# Patient Record
Sex: Female | Born: 1940 | Race: White | Hispanic: No | Marital: Married | Smoking: Never smoker
Health system: Southern US, Community
[De-identification: ages and names within clinical notes are randomized; demographics above are authoritative.]

## PROBLEM LIST (undated history)

## (undated) DIAGNOSIS — Z9889 Other specified postprocedural states: Secondary | ICD-10-CM

## (undated) DIAGNOSIS — Z789 Other specified health status: Secondary | ICD-10-CM

## (undated) DIAGNOSIS — R112 Nausea with vomiting, unspecified: Secondary | ICD-10-CM

## (undated) DIAGNOSIS — T4145XA Adverse effect of unspecified anesthetic, initial encounter: Secondary | ICD-10-CM

## (undated) DIAGNOSIS — M1990 Unspecified osteoarthritis, unspecified site: Secondary | ICD-10-CM

## (undated) DIAGNOSIS — T8859XA Other complications of anesthesia, initial encounter: Secondary | ICD-10-CM

## (undated) DIAGNOSIS — D649 Anemia, unspecified: Secondary | ICD-10-CM

## (undated) DIAGNOSIS — K589 Irritable bowel syndrome without diarrhea: Secondary | ICD-10-CM

## (undated) DIAGNOSIS — I341 Nonrheumatic mitral (valve) prolapse: Secondary | ICD-10-CM

## (undated) DIAGNOSIS — Z923 Personal history of irradiation: Secondary | ICD-10-CM

## (undated) DIAGNOSIS — K219 Gastro-esophageal reflux disease without esophagitis: Secondary | ICD-10-CM

## (undated) DIAGNOSIS — N189 Chronic kidney disease, unspecified: Secondary | ICD-10-CM

## (undated) DIAGNOSIS — I10 Essential (primary) hypertension: Secondary | ICD-10-CM

## (undated) DIAGNOSIS — G2581 Restless legs syndrome: Secondary | ICD-10-CM

## (undated) DIAGNOSIS — Z906 Acquired absence of other parts of urinary tract: Secondary | ICD-10-CM

## (undated) DIAGNOSIS — I2699 Other pulmonary embolism without acute cor pulmonale: Secondary | ICD-10-CM

## (undated) DIAGNOSIS — Z8719 Personal history of other diseases of the digestive system: Secondary | ICD-10-CM

## (undated) DIAGNOSIS — C50919 Malignant neoplasm of unspecified site of unspecified female breast: Secondary | ICD-10-CM

## (undated) DIAGNOSIS — C679 Malignant neoplasm of bladder, unspecified: Secondary | ICD-10-CM

## (undated) HISTORY — DX: Personal history of irradiation: Z92.3

## (undated) HISTORY — PX: COLONOSCOPY: SHX174

## (undated) HISTORY — DX: Gastro-esophageal reflux disease without esophagitis: K21.9

## (undated) HISTORY — PX: REPLACEMENT TOTAL KNEE: SUR1224

## (undated) HISTORY — PX: TONSILLECTOMY: SUR1361

## (undated) HISTORY — PX: APPENDECTOMY: SHX54

## (undated) HISTORY — PX: BLADDER REMOVAL: SHX567

---

## 1978-05-30 HISTORY — PX: ABDOMINAL HYSTERECTOMY: SHX81

## 1983-05-31 DIAGNOSIS — C50919 Malignant neoplasm of unspecified site of unspecified female breast: Secondary | ICD-10-CM

## 1983-05-31 HISTORY — DX: Malignant neoplasm of unspecified site of unspecified female breast: C50.919

## 1998-09-23 ENCOUNTER — Other Ambulatory Visit: Admission: RE | Admit: 1998-09-23 | Discharge: 1998-09-23 | Payer: Self-pay | Admitting: Gynecology

## 1999-12-02 ENCOUNTER — Encounter: Payer: Self-pay | Admitting: Internal Medicine

## 1999-12-02 ENCOUNTER — Encounter: Admission: RE | Admit: 1999-12-02 | Discharge: 1999-12-02 | Payer: Self-pay | Admitting: Internal Medicine

## 1999-12-06 ENCOUNTER — Other Ambulatory Visit: Admission: RE | Admit: 1999-12-06 | Discharge: 1999-12-06 | Payer: Self-pay | Admitting: Internal Medicine

## 2001-03-19 ENCOUNTER — Encounter: Admission: RE | Admit: 2001-03-19 | Discharge: 2001-03-19 | Payer: Self-pay | Admitting: Internal Medicine

## 2001-03-19 ENCOUNTER — Encounter: Payer: Self-pay | Admitting: Internal Medicine

## 2002-06-06 ENCOUNTER — Encounter: Payer: Self-pay | Admitting: Internal Medicine

## 2002-06-06 ENCOUNTER — Encounter: Admission: RE | Admit: 2002-06-06 | Discharge: 2002-06-06 | Payer: Self-pay | Admitting: Internal Medicine

## 2003-05-31 DIAGNOSIS — Z923 Personal history of irradiation: Secondary | ICD-10-CM

## 2003-05-31 HISTORY — PX: BREAST LUMPECTOMY: SHX2

## 2003-05-31 HISTORY — PX: BREAST SURGERY: SHX581

## 2003-05-31 HISTORY — PX: BREAST BIOPSY: SHX20

## 2003-05-31 HISTORY — DX: Personal history of irradiation: Z92.3

## 2003-08-28 ENCOUNTER — Encounter: Admission: RE | Admit: 2003-08-28 | Discharge: 2003-08-28 | Payer: Self-pay | Admitting: Internal Medicine

## 2003-09-04 ENCOUNTER — Encounter: Admission: RE | Admit: 2003-09-04 | Discharge: 2003-09-04 | Payer: Self-pay | Admitting: Internal Medicine

## 2003-09-04 ENCOUNTER — Encounter (INDEPENDENT_AMBULATORY_CARE_PROVIDER_SITE_OTHER): Payer: Self-pay | Admitting: *Deleted

## 2003-09-09 ENCOUNTER — Encounter (HOSPITAL_COMMUNITY): Admission: RE | Admit: 2003-09-09 | Discharge: 2003-12-08 | Payer: Self-pay | Admitting: General Surgery

## 2003-09-18 ENCOUNTER — Encounter: Admission: RE | Admit: 2003-09-18 | Discharge: 2003-09-18 | Payer: Self-pay | Admitting: General Surgery

## 2003-09-22 ENCOUNTER — Encounter (INDEPENDENT_AMBULATORY_CARE_PROVIDER_SITE_OTHER): Payer: Self-pay | Admitting: *Deleted

## 2003-09-22 ENCOUNTER — Ambulatory Visit (HOSPITAL_BASED_OUTPATIENT_CLINIC_OR_DEPARTMENT_OTHER): Admission: RE | Admit: 2003-09-22 | Discharge: 2003-09-22 | Payer: Self-pay | Admitting: General Surgery

## 2003-09-22 ENCOUNTER — Ambulatory Visit (HOSPITAL_COMMUNITY): Admission: RE | Admit: 2003-09-22 | Discharge: 2003-09-22 | Payer: Self-pay | Admitting: General Surgery

## 2003-09-22 ENCOUNTER — Encounter: Admission: RE | Admit: 2003-09-22 | Discharge: 2003-09-22 | Payer: Self-pay | Admitting: General Surgery

## 2003-10-14 ENCOUNTER — Ambulatory Visit: Admission: RE | Admit: 2003-10-14 | Discharge: 2004-01-12 | Payer: Self-pay | Admitting: Radiation Oncology

## 2003-11-03 ENCOUNTER — Encounter: Admission: RE | Admit: 2003-11-03 | Discharge: 2003-11-03 | Payer: Self-pay | Admitting: *Deleted

## 2003-12-24 ENCOUNTER — Encounter: Admission: RE | Admit: 2003-12-24 | Discharge: 2003-12-24 | Payer: Self-pay | Admitting: Oncology

## 2004-02-12 ENCOUNTER — Ambulatory Visit: Admission: RE | Admit: 2004-02-12 | Discharge: 2004-02-12 | Payer: Self-pay | Admitting: Radiation Oncology

## 2004-06-18 ENCOUNTER — Ambulatory Visit: Payer: Self-pay | Admitting: Oncology

## 2004-07-09 ENCOUNTER — Ambulatory Visit: Payer: Self-pay | Admitting: Oncology

## 2004-08-30 ENCOUNTER — Encounter: Admission: RE | Admit: 2004-08-30 | Discharge: 2004-08-30 | Payer: Self-pay | Admitting: Internal Medicine

## 2005-01-20 ENCOUNTER — Ambulatory Visit: Payer: Self-pay | Admitting: Oncology

## 2005-07-07 ENCOUNTER — Ambulatory Visit: Payer: Self-pay | Admitting: Oncology

## 2005-09-01 ENCOUNTER — Encounter: Admission: RE | Admit: 2005-09-01 | Discharge: 2005-09-01 | Payer: Self-pay | Admitting: Oncology

## 2005-12-09 ENCOUNTER — Ambulatory Visit (HOSPITAL_BASED_OUTPATIENT_CLINIC_OR_DEPARTMENT_OTHER): Admission: RE | Admit: 2005-12-09 | Discharge: 2005-12-09 | Payer: Self-pay | Admitting: Orthopedic Surgery

## 2005-12-19 ENCOUNTER — Ambulatory Visit (HOSPITAL_COMMUNITY): Admission: RE | Admit: 2005-12-19 | Discharge: 2005-12-19 | Payer: Self-pay | Admitting: Orthopedic Surgery

## 2005-12-29 ENCOUNTER — Encounter: Admission: RE | Admit: 2005-12-29 | Discharge: 2005-12-29 | Payer: Self-pay | Admitting: Oncology

## 2006-01-04 ENCOUNTER — Ambulatory Visit: Payer: Self-pay | Admitting: Oncology

## 2006-01-06 LAB — COMPREHENSIVE METABOLIC PANEL
AST: 18 U/L (ref 0–37)
Albumin: 4.2 g/dL (ref 3.5–5.2)
Alkaline Phosphatase: 60 U/L (ref 39–117)
Potassium: 4.6 mEq/L (ref 3.5–5.3)
Sodium: 140 mEq/L (ref 135–145)
Total Protein: 7 g/dL (ref 6.0–8.3)

## 2006-01-06 LAB — LACTATE DEHYDROGENASE: LDH: 128 U/L (ref 94–250)

## 2006-01-06 LAB — CBC WITH DIFFERENTIAL/PLATELET
EOS%: 2.7 % (ref 0.0–7.0)
MCH: 32.3 pg (ref 26.0–34.0)
MCV: 94.2 fL (ref 81.0–101.0)
MONO%: 6.1 % (ref 0.0–13.0)
NEUT#: 4.3 10*3/uL (ref 1.5–6.5)
RBC: 4.22 10*6/uL (ref 3.70–5.32)
RDW: 13.8 % (ref 11.3–14.5)

## 2006-06-29 ENCOUNTER — Ambulatory Visit: Payer: Self-pay | Admitting: Oncology

## 2006-07-03 LAB — CBC WITH DIFFERENTIAL/PLATELET
BASO%: 0.3 % (ref 0.0–2.0)
EOS%: 4.7 % (ref 0.0–7.0)
HCT: 37.9 % (ref 34.8–46.6)
LYMPH%: 35.1 % (ref 14.0–48.0)
MCH: 32.2 pg (ref 26.0–34.0)
MCHC: 35.3 g/dL (ref 32.0–36.0)
MONO%: 5.8 % (ref 0.0–13.0)
NEUT%: 54.1 % (ref 39.6–76.8)
Platelets: 405 10*3/uL — ABNORMAL HIGH (ref 145–400)
lymph#: 2.3 10*3/uL (ref 0.9–3.3)

## 2006-07-03 LAB — COMPREHENSIVE METABOLIC PANEL
ALT: 19 U/L (ref 0–35)
AST: 18 U/L (ref 0–37)
Alkaline Phosphatase: 57 U/L (ref 39–117)
Creatinine, Ser: 0.98 mg/dL (ref 0.40–1.20)
Total Bilirubin: 0.4 mg/dL (ref 0.3–1.2)

## 2006-09-04 ENCOUNTER — Encounter: Admission: RE | Admit: 2006-09-04 | Discharge: 2006-09-04 | Payer: Self-pay | Admitting: Oncology

## 2006-09-04 ENCOUNTER — Ambulatory Visit (HOSPITAL_COMMUNITY): Admission: RE | Admit: 2006-09-04 | Discharge: 2006-09-04 | Payer: Self-pay | Admitting: Oncology

## 2007-01-02 ENCOUNTER — Ambulatory Visit: Payer: Self-pay | Admitting: Oncology

## 2007-01-04 LAB — CBC WITH DIFFERENTIAL/PLATELET
BASO%: 0.8 % (ref 0.0–2.0)
EOS%: 8.2 % — ABNORMAL HIGH (ref 0.0–7.0)
LYMPH%: 30.5 % (ref 14.0–48.0)
MCHC: 35.1 g/dL (ref 32.0–36.0)
MCV: 91 fL (ref 81.0–101.0)
MONO%: 3.7 % (ref 0.0–13.0)
Platelets: 374 10*3/uL (ref 145–400)
RBC: 4.24 10*6/uL (ref 3.70–5.32)
RDW: 13.5 % (ref 11.3–14.5)

## 2007-01-04 LAB — COMPREHENSIVE METABOLIC PANEL
ALT: 30 U/L (ref 0–35)
AST: 22 U/L (ref 0–37)
Alkaline Phosphatase: 73 U/L (ref 39–117)
Sodium: 140 mEq/L (ref 135–145)
Total Bilirubin: 0.4 mg/dL (ref 0.3–1.2)
Total Protein: 7 g/dL (ref 6.0–8.3)

## 2007-01-04 LAB — CANCER ANTIGEN 27.29: CA 27.29: 17 U/mL (ref 0–39)

## 2007-05-16 ENCOUNTER — Inpatient Hospital Stay (HOSPITAL_COMMUNITY): Admission: RE | Admit: 2007-05-16 | Discharge: 2007-05-20 | Payer: Self-pay | Admitting: Orthopedic Surgery

## 2007-05-31 HISTORY — PX: BLADDER SURGERY: SHX569

## 2007-07-09 ENCOUNTER — Ambulatory Visit: Payer: Self-pay | Admitting: Oncology

## 2007-07-09 LAB — CBC WITH DIFFERENTIAL/PLATELET
Basophils Absolute: 0 10*3/uL (ref 0.0–0.1)
Eosinophils Absolute: 0.2 10*3/uL (ref 0.0–0.5)
HCT: 38.2 % (ref 34.8–46.6)
HGB: 12.6 g/dL (ref 11.6–15.9)
MCV: 91.2 fL (ref 81.0–101.0)
MONO%: 4.6 % (ref 0.0–13.0)
NEUT#: 3.7 10*3/uL (ref 1.5–6.5)
NEUT%: 62.5 % (ref 39.6–76.8)
Platelets: 537 10*3/uL — ABNORMAL HIGH (ref 145–400)
RDW: 13.7 % (ref 11.3–14.5)

## 2007-07-10 LAB — COMPREHENSIVE METABOLIC PANEL
Albumin: 4.5 g/dL (ref 3.5–5.2)
Alkaline Phosphatase: 72 U/L (ref 39–117)
BUN: 15 mg/dL (ref 6–23)
Calcium: 9.8 mg/dL (ref 8.4–10.5)
Creatinine, Ser: 0.8 mg/dL (ref 0.40–1.20)
Glucose, Bld: 120 mg/dL — ABNORMAL HIGH (ref 70–99)
Potassium: 4 mEq/L (ref 3.5–5.3)

## 2007-07-10 LAB — CANCER ANTIGEN 27.29: CA 27.29: 16 U/mL (ref 0–39)

## 2007-07-10 LAB — VITAMIN D 25 HYDROXY (VIT D DEFICIENCY, FRACTURES): Vit D, 25-Hydroxy: 33 ng/mL (ref 30–89)

## 2007-09-05 ENCOUNTER — Encounter: Admission: RE | Admit: 2007-09-05 | Discharge: 2007-09-05 | Payer: Self-pay | Admitting: Oncology

## 2007-12-31 ENCOUNTER — Encounter: Admission: RE | Admit: 2007-12-31 | Discharge: 2007-12-31 | Payer: Self-pay | Admitting: Oncology

## 2007-12-31 ENCOUNTER — Ambulatory Visit: Payer: Self-pay | Admitting: Oncology

## 2008-01-02 LAB — CBC WITH DIFFERENTIAL/PLATELET
BASO%: 0.4 % (ref 0.0–2.0)
LYMPH%: 29.7 % (ref 14.0–48.0)
MCHC: 34.3 g/dL (ref 32.0–36.0)
MCV: 92.8 fL (ref 81.0–101.0)
MONO%: 4.3 % (ref 0.0–13.0)
Platelets: 376 10*3/uL (ref 145–400)
RBC: 4.12 10*6/uL (ref 3.70–5.32)
RDW: 13.5 % (ref 11.3–14.5)
WBC: 5.8 10*3/uL (ref 3.9–10.0)

## 2008-01-03 LAB — COMPREHENSIVE METABOLIC PANEL
ALT: 36 U/L — ABNORMAL HIGH (ref 0–35)
AST: 27 U/L (ref 0–37)
Albumin: 4.2 g/dL (ref 3.5–5.2)
Alkaline Phosphatase: 68 U/L (ref 39–117)
Potassium: 4.3 mEq/L (ref 3.5–5.3)
Sodium: 142 mEq/L (ref 135–145)
Total Bilirubin: 0.4 mg/dL (ref 0.3–1.2)
Total Protein: 7.1 g/dL (ref 6.0–8.3)

## 2008-01-31 ENCOUNTER — Encounter: Payer: Self-pay | Admitting: Urology

## 2008-01-31 ENCOUNTER — Ambulatory Visit (HOSPITAL_BASED_OUTPATIENT_CLINIC_OR_DEPARTMENT_OTHER): Admission: RE | Admit: 2008-01-31 | Discharge: 2008-02-01 | Payer: Self-pay | Admitting: Urology

## 2008-02-21 ENCOUNTER — Ambulatory Visit (HOSPITAL_BASED_OUTPATIENT_CLINIC_OR_DEPARTMENT_OTHER): Admission: RE | Admit: 2008-02-21 | Discharge: 2008-02-21 | Payer: Self-pay | Admitting: Urology

## 2008-02-21 ENCOUNTER — Encounter: Payer: Self-pay | Admitting: Urology

## 2008-04-21 ENCOUNTER — Encounter: Payer: Self-pay | Admitting: Urology

## 2008-04-21 ENCOUNTER — Inpatient Hospital Stay (HOSPITAL_COMMUNITY): Admission: RE | Admit: 2008-04-21 | Discharge: 2008-04-28 | Payer: Self-pay | Admitting: Urology

## 2008-06-30 ENCOUNTER — Ambulatory Visit: Payer: Self-pay | Admitting: Oncology

## 2008-07-02 LAB — COMPREHENSIVE METABOLIC PANEL
ALT: 30 U/L (ref 0–35)
AST: 19 U/L (ref 0–37)
Albumin: 4.2 g/dL (ref 3.5–5.2)
BUN: 25 mg/dL — ABNORMAL HIGH (ref 6–23)
CO2: 19 mEq/L (ref 19–32)
Calcium: 9.4 mg/dL (ref 8.4–10.5)
Chloride: 105 mEq/L (ref 96–112)
Creatinine, Ser: 0.99 mg/dL (ref 0.40–1.20)
Potassium: 4.3 mEq/L (ref 3.5–5.3)

## 2008-07-02 LAB — CBC WITH DIFFERENTIAL/PLATELET
Basophils Absolute: 0 10*3/uL (ref 0.0–0.1)
EOS%: 5.7 % (ref 0.0–7.0)
HCT: 34 % — ABNORMAL LOW (ref 34.8–46.6)
HGB: 11.4 g/dL — ABNORMAL LOW (ref 11.6–15.9)
MCH: 30.3 pg (ref 26.0–34.0)
MONO#: 0.5 10*3/uL (ref 0.1–0.9)
NEUT#: 5 10*3/uL (ref 1.5–6.5)
NEUT%: 62 % (ref 39.6–76.8)
RDW: 15.6 % — ABNORMAL HIGH (ref 11.3–14.5)
WBC: 8.1 10*3/uL (ref 3.9–10.0)
lymph#: 2.1 10*3/uL (ref 0.9–3.3)

## 2008-07-02 LAB — LACTATE DEHYDROGENASE: LDH: 106 U/L (ref 94–250)

## 2008-09-05 ENCOUNTER — Encounter: Admission: RE | Admit: 2008-09-05 | Discharge: 2008-09-05 | Payer: Self-pay | Admitting: Oncology

## 2008-09-08 ENCOUNTER — Encounter: Admission: RE | Admit: 2008-09-08 | Discharge: 2008-09-08 | Payer: Self-pay | Admitting: Oncology

## 2008-12-29 ENCOUNTER — Ambulatory Visit: Payer: Self-pay | Admitting: Oncology

## 2008-12-31 LAB — CBC WITH DIFFERENTIAL/PLATELET
Basophils Absolute: 0 10*3/uL (ref 0.0–0.1)
EOS%: 5.5 % (ref 0.0–7.0)
Eosinophils Absolute: 0.4 10*3/uL (ref 0.0–0.5)
HGB: 11.8 g/dL (ref 11.6–15.9)
LYMPH%: 26.7 % (ref 14.0–49.7)
MCH: 31.3 pg (ref 25.1–34.0)
MCV: 91.8 fL (ref 79.5–101.0)
MONO%: 3.6 % (ref 0.0–14.0)
Platelets: 484 10*3/uL — ABNORMAL HIGH (ref 145–400)
RDW: 13.6 % (ref 11.2–14.5)

## 2009-01-01 LAB — COMPREHENSIVE METABOLIC PANEL
AST: 14 U/L (ref 0–37)
Albumin: 4.2 g/dL (ref 3.5–5.2)
Alkaline Phosphatase: 73 U/L (ref 39–117)
BUN: 27 mg/dL — ABNORMAL HIGH (ref 6–23)
Creatinine, Ser: 1.21 mg/dL — ABNORMAL HIGH (ref 0.40–1.20)
Glucose, Bld: 129 mg/dL — ABNORMAL HIGH (ref 70–99)
Potassium: 4.7 mEq/L (ref 3.5–5.3)
Total Bilirubin: 0.3 mg/dL (ref 0.3–1.2)

## 2009-01-01 LAB — CANCER ANTIGEN 27.29: CA 27.29: 22 U/mL (ref 0–39)

## 2009-01-01 LAB — VITAMIN D 25 HYDROXY (VIT D DEFICIENCY, FRACTURES): Vit D, 25-Hydroxy: 50 ng/mL (ref 30–89)

## 2009-03-26 ENCOUNTER — Ambulatory Visit (HOSPITAL_COMMUNITY): Admission: RE | Admit: 2009-03-26 | Discharge: 2009-03-26 | Payer: Self-pay | Admitting: Urology

## 2009-07-07 ENCOUNTER — Ambulatory Visit (HOSPITAL_BASED_OUTPATIENT_CLINIC_OR_DEPARTMENT_OTHER): Payer: MEDICARE | Admitting: Oncology

## 2009-07-09 LAB — CBC WITH DIFFERENTIAL/PLATELET
BASO%: 0.5 % (ref 0.0–2.0)
Basophils Absolute: 0 10*3/uL (ref 0.0–0.1)
EOS%: 5.3 % (ref 0.0–7.0)
HCT: 36.9 % (ref 34.8–46.6)
HGB: 12.5 g/dL (ref 11.6–15.9)
LYMPH%: 31.7 % (ref 14.0–49.7)
MCH: 31.6 pg (ref 25.1–34.0)
MCHC: 33.8 g/dL (ref 31.5–36.0)
MCV: 93.3 fL (ref 79.5–101.0)
NEUT%: 55.8 % (ref 38.4–76.8)
Platelets: 357 10*3/uL (ref 145–400)
lymph#: 2.3 10*3/uL (ref 0.9–3.3)

## 2009-07-09 LAB — COMPREHENSIVE METABOLIC PANEL
AST: 15 U/L (ref 0–37)
BUN: 27 mg/dL — ABNORMAL HIGH (ref 6–23)
Calcium: 9.5 mg/dL (ref 8.4–10.5)
Chloride: 103 mEq/L (ref 96–112)
Creatinine, Ser: 1.02 mg/dL (ref 0.40–1.20)
Total Bilirubin: 0.2 mg/dL — ABNORMAL LOW (ref 0.3–1.2)

## 2009-07-09 LAB — LACTATE DEHYDROGENASE: LDH: 104 U/L (ref 94–250)

## 2009-07-30 ENCOUNTER — Ambulatory Visit (HOSPITAL_COMMUNITY): Admission: RE | Admit: 2009-07-30 | Discharge: 2009-07-30 | Payer: Self-pay | Admitting: Urology

## 2009-09-07 ENCOUNTER — Encounter: Admission: RE | Admit: 2009-09-07 | Discharge: 2009-09-07 | Payer: Self-pay | Admitting: Oncology

## 2009-11-03 ENCOUNTER — Ambulatory Visit (HOSPITAL_COMMUNITY): Admission: RE | Admit: 2009-11-03 | Discharge: 2009-11-03 | Payer: Self-pay | Admitting: Urology

## 2010-06-20 ENCOUNTER — Encounter: Payer: Self-pay | Admitting: General Surgery

## 2010-07-05 ENCOUNTER — Encounter: Payer: MEDICARE | Admitting: Oncology

## 2010-07-05 DIAGNOSIS — C679 Malignant neoplasm of bladder, unspecified: Secondary | ICD-10-CM

## 2010-07-05 LAB — CBC WITH DIFFERENTIAL/PLATELET
Eosinophils Absolute: 0.1 10*3/uL (ref 0.0–0.5)
HCT: 37.6 % (ref 34.8–46.6)
MCH: 30.9 pg (ref 25.1–34.0)
MCHC: 33.8 g/dL (ref 31.5–36.0)
MCV: 91.5 fL (ref 79.5–101.0)
NEUT#: 4.5 10*3/uL (ref 1.5–6.5)
NEUT%: 65.2 % (ref 38.4–76.8)
Platelets: 344 10*3/uL (ref 145–400)
RBC: 4.11 10*6/uL (ref 3.70–5.45)
RDW: 14 % (ref 11.2–14.5)
WBC: 6.9 10*3/uL (ref 3.9–10.3)
lymph#: 1.8 10*3/uL (ref 0.9–3.3)

## 2010-07-06 LAB — COMPREHENSIVE METABOLIC PANEL
AST: 19 U/L (ref 0–37)
Albumin: 4.1 g/dL (ref 3.5–5.2)
BUN: 21 mg/dL (ref 6–23)
Calcium: 9.5 mg/dL (ref 8.4–10.5)
Chloride: 106 mEq/L (ref 96–112)
Sodium: 139 mEq/L (ref 135–145)
Total Protein: 6.9 g/dL (ref 6.0–8.3)

## 2010-07-06 LAB — VITAMIN D 25 HYDROXY (VIT D DEFICIENCY, FRACTURES): Vit D, 25-Hydroxy: 29 ng/mL — ABNORMAL LOW (ref 30–89)

## 2010-07-06 LAB — CANCER ANTIGEN 27.29: CA 27.29: 20 U/mL (ref 0–39)

## 2010-07-16 ENCOUNTER — Other Ambulatory Visit: Payer: Self-pay | Admitting: Oncology

## 2010-07-16 ENCOUNTER — Encounter (HOSPITAL_BASED_OUTPATIENT_CLINIC_OR_DEPARTMENT_OTHER): Payer: MEDICARE | Admitting: Oncology

## 2010-07-16 DIAGNOSIS — C679 Malignant neoplasm of bladder, unspecified: Secondary | ICD-10-CM

## 2010-07-16 DIAGNOSIS — C50319 Malignant neoplasm of lower-inner quadrant of unspecified female breast: Secondary | ICD-10-CM

## 2010-07-16 DIAGNOSIS — Z9889 Other specified postprocedural states: Secondary | ICD-10-CM

## 2010-08-05 ENCOUNTER — Ambulatory Visit (HOSPITAL_COMMUNITY)
Admission: RE | Admit: 2010-08-05 | Discharge: 2010-08-05 | Disposition: A | Discharge status: Home / Self Care | Payer: MEDICARE | Source: Ambulatory Visit | Attending: Urology | Admitting: Urology

## 2010-08-05 ENCOUNTER — Other Ambulatory Visit: Payer: Self-pay | Admitting: Urology

## 2010-08-05 DIAGNOSIS — Z8551 Personal history of malignant neoplasm of bladder: Secondary | ICD-10-CM | POA: Insufficient documentation

## 2010-08-05 DIAGNOSIS — C679 Malignant neoplasm of bladder, unspecified: Secondary | ICD-10-CM

## 2010-08-05 DIAGNOSIS — Z79899 Other long term (current) drug therapy: Secondary | ICD-10-CM | POA: Insufficient documentation

## 2010-08-05 DIAGNOSIS — Z85528 Personal history of other malignant neoplasm of kidney: Secondary | ICD-10-CM | POA: Insufficient documentation

## 2010-08-05 DIAGNOSIS — I10 Essential (primary) hypertension: Secondary | ICD-10-CM | POA: Insufficient documentation

## 2010-09-14 ENCOUNTER — Ambulatory Visit
Admission: RE | Admit: 2010-09-14 | Discharge: 2010-09-14 | Disposition: A | Discharge status: Home / Self Care | Payer: MEDICARE | Source: Ambulatory Visit | Attending: Oncology | Admitting: Oncology

## 2010-09-14 DIAGNOSIS — Z9889 Other specified postprocedural states: Secondary | ICD-10-CM

## 2010-10-12 NOTE — Discharge Summary (Signed)
NAME:  ILO, BEAMON                    ACCOUNT NO.:  [id number]   MEDICAL RECORD NO.:  [id number]          PATIENT TYPE:  INP   LOCATION:  5007                         FACILITY:  [facility]   PHYSICIAN:  Harvie Junior, M.D.   DATE OF BIRTH:  1941-03-23   DATE OF ADMISSION:  05/16/2007  DATE OF DISCHARGE:  05/20/2007                               DISCHARGE SUMMARY   ADMISSION DIAGNOSES:  1. End-stage degenerative joint disease, left knee.  2. Hypertension.  3. History of mitral valve prolapse.  4. History of breast cancer in 1985.   DISCHARGE DIAGNOSES:  1. End-stage degenerative joint disease, left knee.  2. Hypertension.  3. History of mitral valve prolapse.  4. History of breast cancer in 1985.   BRIEF HISTORY:  Ms. An is a pleasant 70 year old female with a long  history of left knee pain.  She has pain with ambulation and night pain.  X-rays of the left knee show that she has bone-on-bone degenerative  arthritis with spurring.  She only got temporary relief with cortisone  injections, modification of activities and use of medication.  She had  continued difficulty with activities of daily living and based upon her  clinical and radiographic findings was felt to be a candidate for a left  total knee arthroplasty.  She was admitted for this.   PROCEDURES IN THE HOSPITAL:  Left total knee arthroplasty, computer-  assisted, Jodi Geralds MD, May 16, 2007.   PERTINENT LABORATORY STUDIES:  The patient's EKG on admission showed  normal sinus rhythm, consider septal infarct, age undetermined,  otherwise normal EKG.  No significant change compared with the previous  EKG of December 08, 2005.  Preoperative chest x-ray showed no acute disease.  Hemoglobin on admission was 14.2, hematocrit 41.2, indices within normal  limits.  On postop day #1, hemoglobin was 11, #2 10.8, #3 10.4.  Pro  time on admission was 13.3 with an INR of 1.0, PTT was 36.  On the day  of discharge on Coumadin, her INR  was 2.4 with a pro time of 26.8  seconds.  CMET on admission showed no abnormalities.  BMET on postop day  #1 was also within normal limits other than slightly elevated glucose of  185.  Urinalysis showed few epithelials and small leukocyte esterase,  with cloudy appearance, 0-3 wbc's and rbc's per high-powered field.   HOSPITAL COURSE:  The patient underwent left total knee arthroplasty as  well-described in Dr. Luiz Blare' operative note.  Preoperatively she was  given IV gentamicin 80 mg and IV Ancef 1 g.  Postoperatively she given 1  g of Ancef q.8 h. x3 doses prophylactically.  She was started on a PCA  morphine pump for pain control and physical therapy ordered for walker  ambulation, weightbearing as tolerated on the left.  CPM machine was  used for range of motion.  The Foley catheter that had been placed at  the time of surgery was left intact.  On postop day #1 she had good  relief of pain from her femoral nerve block which had  been done  preoperatively, her vital signs were stable and she was afebrile.  Her  laboratory data looked great with a stable hemoglobin of 11.1 and INR of  1.0.  She was gotten out of bed with physical therapy, weightbearing as  tolerated on the left side.  She continued with the CPM machine as well  for knee range of motion.  On postop day #2 she had some complaints of  knee pain.  She was taking fluids and voiding without difficulty.  Her  Foley catheter had been removed.  Her hemoglobin was 10.8.  Her vital  signs were stable and her temperature was 99.5.  Her INR was 1.5.  her  IV was converted to a saline lock.  Her PCA morphine pump was  discontinued.  Her dressing was changed.  Hemovac drain was pulled  without complications.  On postop day #3 she still had complaints of  some moderate knee pain.  She did not do as well in physical therapy  that we felt she could be discharged home, but she did make good  progress on that day.  On May 20, 2007,  she was doing well, her  pain was well-controlled, her vital signs were stable, she is afebrile,  her hemoglobin was 10.4, her INR was 2.4.  Her wound was benign, her  calf was soft, NV was intact to her left lower extremity.   CONDITION ON DISCHARGE:  Improved.   ACTIVITY:  Passively weightbearing as tolerated on the left with a  walker.  Seeing home health physical therapy three times a week for  walker ambulation and knee range of motion.  CPM machine for knee range  of motion as well, 8 hours per 24 hours, and home health RN for pro  times and Coumadin management x1 month postop for DVT prophylaxis.   She is given a prescription for:  1. Percocet 5 mg one to take q.6 h. p.r.n. pain.  2. Robaxin 500 mg one q.8 h. p.r.n. spasm.  3. Coumadin per pharmacy protocol x1 month.  4. Postop she will also continue on her usual medications.   Her saline lock was discontinued prior to discharge.  She will follow up  Dr. Luiz Blare in the office 10 days, sooner if any problems occur.      Marshia Ly, P.A.      Harvie Junior, M.D.  Electronically Signed    JB/MEDQ  D:  07/16/2007  T:  07/17/2007  Job:  [id number]   cc:   Cassell Clement, M.D.  Larina Earthly, M.D.

## 2010-10-12 NOTE — Op Note (Signed)
NAME:  Caitlin [name], Caitlin [name]                    ACCOUNT NO.:  [id number]   MEDICAL RECORD NO.:  [id number]          PATIENT TYPE:  INP   LOCATION:  0001                         FACILITY:  [facility]   PHYSICIAN:  Bertram Millard. Dahlstedt, M.D.DATE OF BIRTH:  1940/10/06   DATE OF PROCEDURE:  04/21/2008  DATE OF DISCHARGE:                               OPERATIVE REPORT   PREOPERATIVE DIAGNOSIS:  Muscle invasive bladder cancer, sarcomatoid  variant.   POSTOPERATIVE DIAGNOSIS:  Muscle invasive bladder cancer, sarcomatoid  variant.   PRINCIPAL PROCEDURE:  Creation of ileal neobladder   CO-SURGEON:  Excell Seltzer. Annabell Howells, M.D.   ANESTHESIA:  General endotracheal.   COMPLICATIONS:  None.   ESTIMATED BLOOD LOSS:  350 mL.   SPECIMENS:  None.   BRIEF HISTORY:  This lady is undergoing a radical cystectomy, bilateral  pelvic lymph node dissection and creation of urinary diversion.  She  prefers a neobladder as her form of permanent urinary diversion.  She  does have normal renal function.  She has had a TURBT plus rebiopsy of  the base of the tumor.  She had a carcinoma with sarcomatoid variant.  There is no evidence of metastatic spread.  She presents at this time  for the cystectomy.  She is aware of risks and complications of the  cystectomy and possible neobladder, including infection, bleeding, leak  of urine, stricture of the ureters, incontinence, retention, DVT, PE,  infections, among others.  She understands and she desires to proceed.   DESCRIPTION OF PROCEDURE:  The patient had previously undergone the  cystectomy, preservation of vagina and bilateral pelvic lymph node  dissection.  This was dictated separately by Dr. Bjorn Pippin.   Following completion of the above-mentioned procedure, and the  neobladder was performed.  A cuff of the bladder neck was left, with a  margin showing no evidence of cancer.  Then 60 cm of the distal ileum  was measured, and the mesentery was transected at the proximal  and  distal extents of the 60 - cm  ileal segment.  The ileum was then  divided with the GIA stapler.  The remaining ileum was then  reanastomosed above the ileum to be used as the neobladder, using the  GIA and TA stapler, in a side-to-side functional end-to-end fashion.  The mesenteric window was closed with 3-0 pop-off silk sutures.   The ileum to be used as the neobladder was then inspected.  The  antimesenteric border was divided from proximal to distal extents, and  the stapled end was resected.  Then 15 cm from the end of the ileal  segment, the neobladder was created by running 3-0 PDS suture, creating  a tubular neobladder - like tubular neo-urethra, both in the anterior  and posterior extents of this segment.  That left 15 cm on the distal  side, 45 cm of the ileum on the proximal side.  The proximal side was  then spiraled around to form the posterior plate of the neobladder.  Two  suture lines were then run, closing the opposing sides with 3-0 PDS  placed in a simple running fashion.  This created the entire posterior  plate of the neobladder.   The ureters were then identified, spatulated, and brought through the  mid-aspect of the posterior lateral edges of the respective neobladder.  They were sutured to the internal aspect of the mucosal edge of the  neobladder using 4-0 PDS placed in a simple interrupted fashion  circumferentially.  This left wide - open anastomoses, and a 7- French  x 24 cm contour stent was passed up into the renal pelves bilaterally,  with the end of the stent being in the neobladder.  The strings were  left on the end for eventual suturing to the Foley catheter.  The  anastomotic sutures that had been previously placed at the bladder neck,  and five different sutures, were then brought through the bottom edge of  the neo-urethra, placed circumferentially in the corresponding spots.  A  24-French hematuria catheter was then brought through the  urethra, the  balloon filled with 20 mL of water.  The strings from the stents were  tied to the holes of the catheter.  The anterior plate of the neobladder  was then formed, with the superior edge of the neobladder flipped down.   The neobladder was then closed using in 2 running sutures from the from  the midline running laterally of 3-0 PDS.  Care was taken not to injure  the balloon from the catheter or to hook the sutures from the stent.  Following closure of the neobladder, approximately 100 mL of saline was  placed up through the catheter.  No leakage was seen.  The anastomotic  sutures were then tied and cut.  Inspection of the pelvis revealed  adequate hemostasis.  The redundant omentum was then divided in the  midline, and then brought down the left paracolic gutter and placed  behind the neobladder.  A drain was then placed anterior to the  neobladder through a separate stab incision in the right lower quadrant.  It was sutured to the skin.  The pelvis was irrigated.  Sponge counts  were correct at this point, before closure of the wound.  The fascia was  reapproximated using #1 PDS placed in a simple running fashion.  Skin  edges were reapproximated using skin clips.   At this point the procedure was terminated.  The patient was awakened  and taken to PACU in stable condition.  Sponge, needle, and instrument  counts were correct x2.  Estimated blood loss was 350 mL.      Bertram Millard. Dahlstedt, M.D.  Electronically Signed     SMD/MEDQ  D:  04/21/2008  T:  04/21/2008  Job:  [id number]

## 2010-10-12 NOTE — Op Note (Signed)
NAME:  Caitlin [name], Caitlin [name]                    ACCOUNT NO.:  [id number]   MEDICAL RECORD NO.:  [id number]          PATIENT TYPE:  INP   LOCATION:  1234                         FACILITY:  [facility]   PHYSICIAN:  Excell Seltzer. Annabell Howells, M.D.    DATE OF BIRTH:  1940/08/18   DATE OF PROCEDURE:  04/21/2008  DATE OF DISCHARGE:                               OPERATIVE REPORT   PROCEDURE:  Radical cystectomy with bilateral lymphadenectomy.   PREOPERATIVE DIAGNOSIS:  Bladder cancer.   POSTOPERATIVE DIAGNOSIS:  Bladder cancer.   SURGEON:  Dr. Bjorn Pippin.   ASSISTANT:  Dr. Bertram Millard. Dahlstedt.   ANESTHESIA:  General.   SPECIMEN:  Bladder and right and left pelvic lymph nodes.   ESTIMATED BLOOD LOSS:  350 mL.   DRAIN:  A #10 flat Blake drain and Foley catheter with bilateral  ureteral stents.   COMPLICATIONS:  None.   INDICATIONS:  Ms. 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  Invasion and was not apparent and a re-  resection was performed which revealed no residual tumor.  However,  sarcomatoid variant bladder cancer has a very aggressive history and  after review with the patient and a second opinion at [facility], we  elected to proceed with cystectomy with bilateral pelvic  lymphadenectomy.  She has seen Dr. Retta Diones about diversion options,  and he will perform a neobladder if possible at the completion of the  cystectomy.   FINDINGS AND PROCEDURE:  The patient was given preoperative bowel prep  with erythromycin base and neomycin and Golytely.  On the day of surgery  she was fitted with PAS hose and given Ancef.  She was taken operating  room where general anesthetic was induced.  She was placed in the low  lithotomy position.  Her lower abdomen was shaved.  She was prepped both  abdominally and vaginally with Betadine and then draped in the usual  sterile fashion.  A 20-French Foley catheter  was inserted.  The balloon  was filled with 10 mL of sterile fluid and the bladder was drained.  A  lower midline incision was made from the pubis to just lateral to the  umbilicus on the left.  She had been marked for stoma on the right if  needed.  The incision was carried down through the subcutaneous tissue  and fascia using the Bovie.  She did have a prior Pfannenstiel and that  scar was encountered in the lower margin of the wound.  Once the rectus  muscle was identified the midline was noted.  The posterior sheath that  was then divided and the peritoneal cavity was opened.  The urachal  remnant was excised from the umbilicus along with its overlying  peritoneum down to the lateral aspects of the bladder.  This was grasped  with a Tresa Endo and used to provide traction on the bladder.  The  peritoneum overlying the bladder was incised posteriorly on both sides  down to the level of the  ureters which were identified.  First on the  left, the ureter was dissected out and clipped near the detrusor hiatus  with large clips and a section was sent for frozen section analysis.  This returned negative.  The right ureter was then dissected out and  clipped in identical fashion, once again with a small specimen being  sent for frozen section analysis which returned negative.  The  peritoneum was then incised transversely after the vaginal vault had  been identified using a sponge stick with a sponge.  Because of the  desire to preserve the bladder neck and reduce the risk of fistula  formation, the bladder was dissected off the anterior vaginal wall.  The  lateral pedicles were taken down using the LigaSure and the bladder was  gradually dissected off the vaginal vault down to the level of the  bladder neck and proximal urethra.  The pubourethral ligaments were then  divided using the Bovie, further freeing the bladder neck.  Once the  urethra was felt to be sufficiently circumscribed at the  level of the  bladder neck it was divided.  The specimen was removed.  The bladder  neck margin was analyzed by frozen section and was found to be negative.   Once the bladder was removed, some venous bleeding in the pelvis was  controlled with cautery and we turned our attention to lymph node  dissection.  During the bladder dissection, there was a 3-cm rent in the  right side of the anterior vaginal wall that was identified.  This was  closed using a running 2-0 Vicryl.  The vaginal repair was tested with  vaginal irrigation without leakage.   The right lymph node dissection was performed with the margins of the  dissection being the lateral margin of the external iliac vein, the  obturator nerve, the circumflex iliac vein and the bifurcation of the  hypogastric artery.  We did take the dissection somewhat more proximal  than just the limited by obturator node dissection.  The proximal end of  the lymph node packet was controlled with LigaSure.  The packet was sent  for permanent section.  Once the right packet had been dissected out,  the left packet was removed in an identical fashion, once again with the  same limits and care being taken to avoid injury to the obturator nerve.  No obvious gross nodal involvement was noted.   Once the lymph node dissection had been performed, Dr. Retta Diones created  the neobladder along with the ureteral anastomoses and urethral  anastomosis.  Once this had been completed, the wound was irrigated and  inspected.  No significant bleeding was noted.  A #10 flat Blake drain  was placed through a separate stab wound lateral to the right side of  the incision.  A 24-French Simplastic Foley had been used with the  neobladder along with bilateral 7-French 26 cm double-J stents which  were secured to the Foley.  The rectus fascia was then closed using a  running #1 PDS.  The subcutaneous tissues were irrigated.  The skin was  closed with clips.  The drain  was placed to bulb suction, the Foley to  straight drainage.  A dressing was applied.  The patient's was taken  down from lithotomy position.  Her anesthetic was reversed.  She was  moved to the recovery room in stable condition.      Excell Seltzer. Annabell Howells, M.D.  Electronically Signed     JJW/MEDQ  D:  04/21/2008  T:  04/21/2008  Job:  [id number]   cc:   Pierce Crane, MD  Fax: [phone]   Larina Earthly, M.D.  Fax: [phone]

## 2010-10-12 NOTE — Op Note (Signed)
NAME:  Caitlin [name], Caitlin [name]                    ACCOUNT NO.:  [id number]   MEDICAL RECORD NO.:  [id number]          PATIENT TYPE:  AMB   LOCATION:  NESC                         FACILITY:  [facility]   PHYSICIAN:  Excell Seltzer. Annabell Howells, M.D.    DATE OF BIRTH:  07/31/1940   DATE OF PROCEDURE:  01/31/2008  DATE OF DISCHARGE:                               OPERATIVE REPORT   PROCEDURE:  Cystoscopy, transurethral resection of large bladder tumor,  exam under anesthesia.   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   SURGEON:  Excell Seltzer. Annabell Howells, M.D.   ANESTHESIA:  General.   SPECIMEN:  1. Bladder tumor.  2. Base of bladder tumor.   DRAINS:  20 French Foley catheter.   COMPLICATIONS:  None.   INDICATIONS:  Ms. Stensland is a 70 year old white female who presented with  voiding symptoms and hematuria.  She was found to have a 3.9 x 3 cm  bladder mass on CT scan.  This was confirmed by cystoscopy as a probable  bladder tumor.  She also had a palpable mass on exam.  She presents  today for resection.   FINDINGS AND PROCEDURE:  The patient was given Cipro.  She was fitted  with PAS hose.  She was taken to the operating room where general  anesthetic was induced.  She was placed in lithotomy position.  Her  perineum and genitalia were prepped with Betadine solution.  She was  draped in the usual sterile fashion.   A 28 French continuous flow resectoscope sheath was inserted, fitted  with a 12 degree lens, Iglesias handle and 24 [place] loop.  Inspection  revealed unremarkable ureteral orifices although lateral to the right  ureteral orifice was some erythema that was also present on the right  bladder neck.  It appeared to be more inflammatory from the local  effects of her large bladder tumor.  The left ureteral orifice was  unremarkable.  However,  there was a small papillary tumor just lateral  to the orifice.  There was also a small papillary tumor on the posterior  wall but on the left lateral wall there was the large tumor noted on CT  scan that had a coating of necrotic material with probable calcium  deposits.  At the base the tumor there was more of papillary component  which made the diameter of the tumor in total greater than 5 cm.   Once thorough inspection had been performed the bulk of the tumor was  resected.  It was quite mobile in the bladder and difficult to grasp  with a resectoscope.  Eventually I was able to free it from the base of  the bladder but I was unable to remove it because the tissue was quite  firm.  I then pinned it to the base of the bladder and resected it  sufficiently that I was able to remove it with a Toomey syringe.  Once  the  bulk of the tumor had been removed and placed in a specimen  container I then turned my attention to the residual tumor at the base.  The base of the tumor and the surrounding papillary lesions were  resected down to bladder wall muscle.  The stalk unfortunately was  located right over the obturator nerve so aggressive resection in this  area was not possible.  However, prior to initiating the resection of  the base a bimanual exam was performed and no residual mass was noted.  I did see bladder wall musculature around the area of the stalk with  some char over the central component.  It did not have the appearance of  residual tumor.  The final resection site on the bladder wall was  approximately 6 cm x 3 cm.  At this point hemostasis was achieved and  the base chips were removed and placed in a specimen container.  I did  fulgurate the erythematous area adjacent to the right ureteral orifice.  An attempt to take a TUR biopsy resulted in charred tissue that would  not be sufficient for biopsy.  The bladder neck tissue on the right was  less impressive and was not  fulgurated as I felt that this was just a  local irritative effect from the large tumor resting in that area.  At  this point the inspection revealed intact ureteral orifices.  No  retained chips or active bleeding.  The resectoscope was removed and a  20 [place] Foley catheter was inserted and balloon was filled with 10 mL  sterile fluid and placed to straight drainage.   Because of the extent of the tumor and the final impression that it did  not appear to be likely to be muscle invasive I felt that mitomycin C  instillation would be appropriate and found that we now have it and it  is no longer on back order.  So 40 mg of mitomycin C was ordered in 40  mL of diluent and was instilled in the PACU and left indwelling for 30  minutes.  There were no complications during the procedure.      Excell Seltzer. Annabell Howells, M.D.  Electronically Signed     JJW/MEDQ  D:  01/31/2008  T:  01/31/2008  Job:  [id number]   cc:   Larina Earthly, M.D.  Fax: [phone]   Pierce Crane, MD  Fax: [phone]

## 2010-10-12 NOTE — Op Note (Signed)
NAME:  Caitlin [name], Caitlin [name]                    ACCOUNT NO.:  [id number]   MEDICAL RECORD NO.:  [id number]          PATIENT TYPE:  INP   LOCATION:  2550                         FACILITY:  [facility]   PHYSICIAN:  Harvie Junior, M.D.   DATE OF BIRTH:  04-30-1941   DATE OF PROCEDURE:  05/16/2007  DATE OF DISCHARGE:                               OPERATIVE REPORT   PREOPERATIVE DIAGNOSIS:  End stage degenerative joint disease, left  knee.   POSTOPERATIVE DIAGNOSIS:  End stage degenerative joint disease, left  knee.   PRINCIPAL PROCEDURE:  1. Left total knee replacement with Sigma system, size 2 femur, size 2      tibia, 10-mm bridging bearing and a 32 mm all polyethylene patella.  2. Computer-assisted left total knee replacement.   SURGEON:  Harvie Junior, M.D.   ASSISTANT:  Marshia Ly, P.A.   ANESTHESIA:  General.   BRIEF HISTORY:  Ms.  [name] is a 70 year old female with a long history of  having had significant left knee pain.  She had a open meniscectomy  performed many years ago and has just continued to wear that lateral  side every since that surgical procedure.  She presented in significant  valgus alignment.  We felt that because of her valgus alignment that it  would be appropriate to use computer assisted alignment so we ultimately  can make her alignment neutral in the long axis and she is brought to  the operating room for this procedure.   PROCEDURE:  The patient brought to operating room.  After adequate  general anesthesia obtained with general anesthetic, the patient was  placed supine on operating table.  Left leg was prepped draped usual  sterile fashion.  Following this a midline incision was made,  subcutaneous tissues dissected down to level the extensor mechanism.  Medial parapatellar arthrotomy was undertaken and the medial and lateral  meniscus were removed.  The anterior posterior cruciates were removed.  The retropatellar fat pad was removed.  At this point the case  was  halted to put in the computer assistance modules.  Two pins in the tibia  percutaneously, two pins in the femur within the wound and then the  alignment modules were set on both the femur and the tibia.  Following  this the registration process undertaken and adds about 20 minutes to  surgical procedure.  Following this the tibia was cut perpendicular to  its long axis under computer assistance alignment and once this was  accomplished, the femur was cut perpendicular to the mechanical axis  under the computer assisted alignment.  Once this completed, a lollipop  guide was put in place and excellent perfect neutral long alignment was  achieved along with good gap balance.  At this point attention was  turned towards using the cutting block so the rotational alignment set  off the posterior condyles again with computer assistance as a monitor  check.  At this point once it was completed, the anterior posterior cuts  were made as well as the chamfer cuts.  The flexion gap was  checked with  a spacer block prior to making the posterior cuts.  Once this was  completed attention was turned towards the box cutting guide where the  box cutting alignment guide is used to cut the central portion of the  box.  The tibia is then exposed and a size 2 was the appropriate sized.  It was rotationally aligned and pinned in place and once that was  completed, the keel was drilled centrally and following this, the  central portion of the rotational keel was hammered into place.  The 10  mm bridging bearing was then put in place, the trial femur, size 2 and  the knee put through a range of motion.  Computer assistance checked for  perfect neutral long alignment, perfect gap balance in extension and  flexion of 0.5 mm difference in gap balance.  At this point attention  was turned towards the patella which was cut down to a level of 13 mm  and then a 32 lollipop was chosen as this covers the entire  patella and  the lugs were drilled here.  Once this was completed, the attention was  turned towards removing all the trial components and the knee was then  copiously and thoroughly lavaged, irrigated and suctioned dry.  Once  this was completed, the knee was then further dried with lap sponges and  the final component size 2 femur, size 2 tibia with lugs and a 10 mm  bridging bearing are used to achieve excellent alignment hammered into  place.  All excess bone cement is removed.  The trial 10 poly was put in  place.  This patella cemented into place.  Once the cement was allowed  to harden, all remaining excess bone cement is removed, the tourniquet  let down.  Trial polys removed.  All bleeding controlled with  electrocautery.  Wound was copiously and thoroughly irrigated again and  the subcu was closed.  The medial parapatellar arthrotomy was closed  with a 1 Vicryl running suture with the medium Hemovac drain been placed  prior to closure.  Skin with 0 and 2-0 Vicryl, 3-0 Monocryl subcuticular  stitch.  Benzoin, Steri-Strips were applied.  Sterile compressive  dressing applied and the patient taken to recovery room where she was  noted to be in satisfactory condition.  The estimated blood loss for  this procedure was none.      Harvie Junior, M.D.  Electronically Signed     JLG/MEDQ  D:  05/16/2007  T:  05/17/2007  Job:  [id number]

## 2010-10-12 NOTE — Op Note (Signed)
NAME:  Caitlin [name], Caitlin [name]                    ACCOUNT NO.:  [id number]   MEDICAL RECORD NO.:  [id number]          PATIENT TYPE:  AMB   LOCATION:  NESC                         FACILITY:  [facility]   PHYSICIAN:  Excell Seltzer. Annabell Howells, M.D.    DATE OF BIRTH:  [birth year]   DATE OF PROCEDURE:  02/21/2008  DATE OF DISCHARGE:                               OPERATIVE REPORT   PROCEDURE:  Transurethral resection of bladder tumor bed.   PREOPERATIVE DIAGNOSIS:  History of high-grade TX sarcomatoid variant  urothelial carcinoma.   POSTOPERATIVE DIAGNOSIS:  History of high-grade TX sarcomatoid variant  urothelial carcinoma.   SURGEON:  Dr. Bjorn Pippin.   ANESTHESIA:  General.   SPECIMEN:  Chips from tumor bed.   DRAIN:  A 20-French Foley catheter.   COMPLICATIONS:  None.   INDICATIONS:  [name] is a 70 year old white female who underwent  resection of a large high-grade sarcomatoid variant urothelial carcinoma  from the left lateral wall and floor of the bladder.  Muscle was not  apparent in the specimen and it was felt that additional resection was  indicated to determine whether there is residual disease or muscle  invasive cancer.   FINDINGS AND PROCEDURE:  The patient was given Cipro.  She was taken to  the operating room, fitted with PAS hose and general anesthetic with  muscle relaxer was induced.  She was placed in the lithotomy position  and her perineum and genitalia were prepped with Betadine solution.  She  was draped in the usual sterile fashion.   Exam under anesthesia was performed.  No obvious palpable pelvic masses  were noted.   The 28-French continuous flow resectoscope sheath was then inserted.  This was fitted with a 12-degree lens, Iglesias handle and 24 loop.  The  prior resection site was identified and the base was generously resected  down into the muscular fibers with exposed fat in a couple of areas to  ensure proper depth.  No evidence of bladder wall perforation was noted,  however.  Once appropriate resection was performed, which encompassed an  area of approximately 3 cm, the specimen was removed and final  hemostasis was achieved.  At this point the resectoscope was removed.  A  20-French Foley catheter was inserted.  The balloon was filled with 10  mL of sterile fluid, and irrigated with an Asepto syringe, with clear  return.  Of note, during cystoscopic inspection, no obvious residual  papillary tumors were noted.  The ureteral orifices were intact.  At  this point the patient was taken down from lithotomy position.  Her  anesthetic was reversed.  She was moved to the recovery room in stable  condition.  There were no complications.      Excell Seltzer. Annabell Howells, M.D.  Electronically Signed     JJW/MEDQ  D:  02/21/2008  T:  02/22/2008  Job:  [id number]   cc:   Larina Earthly, M.D.  Fax: [phone]   Pierce Crane, MD  Fax: [phone]

## 2010-10-15 NOTE — Op Note (Signed)
NAME:  Caitlin [name], Caitlin [name]                    ACCOUNT NO.:  [id number]   MEDICAL RECORD NO.:  [id number]          PATIENT TYPE:  AMB   LOCATION:  DSC                          FACILITY:  [facility]   PHYSICIAN:  Harvie Junior, M.D.   DATE OF BIRTH:  1940/06/20   DATE OF PROCEDURE:  12/09/2005  DATE OF DISCHARGE:                                 OPERATIVE REPORT   PREPROCEDURE DIAGNOSES:  1. De Quervain tendinitis.  2. Ganglion cyst.   POSTOPERATIVE DIAGNOSES:  1. De Quervain tendinitis.  2. Ganglion cyst.  3. Significant spurs of the distal radius fracture.   PROCEDURE:  1. Release of de Quervain tenosynovitis.  2. Debridement of ganglion cyst.  3. Debridement of distal radius spurs in the floor of the first extensor      compartment.   SURGEON:  Harvie Junior, M.D.   ASSISTANT:  _______________   ANESTHESIA:  General.   BRIEF HISTORY:  Miss Hornbaker is a 70 year old female with a long history of  having had significant pain in the area of the wrist.  She was diagnosed  with de Quervain tenosynovitis, injection therapy had helped her.  After  failure of conservative care, she was ultimately taken to the operating room  for release of the first extensor compartment and other as necessary.  She  was also noted to have a little ganglion cyst in this area.  This was felt  it could be debrided simultaneously at the time of resection.   DESCRIPTION OF PROCEDURE:  The patient was taken to the operating room and  after adequate anesthesia was obtained with general anesthetic, the patient  was placed on the operating table, where the right wrist was then prepped  and draped in the usual sterile fashion.  Following this, a curved incision  was made over the distal radius, and subcutaneous tissue down to the level  of the first extensor compartment, which was identified.  The ganglion cyst  was identified and the root of the first extensor compartment was excised  and this gained access into the first  extensor compartment.  The first  extensor compartment was then released.  All of the tendons were identified.  Second extensor compartment was identified and minimally released to make  sure that we had gotten all of the slips of the first extensor compartment.  Following this, the first extensor compartment was evaluated and there was  noted to be fairly significant spurring and arthritic change at the floor of  the first extensor compartment.  This was debrided with a rongeur and  smoothed back.  This actually turned out to be quite a significant portion  of the case, as I think recurrent irritation of the first extensor  compartment was more from the floor of the extensor compartment.  The floor of the extensor compartment with the irritation attendant at this  point, the wound was copiously irrigated and suctioned dry.  Closed with  Monocryl closure.  Sterile compression dressing was applied, as well as  splint.  The patient was taken to the recovery room  as noted, to be in  satisfactory condition.      Harvie Junior, M.D.  Electronically Signed     JLG/MEDQ  D:  12/09/2005  T:  12/10/2005  Job:  [id number]

## 2010-10-15 NOTE — Discharge Summary (Signed)
NAME:  Caitlin [name], Caitlin [name]                    ACCOUNT NO.:  [id number]   MEDICAL RECORD NO.:  [id number]          PATIENT TYPE:  INP   LOCATION:  1423                         FACILITY:  [facility]   PHYSICIAN:  Excell Seltzer. Annabell Howells, M.D.    DATE OF BIRTH:  [birth year]   DATE OF ADMISSION:  04/21/2008  DATE OF DISCHARGE:  04/28/2008                               DISCHARGE SUMMARY   Briefly, 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  A re-resection  revealed no residual disease and a metastatic workup was negative but  because of her very aggressive pathology cystectomy was considered.  That option was also reinforced by a second opinion from [facility]  She has elected to undergo radical cystectomy with urinary diversion.  The patient prefers a neobladder but understood at admission that an  ileal conduit was a possibility.   PAST HISTORY:  Pertinent for:  1. Prior breast cancer.  2. History of hypercholesterolemia.  3. History of hypertension.  4. History of urge incontinence.   SURGICAL HISTORY:  Significant for:  1. Breast lumpectomy.  2. TURBT x2.  3. Hysterectomy.  4. Knee replacement.   MEDICATIONS ON ADMISSION:  Include:  1. Aromasin.  2. Aspirin.  3. Fish oil.  4. Fosamax.  5. Lisinopril.   SHE HAD NO DRUG ALLERGIES.   FAMILY HISTORY:  Remarkable for stroke in her mother.   SOCIAL HISTORY:  She denies tobacco or alcohol.  She is married.   REVIEW OF SYSTEMS:  She had some urgency but no dysuria or hematuria.  She had no diarrhea or constipation.  She had no fatigue or malaise.  No  chest pain or leg swelling and no shortness of breath and was otherwise  without complaints per a full review of systems.   On  April 18, 2002, the patient was taken to the operating room.  She  underwent an uneventful radical cystectomy with sparing of the  urethra  and vagina and creation of an ileal neobladder by myself and Dr.  Retta Diones.  Frozen sections of the distal ureters were negative and the  bladder was sent for permanent section with a final pathology of no  residual cancer.  Lymph nodes were also obtained which were negative.  Her postoperative drains included a Foley catheter and bilateral 7-  French ureteral stents and a wound drain.  Postoperatively, she was a  little sore but was doing well with moderate JP output and a hemoglobin  of  9.6 and creatinine of 0.8.  On postoperative day 1 which was  postoperative day 2 for Unasyn, she was afebrile with stable vital  signs.  Her NG tube was draining well.  Her Jackson-Pratt drainage was  90 mL in 8 hours and her urine output was 145 mL for 8 hours.  Hemoglobin was stable at 9.8 with a white count of 12.8, creatinine was  1.68, glucose was 220.  She had  been on Toradol but because of the bump  in the creatinine that was stopped and she was given a 500 mL normal  saline bolus.  She was felt to be ready for transfer from the ICU to the  floor and with the elevated glucose CBGs were ordered.  On the evening  of April 22, 2008, she was ambulating.  She was afebrile.  Her urine  was clearing.  On April 23, 2008, she continued to do well.  Creatinine increased again to 2.2 and her potassium was up to 5.  Her  IVs were changed to D5 half normal and her fluids were increased.  Her  Unasyn was discontinued.  On the evening of April 23, 2008, her urine  output had increased.  The JP drainage had increased but it declined.  A  creatinine sent from the JP drain was only 2.8 with a serum creatinine  of 2.2.  Her NG was removed but she was to be maintained n.p.o. until  flatus was noted.  On the third postoperative day, April 24, 2008,  she was doing well, was afebrile, and was passing gas.  She was started  on clear liquids.  On April 25, 2008, she continued do well.  On   April 26, 2008, she had had several bowel movements.  Her JP had 40  mL output.  Foley catheter was producing excellent urine output.  Her  diet was advanced.  Her PCA was discontinued.  On April 27, 2008, she  continued to do well.  She was tolerating full liquids and was advanced  to regular.  JP drainage was minimal.  Urine output was excellent.  On  April 28, 2008, she was felt to be ready for discharge home.   FINAL DIAGNOSIS:  Is sarcomatoid bladder cancer, stage Ta N0 M0 with a  P0 pathology specimen from the bladder.  There were no complications  during her admission.   DISCHARGE MEDICATIONS:  Included:  1. Vicodin.  2. Septra.  3. As well as resumption of her home medications which included      Aromasin, aspirin, fish oil, Fosamax, and lisinopril.   She will follow up in 1 week with Dr. Retta Diones.   PROGNOSIS:  Good.   CONDITION:  Improved.   DISPOSITION:  To home.   Please note that her Jackson-Pratt drain was removed prior to discharge.      Excell Seltzer. Annabell Howells, M.D.  Electronically Signed     JJW/MEDQ  D:  05/15/2008  T:  05/16/2008  Job:  [id number]   cc:   Pierce Crane, MD  Fax: [phone]   Larina Earthly, M.D.  Fax: [phone]

## 2010-10-15 NOTE — Op Note (Signed)
NAME:  Caitlin [name], Caitlin [name]                         ACCOUNT NO.:  [id number]   MEDICAL RECORD NO.:  [id number]                   PATIENT TYPE:  AMB   LOCATION:  DSC                                  FACILITY:  [facility]   PHYSICIAN:  Rose Phi. Maple Hudson, M.D.                DATE OF BIRTH:  [birth year]   DATE OF PROCEDURE:  09/22/2003  DATE OF DISCHARGE:                                 OPERATIVE REPORT   PREOPERATIVE DIAGNOSIS:  Stage I carcinoma of the left breast.   POSTOPERATIVE DIAGNOSIS:  Stage I carcinoma of the left breast.   OPERATION:  1. Blue dye injection.  2. Left partial mastectomy with needle localization and specimen     mammography.  3. Left sentinel lymph node biopsy.   SURGEON:  Rose Phi. Maple Hudson, M.D.   ANESTHESIA:  General.   DESCRIPTION OF PROCEDURE:  Prior to coming to the operating room, a  localizing wire had been placed in the lower inner quadrant of her left  breast and 1 mc of technetium sulfur colloid was injected intradermally.  After suitable general anesthesia was induced, the patient was placed in the  supine position with the arms extended on the arm board.  Then 5 mL of a  mixture of 2 mL of methylene blue and 3 mL of injectable saline were  injected into the subareolar tissue, and the breast gently massaged for  about three minutes.  We then made a curved incision in the lower inner quadrant using the  previously-placed wire as a guide, and the wire and surrounding tissue were  excised.  The specimen was oriented for the pathologist.  The specimen  mammography showed what appeared to be the calcifications near the margin at  the superior or cranial area.  I then took some more tissue from that area  to make sure the margin was clean.  While that was all being done, a short  transverse left axillary incision was made, with dissection through the  subcutaneous tissue to the clavipectoral fascia.  Deep to the fascia was one  enlarged hot but not blue lymph node.  I  could not find any hot, blue, or  palpable nodes.  This was submitted as a sentinel node and a touch prep  preliminary report is negative.  Marcaine 0.25% was injected in both  incisions, and then both incisions were closed with #3-0 Vicryl and  subcuticular #4-0 Monocryl and Steri-Strips.  Dressings were applied.  The patient was transferred to the recovery room in satisfactory condition,  having tolerated the procedure well.                                               Rose Phi. Maple Hudson, M.D.    PRY/MEDQ  D:  09/22/2003  T:  09/22/2003  Job:  [id number]

## 2011-02-28 LAB — POCT I-STAT 4, (NA,K, GLUC, HGB,HCT)
Glucose, Bld: 92
HCT: 45
Hemoglobin: 15.3 — ABNORMAL HIGH

## 2011-03-01 LAB — BASIC METABOLIC PANEL
CO2: 21
CO2: 21
CO2: 22
CO2: 24
CO2: 24
Calcium: 7.2 — ABNORMAL LOW
Calcium: 7.7 — ABNORMAL LOW
Chloride: 104
Chloride: 105
Chloride: 107
Chloride: 111
Chloride: 113 — ABNORMAL HIGH
Creatinine, Ser: 0.88
Creatinine, Ser: 1.96 — ABNORMAL HIGH
Creatinine, Ser: 2.22 — ABNORMAL HIGH
GFR calc Af Amer: 27 — ABNORMAL LOW
GFR calc Af Amer: 31 — ABNORMAL LOW
GFR calc Af Amer: 37 — ABNORMAL LOW
GFR calc Af Amer: >60
GFR calc Af Amer: >60
GFR calc Af Amer: >60
GFR calc non Af Amer: 55 — ABNORMAL LOW
Glucose, Bld: 131 — ABNORMAL HIGH
Glucose, Bld: 150 — ABNORMAL HIGH
Glucose, Bld: 220 — ABNORMAL HIGH
Potassium: 3.3 — ABNORMAL LOW
Potassium: 3.9
Potassium: 4.3
Sodium: 135
Sodium: 136
Sodium: 137
Sodium: 138

## 2011-03-01 LAB — POCT I-STAT 7, (LYTES, BLD GAS, ICA,H+H)
Acid-base deficit: 2
Bicarbonate: 22.6
Hemoglobin: 11.2 — ABNORMAL LOW
Sodium: 142
TCO2: 24
pH, Arterial: 7.416 — ABNORMAL HIGH
pO2, Arterial: 108 — ABNORMAL HIGH

## 2011-03-01 LAB — TYPE AND SCREEN: Donor AG Type: NEGATIVE

## 2011-03-01 LAB — GLUCOSE, CAPILLARY
Glucose-Capillary: 119 — ABNORMAL HIGH
Glucose-Capillary: 125 — ABNORMAL HIGH
Glucose-Capillary: 126 — ABNORMAL HIGH
Glucose-Capillary: 126 — ABNORMAL HIGH
Glucose-Capillary: 129 — ABNORMAL HIGH
Glucose-Capillary: 89

## 2011-03-01 LAB — CBC
HCT: 28.6 — ABNORMAL LOW
HCT: 29.6 — ABNORMAL LOW
HCT: 41.6
Hemoglobin: 9 — ABNORMAL LOW
Hemoglobin: 9.6 — ABNORMAL LOW
Hemoglobin: 9.6 — ABNORMAL LOW
Hemoglobin: 9.8 — ABNORMAL LOW
MCHC: 33
MCHC: 33.2
MCHC: 33.3
MCHC: 33.7
MCHC: 34
MCV: 93
MCV: 93
MCV: 94
MCV: 94.8
MCV: 95.2
RBC: 2.81 — ABNORMAL LOW
RBC: 2.83 — ABNORMAL LOW
RBC: 3.07 — ABNORMAL LOW
RBC: 3.08 — ABNORMAL LOW
RBC: 3.12 — ABNORMAL LOW
RBC: 4.47
RDW: 13.5
RDW: 13.6
RDW: 13.9
WBC: 11.5 — ABNORMAL HIGH
WBC: 7.4

## 2011-03-01 LAB — CREATININE, FLUID (PLEURAL, PERITONEAL, JP DRAINAGE): Creat, Fluid: 2.8

## 2011-03-02 LAB — POCT I-STAT 4, (NA,K, GLUC, HGB,HCT)
Glucose, Bld: 97
Sodium: 139

## 2011-03-04 LAB — CBC
HCT: 30.7 — ABNORMAL LOW
HCT: 31.1 — ABNORMAL LOW
HCT: 32 — ABNORMAL LOW
Hemoglobin: 10.4 — ABNORMAL LOW
Hemoglobin: 11.1 — ABNORMAL LOW
MCHC: 33.8
MCV: 90.4
MCV: 92.5
RBC: 3.32 — ABNORMAL LOW
RBC: 3.45 — ABNORMAL LOW
RDW: 13.5
WBC: 10.2
WBC: 8.8

## 2011-03-04 LAB — BASIC METABOLIC PANEL
GFR calc non Af Amer: >60
Glucose, Bld: 185 — ABNORMAL HIGH
Potassium: 4.2
Sodium: 139

## 2011-03-04 LAB — TYPE AND SCREEN: Antibody Screen: POSITIVE

## 2011-03-07 LAB — TYPE AND SCREEN
ABO/RH(D): A NEG
Antibody Screen: POSITIVE
DAT, IgG: NEGATIVE
Donor AG Type: NEGATIVE

## 2011-03-07 LAB — COMPREHENSIVE METABOLIC PANEL
AST: 25
BUN: 12
CO2: 26
Calcium: 9.9
Creatinine, Ser: 0.77
GFR calc Af Amer: >60
GFR calc non Af Amer: >60

## 2011-03-07 LAB — DIFFERENTIAL
Eosinophils Relative: 4
Lymphocytes Relative: 31
Lymphs Abs: 2.4
Neutro Abs: 4.6
Neutrophils Relative %: 59

## 2011-03-07 LAB — CBC
MCHC: 34.4
MCV: 90.8
Platelets: 436 — ABNORMAL HIGH
RBC: 4.53

## 2011-03-07 LAB — URINALYSIS, ROUTINE W REFLEX MICROSCOPIC
Ketones, ur: NEGATIVE
Nitrite: NEGATIVE
Protein, ur: NEGATIVE
Urobilinogen, UA: 0.2

## 2011-03-07 LAB — URINE MICROSCOPIC-ADD ON

## 2011-03-07 LAB — PROTIME-INR
INR: 1
Prothrombin Time: 13

## 2011-03-07 LAB — APTT: aPTT: 36

## 2011-03-18 ENCOUNTER — Ambulatory Visit (HOSPITAL_COMMUNITY)
Admission: RE | Admit: 2011-03-18 | Discharge: 2011-03-18 | Disposition: A | Discharge status: Home / Self Care | Payer: Medicare Other | Source: Ambulatory Visit | Attending: Urology | Admitting: Urology

## 2011-03-18 ENCOUNTER — Other Ambulatory Visit: Payer: Self-pay | Admitting: Urology

## 2011-03-18 DIAGNOSIS — C679 Malignant neoplasm of bladder, unspecified: Secondary | ICD-10-CM | POA: Insufficient documentation

## 2011-06-18 ENCOUNTER — Telehealth: Payer: Self-pay | Admitting: Oncology

## 2011-06-18 NOTE — Telephone Encounter (Signed)
called pt lmovm that her appts on 2-22 were moved to 2-21 and to rtn call to confirm appts

## 2011-07-14 ENCOUNTER — Telehealth: Payer: Self-pay | Admitting: *Deleted

## 2011-07-14 NOTE — Telephone Encounter (Signed)
spoke to patient on 07-14-2011 patient stated she will just keep her appointment for 07-21-2011

## 2011-07-21 ENCOUNTER — Other Ambulatory Visit (HOSPITAL_BASED_OUTPATIENT_CLINIC_OR_DEPARTMENT_OTHER): Payer: Medicare Other | Admitting: Lab

## 2011-07-21 ENCOUNTER — Ambulatory Visit (HOSPITAL_BASED_OUTPATIENT_CLINIC_OR_DEPARTMENT_OTHER): Payer: Medicare Other | Admitting: Oncology

## 2011-07-21 ENCOUNTER — Telehealth: Payer: Self-pay | Admitting: *Deleted

## 2011-07-21 VITALS — BP 134/73 | HR 77 | Temp 98.0°F | Ht 61.5 in | Wt 167.0 lb

## 2011-07-21 DIAGNOSIS — Z853 Personal history of malignant neoplasm of breast: Secondary | ICD-10-CM

## 2011-07-21 DIAGNOSIS — C50919 Malignant neoplasm of unspecified site of unspecified female breast: Secondary | ICD-10-CM

## 2011-07-21 LAB — CBC WITH DIFFERENTIAL/PLATELET
BASO%: 0.3 % (ref 0.0–2.0)
HCT: 37.3 % (ref 34.8–46.6)
LYMPH%: 24.9 % (ref 14.0–49.7)
MCHC: 33.5 g/dL (ref 31.5–36.0)
MCV: 93.2 fL (ref 79.5–101.0)
MONO%: 5 % (ref 0.0–14.0)
NEUT%: 66.9 % (ref 38.4–76.8)
Platelets: 390 10*3/uL (ref 145–400)
RBC: 4 10*6/uL (ref 3.70–5.45)

## 2011-07-21 NOTE — Telephone Encounter (Signed)
gave patient appointment for mammogram and bone density in april gave patient appointment for lab and doctor visit in 2014

## 2011-07-21 NOTE — Progress Notes (Signed)
Hematology and Oncology Follow Up Visit  Caitlin [name] [id number] 1941-04-08 71 y.o. 07/21/2011 11:16 AM PCP  Principle Diagnosis: T1 B. N0 breast cancer left side, status post lumpectomy radiation therapy completed 01/09/2004 on MA 27 study, to receive 5 years of Aromasin and discontinue 2000 and 2 history of cystectomy who had locally advanced bladder cancer status post neobladder formation  Interim History:  There have been no intercurrent illness, hospitalizations or medication changes. She has been feeling well. She has no complaints related to her bladder. She continues to get followup of urology. From a breast cancer point of view she is doing well. She due for followup mammogram in April and we'll need a bone density test as well. Medications: I have reviewed the patient's current medications.  Allergies: Not on File  Past Medical History, Surgical history, Social history, and Family History were reviewed and updated.  Review of Systems: Constitutional:  Negative for fever, chills, night sweats, anorexia, weight loss, pain. Cardiovascular: no chest pain or dyspnea on exertion Respiratory: negative Neurological: negative Dermatological: negative ENT: negative Skin Gastrointestinal: negative Genito-Urinary: negative Hematological and Lymphatic: negative Breast: negative Musculoskeletal: negative Remaining ROS negative.  Physical Exam: Blood pressure 134/73, pulse 77, temperature 98 F (36.7 C), height 5' 1.5" (1.562 m), weight 167 lb (75.751 kg). ECOG:  General appearance: alert, cooperative and appears stated age Head: Normocephalic, without obvious abnormality, atraumatic Neck: no adenopathy, no carotid bruit, no JVD, supple, symmetrical, trachea midline and thyroid not enlarged, symmetric, no tenderness/mass/nodules Lymph nodes: Cervical, supraclavicular, and axillary nodes normal. Cardiac : regular rate and rhythm, no murmurs or gallops Pulmonary:clear to auscultation  bilaterally and normal percussion bilaterally Breasts: inspection negative, no nipple discharge or bleeding, no masses or nodularity palpable Abdomen:soft, non-tender; bowel sounds normal; no masses,  no organomegaly Extremities negative Neuro: alert, oriented, normal speech, no focal findings or movement disorder noted  Lab Results: Lab Results  Component Value Date   WBC 6.4 07/21/2011   HGB 12.5 07/21/2011   HCT 37.3 07/21/2011   MCV 93.2 07/21/2011   PLT 390 07/21/2011     Chemistry      Component Value Date/Time   NA 139 07/05/2010 1358   K 4.2 07/05/2010 1358   CL 106 07/05/2010 1358   CO2 20 07/05/2010 1358   BUN 21 07/05/2010 1358   CREATININE 1.02 07/05/2010 1358      Component Value Date/Time   CALCIUM 9.5 07/05/2010 1358   ALKPHOS 54 07/05/2010 1358   AST 19 07/05/2010 1358   ALT 15 07/05/2010 1358   BILITOT 0.3 07/05/2010 1358      .pathology. Radiological Studies: chest X-ray n/a Mammogram Due 4/13 Bone density Due 4/13  Impression and Plan: Ms. JURGENSEN is doing well. She is about 8 years out from the time of her initial diagnosis. I will continue to see her in an annual basis until year 10. We will get imaging studies in the interim.  More than 50% of the visit was spent in patient-related counselling   Pierce Crane, MD 2/21/201311:16 AM

## 2011-07-22 LAB — COMPREHENSIVE METABOLIC PANEL
AST: 20 U/L (ref 0–37)
Albumin: 4 g/dL (ref 3.5–5.2)
BUN: 24 mg/dL — ABNORMAL HIGH (ref 6–23)
CO2: 24 mEq/L (ref 19–32)
Calcium: 10 mg/dL (ref 8.4–10.5)
Chloride: 105 mEq/L (ref 96–112)
Glucose, Bld: 83 mg/dL (ref 70–99)
Potassium: 4.6 mEq/L (ref 3.5–5.3)

## 2011-09-20 ENCOUNTER — Ambulatory Visit
Admission: RE | Admit: 2011-09-20 | Discharge: 2011-09-20 | Disposition: A | Discharge status: Home / Self Care | Payer: Medicare Other | Source: Ambulatory Visit | Attending: Oncology | Admitting: Oncology

## 2011-09-20 DIAGNOSIS — M8580 Other specified disorders of bone density and structure, unspecified site: Secondary | ICD-10-CM

## 2011-09-20 DIAGNOSIS — C50919 Malignant neoplasm of unspecified site of unspecified female breast: Secondary | ICD-10-CM

## 2011-09-22 ENCOUNTER — Other Ambulatory Visit: Payer: Self-pay | Admitting: Urology

## 2011-09-22 ENCOUNTER — Ambulatory Visit (HOSPITAL_COMMUNITY)
Admission: RE | Admit: 2011-09-22 | Discharge: 2011-09-22 | Disposition: A | Discharge status: Home / Self Care | Payer: Medicare Other | Source: Ambulatory Visit | Attending: Urology | Admitting: Urology

## 2011-09-22 DIAGNOSIS — C679 Malignant neoplasm of bladder, unspecified: Secondary | ICD-10-CM | POA: Insufficient documentation

## 2012-03-20 ENCOUNTER — Ambulatory Visit (HOSPITAL_COMMUNITY)
Admission: RE | Admit: 2012-03-20 | Discharge: 2012-03-20 | Disposition: A | Discharge status: Home / Self Care | Payer: Medicare Other | Source: Ambulatory Visit | Attending: Urology | Admitting: Urology

## 2012-03-20 ENCOUNTER — Other Ambulatory Visit: Payer: Self-pay | Admitting: Urology

## 2012-03-20 DIAGNOSIS — C679 Malignant neoplasm of bladder, unspecified: Secondary | ICD-10-CM

## 2012-06-25 ENCOUNTER — Other Ambulatory Visit: Payer: Self-pay | Admitting: Gastroenterology

## 2012-06-26 ENCOUNTER — Telehealth: Payer: Self-pay | Admitting: *Deleted

## 2012-06-26 NOTE — Telephone Encounter (Signed)
Left message for pt to return my call so I can schedule her w/ new med onc.

## 2012-06-28 ENCOUNTER — Encounter: Payer: Self-pay | Admitting: *Deleted

## 2012-06-28 NOTE — Progress Notes (Signed)
Called and spoke with patient about rescheduling her appt.  Confirmed appt. For 2/25 at 0915 with Ramond Craver, NP.  Then will become Dr.Magrinat patient.

## 2012-06-29 ENCOUNTER — Encounter: Payer: Self-pay | Admitting: Oncology

## 2012-07-10 ENCOUNTER — Other Ambulatory Visit: Payer: Medicare Other | Admitting: Lab

## 2012-07-17 ENCOUNTER — Ambulatory Visit: Payer: Medicare Other | Admitting: Oncology

## 2012-07-24 ENCOUNTER — Encounter: Payer: Self-pay | Admitting: Nurse Practitioner

## 2012-07-24 ENCOUNTER — Ambulatory Visit (HOSPITAL_BASED_OUTPATIENT_CLINIC_OR_DEPARTMENT_OTHER): Payer: Medicare Other | Admitting: Nurse Practitioner

## 2012-07-24 VITALS — BP 144/81 | HR 80 | Temp 97.3°F | Resp 20 | Ht 61.5 in | Wt 166.2 lb

## 2012-07-24 DIAGNOSIS — M810 Age-related osteoporosis without current pathological fracture: Secondary | ICD-10-CM

## 2012-07-24 DIAGNOSIS — Z853 Personal history of malignant neoplasm of breast: Secondary | ICD-10-CM

## 2012-07-24 NOTE — Progress Notes (Signed)
[facility] OFFICE PROGRESS NOTE  Hoyle Sauer, MD  DIAGNOSIS: T1 B. N0 breast cancer left side, locally advanced bladder cancer  PAST THERAPY: Lumpectomy 2005 with radiation therapy completed 01/09/2004, was on MA27 study and took Aromasin for 5 years.     Cystectomy for locally advanced bladder cancer s/p neobladder formation  CURRENT THERAPY: observation, annual mammograms  INTERVAL HISTORY: Caitlin [name] 72 y.o. female returns for routine follow up of her   MEDICAL HISTORY: breast cancer, bladder cancer, seasonal allergies, osteoporosis  SURGICAL HISTORY: Lumpectomy in 2005, bladder surgery in 2009   MEDICATIONS: Current Outpatient Prescriptions  Medication Sig Dispense Refill  . alendronate (FOSAMAX) 35 MG tablet Take 35 mg by mouth every 7 (seven) days. Take with a full glass of water on an empty stomach.      [name] aspirin 81 MG tablet Take 81 mg by mouth daily.      . fexofenadine (ALLEGRA) 60 MG tablet Take 60 mg by mouth daily as needed.      . fish oil-omega-3 fatty acids 1000 MG capsule Take 2 g by mouth once a week.      . fluticasone (FLONASE) 50 MCG/ACT nasal spray       . lisinopril (PRINIVIL,ZESTRIL) 20 MG tablet Take 20 mg by mouth daily.      [name] omeprazole (PRILOSEC) 20 MG capsule       . VITAMIN B1-B12 IJ Inject as directed every 30 (thirty) days.       No current facility-administered medications for this visit.    ALLERGIES:  has No Known Allergies.  REVIEW OF SYSTEMS: General: denies unexplained weight loss, fatigue, night sweats, fevers, chills HEENT: denies headaches, blurred vision, dizziness, loss of balance Cardiac: denies chest pain, pressure or palpitations Lungs: denies wheezing, shortness of breath or productive cough Abd: denies nausea, vomiting, constipation, diarrhea, indigestion, blood in stool or urine Extremities: denies numbness, tingling, swelling or pain  The rest of the 14-point review of system was negative.   Filed  Vitals:   07/24/12 0915  BP: 144/81  Pulse: 80  Temp: 97.3 F (36.3 C)  Resp: 20   Wt Readings from Last 3 Encounters:  07/24/12 166 lb 3.2 oz (75.388 kg)  07/21/11 167 lb (75.751 kg)   ECOG Performance status: 0  PHYSICAL EXAMINATION: 72 year old caucasian female who appears her stated age  General:  well-nourished in no acute distress.   Eyes:  no scleral icterus.   ENT:  There were no oropharyngeal lesions.  Neck was without thyromegaly.  Lymphatics:  Negative cervical, supraclavicular, axillary, or inguinal adenopathy.  Respiratory: lungs were clear bilaterally without wheezing or crackles.  Cardiovascular:  Regular rate and rhythm, S1/S2, without murmur, rub or gallop.  There was no pedal edema.   Breast: no nipple retraction, no nipple discharge, no unusual masses or thickening, negat GI:  abdomen was soft, flat, nontender, nondistended, without organomegaly.  Musculoskeletal:  no spinal tenderness of palpation of vertebral spine.   Skin exam was without echymosis, petichae.   Neuro exam was nonfocal.  Cranial nerves II- XII grossly intact Patient was able to get on and off exam table without assistance.  Gait was normal.  Patient was alerted and oriented.  Attention was good.   Language was appropriate.  Mood was normal without depression.  Speech was not pressured.  Thought content was not tangential.    ASSESSMENT AND PLAN: She is doing well overall with no complaints. She is now 9 years  out from her breast cancer, and 4 years from her bladder cancer. Dr. Darnelle Catalan will see her back in one year at which point she will likely be discharged. No evidence of recurrence of either malignancy noted with negative radiology, labs and physical exam. She had bone density scan last in April 2013 and mammogram October 2013.      Caitlin [name], AOCNP, NP-C

## 2012-08-02 ENCOUNTER — Telehealth: Payer: Self-pay | Admitting: Oncology

## 2012-08-23 ENCOUNTER — Other Ambulatory Visit: Payer: Self-pay | Admitting: Internal Medicine

## 2012-08-23 ENCOUNTER — Other Ambulatory Visit: Payer: Self-pay

## 2012-08-23 DIAGNOSIS — Z9889 Other specified postprocedural states: Secondary | ICD-10-CM

## 2012-08-23 DIAGNOSIS — Z1231 Encounter for screening mammogram for malignant neoplasm of breast: Secondary | ICD-10-CM

## 2012-08-23 DIAGNOSIS — Z853 Personal history of malignant neoplasm of breast: Secondary | ICD-10-CM

## 2012-09-27 ENCOUNTER — Ambulatory Visit
Admission: RE | Admit: 2012-09-27 | Discharge: 2012-09-27 | Disposition: A | Discharge status: Home / Self Care | Payer: Medicare Other | Source: Ambulatory Visit

## 2012-09-27 DIAGNOSIS — Z853 Personal history of malignant neoplasm of breast: Secondary | ICD-10-CM

## 2012-09-27 DIAGNOSIS — Z9889 Other specified postprocedural states: Secondary | ICD-10-CM

## 2012-09-27 DIAGNOSIS — Z1231 Encounter for screening mammogram for malignant neoplasm of breast: Secondary | ICD-10-CM

## 2012-10-10 ENCOUNTER — Emergency Department (HOSPITAL_COMMUNITY): Payer: Medicare Other

## 2012-10-10 ENCOUNTER — Observation Stay (HOSPITAL_COMMUNITY)
Admission: EM | Admit: 2012-10-10 | Discharge: 2012-10-12 | DRG: 312 | Disposition: A | Discharge status: Home / Self Care | Payer: Medicare Other | Attending: Internal Medicine | Admitting: Internal Medicine

## 2012-10-10 ENCOUNTER — Encounter: Payer: Self-pay | Admitting: Cardiology

## 2012-10-10 ENCOUNTER — Encounter (HOSPITAL_COMMUNITY): Payer: Self-pay

## 2012-10-10 DIAGNOSIS — C50919 Malignant neoplasm of unspecified site of unspecified female breast: Secondary | ICD-10-CM

## 2012-10-10 DIAGNOSIS — Z79899 Other long term (current) drug therapy: Secondary | ICD-10-CM | POA: Insufficient documentation

## 2012-10-10 DIAGNOSIS — R42 Dizziness and giddiness: Secondary | ICD-10-CM

## 2012-10-10 DIAGNOSIS — R55 Syncope and collapse: Secondary | ICD-10-CM

## 2012-10-10 DIAGNOSIS — I10 Essential (primary) hypertension: Secondary | ICD-10-CM | POA: Insufficient documentation

## 2012-10-10 DIAGNOSIS — N390 Urinary tract infection, site not specified: Secondary | ICD-10-CM

## 2012-10-10 DIAGNOSIS — I951 Orthostatic hypotension: Principal | ICD-10-CM

## 2012-10-10 DIAGNOSIS — Z8551 Personal history of malignant neoplasm of bladder: Secondary | ICD-10-CM | POA: Insufficient documentation

## 2012-10-10 DIAGNOSIS — N289 Disorder of kidney and ureter, unspecified: Secondary | ICD-10-CM | POA: Insufficient documentation

## 2012-10-10 DIAGNOSIS — E860 Dehydration: Secondary | ICD-10-CM | POA: Insufficient documentation

## 2012-10-10 HISTORY — DX: Essential (primary) hypertension: I10

## 2012-10-10 HISTORY — DX: Nonrheumatic mitral (valve) prolapse: I34.1

## 2012-10-10 HISTORY — DX: Malignant neoplasm of bladder, unspecified: C67.9

## 2012-10-10 HISTORY — DX: Acquired absence of other parts of urinary tract: Z90.6

## 2012-10-10 HISTORY — DX: Malignant neoplasm of unspecified site of unspecified female breast: C50.919

## 2012-10-10 LAB — CBC
HCT: 36 % (ref 36.0–46.0)
Hemoglobin: 12.9 g/dL (ref 12.0–15.0)
MCH: 31.1 pg (ref 26.0–34.0)
MCH: 30.3 pg (ref 26.0–34.0)
MCH: 29.9 pg (ref 26.0–34.0)
MCHC: 34.1 g/dL (ref 30.0–36.0)
MCHC: 32.8 g/dL (ref 30.0–36.0)
Platelets: 419 10*3/uL — ABNORMAL HIGH (ref 150–400)
Platelets: 461 10*3/uL — ABNORMAL HIGH (ref 150–400)
RBC: 4.09 MIL/uL (ref 3.87–5.11)
RBC: 4.26 MIL/uL (ref 3.87–5.11)
RDW: 13.7 % (ref 11.5–15.5)
WBC: 12.1 10*3/uL — ABNORMAL HIGH (ref 4.0–10.5)

## 2012-10-10 LAB — DIFFERENTIAL
Basophils Absolute: 0 10*3/uL (ref 0.0–0.1)
Basophils Relative: 0 % (ref 0–1)
Eosinophils Relative: 1 % (ref 0–5)
Monocytes Absolute: 0.6 10*3/uL (ref 0.1–1.0)
Neutro Abs: 9.1 10*3/uL — ABNORMAL HIGH (ref 1.7–7.7)

## 2012-10-10 LAB — POCT I-STAT, CHEM 8
BUN: 29 mg/dL — ABNORMAL HIGH (ref 6–23)
Chloride: 110 mEq/L (ref 96–112)
HCT: 38 % (ref 36.0–46.0)
Sodium: 137 mEq/L (ref 135–145)
TCO2: 22 mmol/L (ref 0–100)

## 2012-10-10 LAB — COMPREHENSIVE METABOLIC PANEL
AST: 22 U/L (ref 0–37)
Albumin: 3.3 g/dL — ABNORMAL LOW (ref 3.5–5.2)
Calcium: 9.8 mg/dL (ref 8.4–10.5)
Creatinine, Ser: 1.2 mg/dL — ABNORMAL HIGH (ref 0.50–1.10)
Total Protein: 7.9 g/dL (ref 6.0–8.3)

## 2012-10-10 LAB — URINALYSIS, ROUTINE W REFLEX MICROSCOPIC
Glucose, UA: NEGATIVE mg/dL
pH: 6 (ref 5.0–8.0)

## 2012-10-10 LAB — PROTIME-INR
INR: 1.01 (ref 0.00–1.49)
Prothrombin Time: 13.2 seconds (ref 11.6–15.2)

## 2012-10-10 LAB — CREATININE, SERUM: GFR calc non Af Amer: 46 mL/min — ABNORMAL LOW (ref >90)

## 2012-10-10 LAB — URINE MICROSCOPIC-ADD ON

## 2012-10-10 MED ORDER — ACETAMINOPHEN 650 MG RE SUPP: 650.0000 mg | Freq: Four times a day (QID) | RECTAL | Status: DC | PRN

## 2012-10-10 MED ORDER — OXYCODONE HCL 5 MG PO TABS: 5.0000 mg | ORAL_TABLET | ORAL | Status: DC | PRN

## 2012-10-10 MED ORDER — PANTOPRAZOLE SODIUM 40 MG PO TBEC
40.0000 mg | DELAYED_RELEASE_TABLET | Freq: Every day | ORAL | Status: DC
  Administered 2012-10-10 – 2012-10-12 (×3): 40 mg via ORAL
  Filled 2012-10-10 (×3): qty 1

## 2012-10-10 MED ORDER — DEXTROSE 5 % IV SOLN
1.0000 g | INTRAVENOUS | Status: DC
  Administered 2012-10-11: 1 g via INTRAVENOUS
  Filled 2012-10-10 (×3): qty 10

## 2012-10-10 MED ORDER — ACETAMINOPHEN 325 MG PO TABS
650.0000 mg | ORAL_TABLET | Freq: Four times a day (QID) | ORAL | Status: DC | PRN
  Administered 2012-10-11: 650 mg via ORAL
  Filled 2012-10-10: qty 2

## 2012-10-10 MED ORDER — ONDANSETRON HCL 4 MG PO TABS: 4.0000 mg | ORAL_TABLET | Freq: Four times a day (QID) | ORAL | Status: DC | PRN

## 2012-10-10 MED ORDER — SENNOSIDES-DOCUSATE SODIUM 8.6-50 MG PO TABS
1.0000 | ORAL_TABLET | Freq: Every evening | ORAL | Status: DC | PRN
  Filled 2012-10-10: qty 1

## 2012-10-10 MED ORDER — SODIUM CHLORIDE 0.9 % IV SOLN
INTRAVENOUS | Status: DC
  Administered 2012-10-10 – 2012-10-12 (×4): via INTRAVENOUS

## 2012-10-10 MED ORDER — SODIUM CHLORIDE 0.9 % IJ SOLN: 3.0000 mL | Freq: Two times a day (BID) | INTRAMUSCULAR | Status: DC

## 2012-10-10 MED ORDER — SODIUM CHLORIDE 0.9 % IV BOLUS (SEPSIS)
500.0000 mL | Freq: Once | INTRAVENOUS | Status: AC
  Administered 2012-10-10: 500 mL via INTRAVENOUS

## 2012-10-10 MED ORDER — ENOXAPARIN SODIUM 40 MG/0.4ML ~~LOC~~ SOLN
40.0000 mg | SUBCUTANEOUS | Status: DC
  Administered 2012-10-10 – 2012-10-11 (×2): 40 mg via SUBCUTANEOUS
  Filled 2012-10-10 (×3): qty 0.4

## 2012-10-10 MED ORDER — DEXTROSE 5 % IV SOLN
1.0000 g | INTRAVENOUS | Status: DC
  Administered 2012-10-10: 1 g via INTRAVENOUS
  Filled 2012-10-10: qty 10

## 2012-10-10 MED ORDER — ASPIRIN EC 81 MG PO TBEC
81.0000 mg | DELAYED_RELEASE_TABLET | Freq: Every day | ORAL | Status: DC
  Administered 2012-10-11 – 2012-10-12 (×2): 81 mg via ORAL
  Filled 2012-10-10 (×3): qty 1

## 2012-10-10 MED ORDER — DEXTROSE-NACL 5-0.45 % IV SOLN: INTRAVENOUS | Status: DC

## 2012-10-10 MED ORDER — FLUTICASONE PROPIONATE 50 MCG/ACT NA SUSP
1.0000 | Freq: Every day | NASAL | Status: DC
  Administered 2012-10-11 – 2012-10-12 (×2): 1 via NASAL
  Filled 2012-10-10: qty 16

## 2012-10-10 MED ORDER — ONDANSETRON HCL 4 MG/2ML IJ SOLN: 4.0000 mg | Freq: Four times a day (QID) | INTRAMUSCULAR | Status: DC | PRN

## 2012-10-10 NOTE — H&P (Signed)
[facility]          History and Physical    PCP:   Hoyle Sauer, MD   Chief Complaint:  Dizziness, weakness  HPI: Patient is a very pleasant 72 year old white woman with past medical history only significant for hypertension and a bladder reconstruction secondary to bladder cancer. She catheterizes herself. She states that she started feeling dizzy about 4 days ago after spending a lot of time in the heat outside for a fund raiser. She went home and took a Dramamine and felt better afterwards. Yesterday she had 3 episodes of diarrhea which has worsened her dizziness. She went to see her primary care physician who sent her to the hospital for further evaluation. In the emergency department she is found to have a urinary tract infection and orthostasis and we have been asked to admit her for further evaluation and management. Of note she has been taking a weight loss supplement called slim trim.  Allergies:  No Known Allergies    Past Medical History  Diagnosis Date  . Breast cancer   . Bladder cancer   . 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 Mitral valve prolapse   . Hypertension     Past Surgical History  Procedure Laterality Date  . Replacement total knee Left   . Abdominal hysterectomy      Prior to Admission medications   Medication Sig Start Date End Date Taking? Authorizing Provider  acetaminophen (TYLENOL) 325 MG tablet Take 650 mg by mouth every 6 (six) hours as needed for pain.   Yes Historical Provider, MD  alendronate (FOSAMAX) 35 MG tablet Take 35 mg by mouth every 7 (seven) days. Take with a full glass of water on an empty stomach.   Yes Historical Provider, MD  aspirin 81 MG tablet Take 81 mg by mouth daily.   Yes Historical Provider, MD  fexofenadine (ALLEGRA) 60 MG tablet Take 60 mg by mouth daily as needed (allergies).    Yes Historical Provider, MD  fluticasone (FLONASE) 50 MCG/ACT nasal spray Place 1 spray into the nose  daily.  06/22/12  Yes Historical Provider, MD  lisinopril (PRINIVIL,ZESTRIL) 20 MG tablet Take 20 mg by mouth daily.   Yes Historical Provider, MD  omeprazole (PRILOSEC) 20 MG capsule Take 20 mg by mouth daily.  06/25/12  Yes Historical Provider, MD  VITAMIN B1-B12 IJ Inject as directed every 30 (thirty) days.   Yes Historical Provider, MD    Social History:  reports that she has never smoked. She does not have any smokeless tobacco history on file. She reports that she does not drink alcohol or use illicit drugs.  Family History  Problem Relation Age of Onset  . Asthma Sister     Review of Systems:  Constitutional: Denies fever, chills, diaphoresis, appetite change. HEENT: Denies photophobia, eye pain, redness, hearing loss, ear pain, congestion, sore throat, rhinorrhea, sneezing, mouth sores, trouble swallowing, neck pain, neck stiffness and tinnitus.   Respiratory: Denies SOB, DOE, cough, chest tightness,  and wheezing.   Cardiovascular: Denies chest pain, palpitations and leg swelling.  Gastrointestinal: Denies nausea, vomiting, abdominal pain, constipation, blood in stool and abdominal distention.  Genitourinary: Denies dysuria, urgency, frequency, hematuria, flank pain and difficulty urinating.  Endocrine: Denies: hot or cold intolerance, sweats, changes in hair or nails, polyuria, polydipsia. Musculoskeletal: Denies myalgias, back pain, joint swelling, arthralgias and gait problem.  Skin: Denies pallor, rash and wound.  Neurological: Denies seizures, syncope, weakness, light-headedness, numbness and  headaches.  Hematological: Denies adenopathy. Easy bruising, personal or family bleeding history  Psychiatric/Behavioral: Denies suicidal ideation, mood changes, confusion, nervousness, sleep disturbance and agitation   Physical Exam: Blood pressure 126/57, pulse 84, temperature 97.9 F (36.6 C), temperature source Oral, resp. rate 18, SpO2 100.00%. General: Alert, awake, oriented  x3. Neck: Supple, no JVD, no lymphadenopathy, no bruits, no goiter. HEENT: Normocephalic, atraumatic, pupils equal round and reactive to light, extraocular movements intact, dry mucous membranes with cracked lips and tongue. Cardiovascular: Tachycardic, regular rhythm, no murmurs, rubs or gallops. Lungs: Clear to auscultation bilaterally. Abdomen: Soft, nontender, nondistended, positive bowel sounds, no masses or organomegaly noted. Extremities: No clubbing, cyanosis or edema, positive pedal pulses. Neurologic: Grossly intact and nonfocal. I have not ambulated her.  Labs on Admission:  Results for orders placed during the hospital encounter of 10/10/12 (from the past 48 hour(s))  CBC     Status: Abnormal   Collection Time    10/10/12  3:04 PM      Result Value Range   WBC 11.0 (*) 4.0 - 10.5 K/uL   RBC 4.09  3.87 - 5.11 MIL/uL   Hemoglobin 12.7  12.0 - 15.0 g/dL   HCT 86.5  78.4 - 69.6 %   MCV 91.0  78.0 - 100.0 fL   MCH 31.1  26.0 - 34.0 pg   MCHC 34.1  30.0 - 36.0 g/dL   RDW 29.5  28.4 - 13.2 %   Platelets 419 (*) 150 - 400 K/uL  POCT I-STAT, CHEM 8     Status: Abnormal   Collection Time    10/10/12  3:39 PM      Result Value Range   Sodium 137  135 - 145 mEq/L   Potassium 4.7  3.5 - 5.1 mEq/L   Chloride 110  96 - 112 mEq/L   BUN 29 (*) 6 - 23 mg/dL   Creatinine, Ser 4.40  0.50 - 1.10 mg/dL   Glucose, Bld 95  70 - 99 mg/dL   Calcium, Ion 1.02  7.25 - 1.30 mmol/L   TCO2 22  0 - 100 mmol/L   Hemoglobin 12.9  12.0 - 15.0 g/dL   HCT 36.6  44.0 - 34.7 %  URINALYSIS, ROUTINE W REFLEX MICROSCOPIC     Status: Abnormal   Collection Time    10/10/12  3:41 PM      Result Value Range   Color, Urine YELLOW  YELLOW   APPearance CLOUDY (*) CLEAR   Specific Gravity, Urine 1.008  1.005 - 1.030   pH 6.0  5.0 - 8.0   Glucose, UA NEGATIVE  NEGATIVE mg/dL   Hgb urine dipstick MODERATE (*) NEGATIVE   Bilirubin Urine NEGATIVE  NEGATIVE   Ketones, ur NEGATIVE  NEGATIVE mg/dL   Protein, ur  NEGATIVE  NEGATIVE mg/dL   Urobilinogen, UA 0.2  0.0 - 1.0 mg/dL   Nitrite POSITIVE (*) NEGATIVE   Leukocytes, UA LARGE (*) NEGATIVE  URINE MICROSCOPIC-ADD ON     Status: Abnormal   Collection Time    10/10/12  3:41 PM      Result Value Range   Squamous Epithelial / LPF FEW (*) RARE   WBC, UA 11-20  <3 WBC/hpf   RBC / HPF 7-10  <3 RBC/hpf   Bacteria, UA MANY (*) RARE   Urine-Other LESS THAN 10 mL OF URINE SUBMITTED    PROTIME-INR     Status: None   Collection Time    10/10/12  4:00 PM  Result Value Range   Prothrombin Time 13.2  11.6 - 15.2 seconds   INR 1.01  0.00 - 1.49  APTT     Status: Abnormal   Collection Time    10/10/12  4:00 PM      Result Value Range   aPTT 41 (*) 24 - 37 seconds   Comment:            IF BASELINE aPTT IS ELEVATED,     SUGGEST PATIENT RISK ASSESSMENT     BE USED TO DETERMINE APPROPRIATE     ANTICOAGULANT THERAPY.  CBC     Status: Abnormal   Collection Time    10/10/12  4:00 PM      Result Value Range   WBC 12.1 (*) 4.0 - 10.5 K/uL   RBC 4.26  3.87 - 5.11 MIL/uL   Hemoglobin 12.9  12.0 - 15.0 g/dL   HCT 19.1  47.8 - 29.5 %   MCV 91.1  78.0 - 100.0 fL   MCH 30.3  26.0 - 34.0 pg   MCHC 33.2  30.0 - 36.0 g/dL   RDW 62.1  30.8 - 65.7 %   Platelets 461 (*) 150 - 400 K/uL  DIFFERENTIAL     Status: Abnormal   Collection Time    10/10/12  4:00 PM      Result Value Range   Neutrophils Relative % 75  43 - 77 %   Neutro Abs 9.1 (*) 1.7 - 7.7 K/uL   Lymphocytes Relative 18  12 - 46 %   Lymphs Abs 2.2  0.7 - 4.0 K/uL   Monocytes Relative 5  3 - 12 %   Monocytes Absolute 0.6  0.1 - 1.0 K/uL   Eosinophils Relative 1  0 - 5 %   Eosinophils Absolute 0.1  0.0 - 0.7 K/uL   Basophils Relative 0  0 - 1 %   Basophils Absolute 0.0  0.0 - 0.1 K/uL  COMPREHENSIVE METABOLIC PANEL     Status: Abnormal   Collection Time    10/10/12  4:00 PM      Result Value Range   Sodium 135  135 - 145 mEq/L   Potassium 4.8  3.5 - 5.1 mEq/L   Chloride 103  96 - 112  mEq/L   CO2 20  19 - 32 mEq/L   Glucose, Bld 89  70 - 99 mg/dL   BUN 26 (*) 6 - 23 mg/dL   Creatinine, Ser 8.46 (*) 0.50 - 1.10 mg/dL   Calcium 9.8  8.4 - 96.2 mg/dL   Total Protein 7.9  6.0 - 8.3 g/dL   Albumin 3.3 (*) 3.5 - 5.2 g/dL   AST 22  0 - 37 U/L   ALT 22  0 - 35 U/L   Alkaline Phosphatase 89  39 - 117 U/L   Total Bilirubin 0.3  0.3 - 1.2 mg/dL   GFR calc non Af Amer 44 (*) >90 mL/min   GFR calc Af Amer 51 (*) >90 mL/min   Comment:            The eGFR has been calculated     using the CKD EPI equation.     This calculation has not been     validated in all clinical     situations.     eGFR's persistently     <90 mL/min signify     possible Chronic Kidney Disease.  TROPONIN I     Status: None   Collection  Time    10/10/12  4:01 PM      Result Value Range   Troponin I <0.30  <0.30 ng/mL   Comment:            Due to the release kinetics of cTnI,     a negative result within the first hours     of the onset of symptoms does not rule out     myocardial infarction with certainty.     If myocardial infarction is still suspected,     repeat the test at appropriate intervals.  POCT I-STAT TROPONIN I     Status: None   Collection Time    10/10/12  4:18 PM      Result Value Range   Troponin i, poc 0.00  0.00 - 0.08 ng/mL   Comment 3            Comment: Due to the release kinetics of cTnI,     a negative result within the first hours     of the onset of symptoms does not rule out     myocardial infarction with certainty.     If myocardial infarction is still suspected,     repeat the test at appropriate intervals.    Radiological Exams on Admission: Ct Head Wo Contrast  10/10/2012   *RADIOLOGY REPORT*  Clinical Data: Near syncope.  CT HEAD WITHOUT CONTRAST  Technique:  Contiguous axial images were obtained from the base of the skull through the vertex without contrast.  Comparison: None.  Findings: There is mucosal thickening and possible fluid within the left sphenoid  sinus.  No acute bony abnormality.  No evidence for acute hemorrhage, mass lesion, midline shift, hydrocephalus or large infarct.  Focal low densities near the base of the right basal ganglia and the left external capsule.  Findings could be related to previous lacunar infarcts versus dilated perivascular spaces.  IMPRESSION: No acute intracranial abnormality.  Sphenoid sinus disease.  Question previous lacunar infarcts.   Original Report Authenticated By: Richarda Overlie, M.D.    Assessment/Plan Principal Problem:   Orthostatic hypotension Active Problems:   Dizziness   UTI (lower urinary tract infection)   HTN (hypertension)   Orthostatic hypotension -This is the cause of her dizziness. -Suspect related to decreased circulatory volume given prolonged exposure to heat, decreased fluid intake as well as self-limiting diarrhea and her urinary tract infection. -Will admit to the hospital to give some IV fluids. -Will obtain PT evaluation and recheck orthostatic vital signs in the morning.  Dizziness -Secondary to orthostasis. -See above for details.  Urinary tract infection -Continue Rocephin pending culture data. -Patient is prone to urinary tract infection given her bladder reconstruction and the fact that she has to self catheterize daily.  Hypertension -Will hold lisinopril given orthostasis.  DVT prophylaxis -Lovenox.  Time Spent on Admission: 75 minutes  HERNANDEZ ACOSTA,ESTELA [facility] Pager: [phone] 10/10/2012, 6:37 PM

## 2012-10-10 NOTE — ED Provider Notes (Signed)
2:37 PM  Date: 10/10/2012  Rate: 89  Rhythm: normal sinus rhythm  QRS Axis: normal  Intervals: normal QRS:  Early precordial R/S transition  ST/T Wave abnormalities: normal  Conduction Disutrbances:none  Narrative Interpretation: Borderline EKG  Old EKG Reviewed: none available    Carleene Cooper III, MD 10/10/12 1438

## 2012-10-10 NOTE — ED Provider Notes (Signed)
History    CSN: [id number] Arrival date & time 10/10/12  1414 First MD Initiated Contact with Patient 10/10/12 1518      Chief Complaint  Patient presents with  . Near Syncope    HPI Pt has been having dizzy spells the last few days.  She will feel lightheaded.  It first started Saturday when she was outdoors.  She tried some dramamine when she got home and after resting she felt fine.  She has been taking a slim trim tablet the past two months to try to curb her appetite.  This was something she purchased over the internet.  The spells usually occur various times.  Generally when she tries to stand up.    No episodes really the last two days until she was at the doctor's office today where she began feeling lightheaded again.  This time it was when she was sitting still in the chair. Her doctor sent her in for further evaluation,.  No slurred speech, no unilateral weakness.  No coordination trouble.  No chest pain or shortness of breath. Past Medical History  Diagnosis Date  . Breast cancer   . Bladder cancer   . 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 Mitral valve prolapse   . Hypertension     Past Surgical History  Procedure Laterality Date  . Replacement total knee Left   . Abdominal hysterectomy      Family History  Problem Relation Age of Onset  . Asthma Sister     History  Substance Use Topics  . Smoking status: Never Smoker   . Smokeless tobacco: Not on file  . Alcohol Use: No    OB History   Grav Para Term Preterm Abortions TAB SAB Ect Mult Living                  Review of Systems  All other systems reviewed and are negative.    Allergies  Review of patient's allergies indicates no known allergies.  Home Medications   Current Outpatient Rx  Name  Route  Sig  Dispense  Refill  . acetaminophen (TYLENOL) 325 MG tablet   Oral   Take 650 mg by mouth every 6 (six) hours as needed for pain.         [name] alendronate (FOSAMAX) 35 MG tablet  Oral   Take 35 mg by mouth every 7 (seven) days. Take with a full glass of water on an empty stomach.         [name] aspirin 81 MG tablet   Oral   Take 81 mg by mouth daily.         . fexofenadine (ALLEGRA) 60 MG tablet   Oral   Take 60 mg by mouth daily as needed (allergies).          . fluticasone (FLONASE) 50 MCG/ACT nasal spray   Nasal   Place 1 spray into the nose daily.          [name] lisinopril (PRINIVIL,ZESTRIL) 20 MG tablet   Oral   Take 20 mg by mouth daily.         [name] omeprazole (PRILOSEC) 20 MG capsule   Oral   Take 20 mg by mouth daily.          [name] VITAMIN B1-B12 IJ   Injection   Inject as directed every 30 (thirty) days.           BP 116/64  Pulse 84  Temp(Src)  97.9 F (36.6 C) (Oral)  Resp 17  SpO2 100%  Physical Exam  Nursing note and vitals reviewed. Constitutional: She is oriented to person, place, and time. She appears well-developed and well-nourished. No distress.  HENT:  Head: Normocephalic and atraumatic.  Right Ear: External ear normal.  Left Ear: External ear normal.  Mouth/Throat: Oropharynx is clear and moist.  Eyes: Conjunctivae are normal. Right eye exhibits no discharge. Left eye exhibits no discharge. No scleral icterus.  Neck: Neck supple. No tracheal deviation present.  Cardiovascular: Normal rate, regular rhythm and intact distal pulses.   Pulmonary/Chest: Effort normal and breath sounds normal. No stridor. No respiratory distress. She has no wheezes. She has no rales.  Abdominal: Soft. Bowel sounds are normal. She exhibits no distension. There is no tenderness. There is no rebound and no guarding.  Musculoskeletal: She exhibits no edema and no tenderness.  Neurological: She is alert and oriented to person, place, and time. She has normal strength. No cranial nerve deficit ( no gross defecits noted) or sensory deficit. She exhibits normal muscle tone. She displays no seizure activity. Coordination normal.  No pronator drift  bilateral upper extrem, able to hold both legs off bed for 5 seconds, sensation intact in all extremities, no visual field cuts, no left or right sided neglect  Skin: Skin is warm and dry. No rash noted.  Psychiatric: She has a normal mood and affect.    ED Course  Procedures (including critical care time) EKG Rate 89 SINUS RHYTHM ~ normal P axis, V-rate 50- 99 EARLY PRECORDIAL R/S TRANSITION ~ QRS area positive in V2 ARTIFACT IN LEAD(S) I,II,III,aVR,aVL,aVF  Medications  sodium chloride 0.9 % bolus 500 mL (not administered)  cefTRIAXone (ROCEPHIN) 1 g in dextrose 5 % 50 mL IVPB (not administered)  sodium chloride 0.9 % bolus 500 mL (500 mLs Intravenous New Bag/Given 10/10/12 1650)    Labs Reviewed  CBC - Abnormal; Notable for the following:    WBC 11.0 (*)    Platelets 419 (*)    All other components within normal limits  APTT - Abnormal; Notable for the following:    aPTT 41 (*)    All other components within normal limits  CBC - Abnormal; Notable for the following:    WBC 12.1 (*)    Platelets 461 (*)    All other components within normal limits  DIFFERENTIAL - Abnormal; Notable for the following:    Neutro Abs 9.1 (*)    All other components within normal limits  COMPREHENSIVE METABOLIC PANEL - Abnormal; Notable for the following:    BUN 26 (*)    Creatinine, Ser 1.20 (*)    Albumin 3.3 (*)    GFR calc non Af Amer 44 (*)    GFR calc Af Amer 51 (*)    All other components within normal limits  URINALYSIS, ROUTINE W REFLEX MICROSCOPIC - Abnormal; Notable for the following:    APPearance CLOUDY (*)    Hgb urine dipstick MODERATE (*)    Nitrite POSITIVE (*)    Leukocytes, UA LARGE (*)    All other components within normal limits  URINE MICROSCOPIC-ADD ON - Abnormal; Notable for the following:    Squamous Epithelial / LPF FEW (*)    Bacteria, UA MANY (*)    All other components within normal limits  POCT I-STAT, CHEM 8 - Abnormal; Notable for the following:    BUN 29  (*)    All other components within normal limits  URINE  CULTURE  PROTIME-INR  TROPONIN I  POCT I-STAT TROPONIN I   Ct Head Wo Contrast  10/10/2012   *RADIOLOGY REPORT*  Clinical Data: Near syncope.  CT HEAD WITHOUT CONTRAST  Technique:  Contiguous axial images were obtained from the base of the skull through the vertex without contrast.  Comparison: None.  Findings: There is mucosal thickening and possible fluid within the left sphenoid sinus.  No acute bony abnormality.  No evidence for acute hemorrhage, mass lesion, midline shift, hydrocephalus or large infarct.  Focal low densities near the base of the right basal ganglia and the left external capsule.  Findings could be related to previous lacunar infarcts versus dilated perivascular spaces.  IMPRESSION: No acute intracranial abnormality.  Sphenoid sinus disease.  Question previous lacunar infarcts.   Original Report Authenticated By: Richarda Overlie, M.D.     1. Dehydration   2. Urinary tract infection   3. Near syncope       MDM  Patient's laboratory tests show a slight increase in her creatinine as well as a urine tract infection. I suspect this is the cause of her dizziness and lightheadedness. She was orthostatic. Patient has started on IV fluidsand given 500 cc of IV fluids but she still does feel somewhat lightheaded when she tries to stand up. Considering her age, the severity of her symptoms I think it is reasonable to have her hospitalized for observation for IV antibiotics and IV fluids.  The patient agrees to this plan.        Celene Kras, MD 10/10/12 [phone]

## 2012-10-10 NOTE — ED Notes (Signed)
Pt sts still feels dizzy and tingling in bilateral arms

## 2012-10-10 NOTE — ED Notes (Signed)
Family at bedside. 

## 2012-10-10 NOTE — ED Notes (Addendum)
Per EMS: Pt has had episodes of dizziness lasting 30 sec x 1 week. Pt reports multiple episodes today. Reports that when she gets these she has "tingling to entire body", lightheaded. Denies syncope. AO x4. CBG: 72 VSS

## 2012-10-11 LAB — BASIC METABOLIC PANEL
CO2: 19 mEq/L (ref 19–32)
Calcium: 8.6 mg/dL (ref 8.4–10.5)
Creatinine, Ser: 1.08 mg/dL (ref 0.50–1.10)
GFR calc Af Amer: 58 mL/min — ABNORMAL LOW (ref >90)
GFR calc non Af Amer: 50 mL/min — ABNORMAL LOW (ref >90)
Sodium: 137 mEq/L (ref 135–145)

## 2012-10-11 LAB — CBC
Platelets: 390 10*3/uL (ref 150–400)
RBC: 3.67 MIL/uL — ABNORMAL LOW (ref 3.87–5.11)
RDW: 14.2 % (ref 11.5–15.5)
WBC: 8 10*3/uL (ref 4.0–10.5)

## 2012-10-11 NOTE — Progress Notes (Signed)
UR Completed.  Caitlin [name] [phone] 10/11/2012  

## 2012-10-11 NOTE — Plan of Care (Signed)
Problem: Phase I Progression Outcomes Goal: Voiding-avoid urinary catheter unless indicated Outcome: Not Applicable Date Met:  10/11/12 Pt i/o cath prn.

## 2012-10-11 NOTE — Evaluation (Signed)
Physical Therapy Evaluation Patient Details Name: Caitlin [name] MRN: [id number] DOB: [birth year] Today's Date: 10/11/2012 Time: 1191-4782 PT Time Calculation (min): 16 min  PT Assessment / Plan / Recommendation Clinical Impression  Pt is independent in moblity and gait on level surfaces stairs with no dizziness. Dynamic Gait Index 24/24 and BP 141/64 in standing after walking. No further PT or DME needed    PT Assessment  Patent does not need any further PT services    Follow Up Recommendations  No PT follow up    Does the patient have the potential to tolerate intense rehabilitation      Barriers to Discharge        Equipment Recommendations  None recommended by PT    Recommendations for Other Services     Frequency      Precautions / Restrictions     Pertinent Vitals/Pain No c/o pain or dizziness     Mobility  Bed Mobility Bed Mobility: Supine to Sit;Sit to Supine Supine to Sit: 7: Independent Sit to Supine: 7: Independent Transfers Transfers: Sit to Stand;Stand to Sit Sit to Stand: 7: Independent Stand to Sit: 7: Independent Ambulation/Gait Ambulation/Gait Assistance: 7: Independent Ambulation Distance (Feet): 300 Feet Assistive device: None Ambulation/Gait Assistance Details: no assist needed Gait Pattern: Within Functional Limits Gait velocity: WFL General Gait Details: WFL Stairs: Yes Stairs Assistance: 6: Modified independent (Device/Increase time) Stair Management Technique: One rail Right;Alternating pattern Number of Stairs: 2 Wheelchair Mobility Wheelchair Mobility: No    Exercises     PT Diagnosis:    PT Problem List:   PT Treatment Interventions:     PT Goals    Visit Information  Last PT Received On: 10/11/12 Assistance Needed: +1    Subjective Data  Subjective: I don't have any dizziness Patient Stated Goal: to go home tomorrow   Prior Functioning  Home Living Lives With: Spouse Available Help at Discharge: Family Type of Home:  House Home Access: Stairs to enter Secretary/administrator of Steps: 2 Entrance Stairs-Rails: Right;Left;Can reach both Home Layout: One level Home Adaptive Equipment: None Prior Function Level of Independence: Independent Able to Take Stairs?: Yes Vocation: Part time employment Communication Communication: No difficulties    Cognition  Cognition Arousal/Alertness: Awake/alert Behavior During Therapy: WFL for tasks assessed/performed Overall Cognitive Status: Within Functional Limits for tasks assessed    Extremity/Trunk Assessment Right Lower Extremity Assessment RLE ROM/Strength/Tone: Within functional levels RLE Sensation: WFL - Light Touch;WFL - Proprioception Left Lower Extremity Assessment LLE ROM/Strength/Tone: WFL for tasks assessed;Within functional levels LLE Sensation: WFL - Light Touch;WFL - Proprioception Trunk Assessment Trunk Assessment: Normal   Balance Balance Balance Assessed: Yes Static Sitting Balance Static Sitting - Balance Support: No upper extremity supported;Feet supported Static Sitting - Level of Assistance: 7: Independent Static Standing Balance Static Standing - Balance Support: No upper extremity supported Static Standing - Level of Assistance: 7: Independent Standardized Balance Assessment Standardized Balance Assessment: Dynamic Gait Index Dynamic Gait Index Level Surface: Normal Change in Gait Speed: Normal Gait with Horizontal Head Turns: Normal Gait with Vertical Head Turns: Normal Gait and Pivot Turn: Normal Step Around Obstacles: Normal Steps: Normal  End of Session PT - End of Session Activity Tolerance: Patient tolerated treatment well Patient left: in bed;with family/visitor present Nurse Communication: Mobility status  GP    Rosey Bath K. [place], [place] [phone] 10/11/2012, 3:17 PM

## 2012-10-11 NOTE — Progress Notes (Signed)
[facility] PROGRESS NOTE  Caitlin [name] QMV:[id number] DOB: 19-Sep-1940 DOA: 10/10/2012 PCP: Hoyle Sauer, MD  Assessment/Plan: 1. Orthostatic Hypotension: Patient feeling better. Repeat orthostatic in am. Still orthostatic today. Will increase IVF.   2. UTI: Continue with Ceftriaxone. Follow urine culture results.  3. Dizziness : resolved. 4. HTN: continue to hold lisinopril.  5. DVT prophylaxis: Continue with Lovenox.  6. Mild renal insufficiency: resolved with IV fluids.   Code Status: Full  Family Communication: Care discussed with patient.  Disposition Plan: probably DC 5-16. PT evaluation.    Consultants:  none  Procedures:  none  Antibiotics:  Ceftriaxone 5-14  HPI/Subjective: Feeling better today, denies dizziness.   Objective: Filed Vitals:   10/11/12 0434 10/11/12 1045 10/11/12 1046 10/11/12 1047  BP: 99/56 139/63 122/67 117/68  Pulse: 91 86 94 101  Temp: 98.3 F (36.8 C)     TempSrc: Oral     Resp: 18     Height:      Weight:      SpO2: 98%       Intake/Output Summary (Last 24 hours) at 10/11/12 1320 Last data filed at 10/11/12 0730  Gross per 24 hour  Intake    240 ml  Output      0 ml  Net    240 ml   Filed Weights   10/10/12 1939  Weight: 72.7 kg (160 lb 4.4 oz)    Exam:   General:  No distress.   Cardiovascular: S 1, S 2 RRR  Respiratory: CTA  Abdomen: BS present, obese, NT  Musculoskeletal: no edema.   Data Reviewed: Basic Metabolic Panel:  Recent Labs Lab 10/10/12 1539 10/10/12 1600 10/10/12 2014 10/11/12 0530  NA 137 135  --  137  K 4.7 4.8  --  5.0  CL 110 103  --  110  CO2  --  20  --  19  GLUCOSE 95 89  --  112*  BUN 29* 26*  --  21  CREATININE 1.10 1.20* 1.16* 1.08  CALCIUM  --  9.8  --  8.6   Liver Function Tests:  Recent Labs Lab 10/10/12 1600  AST 22  ALT 22  ALKPHOS 89  BILITOT 0.3  PROT 7.9  ALBUMIN 3.3*   No results found for this basename: LIPASE, AMYLASE,  in the last 168  hours No results found for this basename: AMMONIA,  in the last 168 hours CBC:  Recent Labs Lab 10/10/12 1504 10/10/12 1539 10/10/12 1600 10/10/12 2014 10/11/12 0530  WBC 11.0*  --  12.1* 9.0 8.0  NEUTROABS  --   --  9.1*  --   --   HGB 12.7 12.9 12.9 11.8* 11.1*  HCT 37.2 38.0 38.8 36.0 33.9*  MCV 91.0  --  91.1 91.1 92.4  PLT 419*  --  461* 413* 390   Cardiac Enzymes:  Recent Labs Lab 10/10/12 1601  TROPONINI <0.30   BNP (last 3 results) No results found for this basename: PROBNP,  in the last 8760 hours CBG: No results found for this basename: GLUCAP,  in the last 168 hours  No results found for this or any previous visit (from the past 240 hour(s)).   Studies: Ct Head Wo Contrast  10/10/2012   *RADIOLOGY REPORT*  Clinical Data: Near syncope.  CT HEAD WITHOUT CONTRAST  Technique:  Contiguous axial images were obtained from the base of the skull through the vertex without contrast.  Comparison: None.  Findings: There is mucosal thickening  and possible fluid within the left sphenoid sinus.  No acute bony abnormality.  No evidence for acute hemorrhage, mass lesion, midline shift, hydrocephalus or large infarct.  Focal low densities near the base of the right basal ganglia and the left external capsule.  Findings could be related to previous lacunar infarcts versus dilated perivascular spaces.  IMPRESSION: No acute intracranial abnormality.  Sphenoid sinus disease.  Question previous lacunar infarcts.   Original Report Authenticated By: Richarda Overlie, M.D.    Scheduled Meds: . aspirin EC  81 mg Oral Daily  . cefTRIAXone (ROCEPHIN)  IV  1 g Intravenous Q24H  . enoxaparin (LOVENOX) injection  40 mg Subcutaneous Q24H  . fluticasone  1 spray Each Nare Daily  . pantoprazole  40 mg Oral Daily  . sodium chloride  3 mL Intravenous Q12H   Continuous Infusions: . sodium chloride 100 mL/hr at 10/11/12 1610    Principal Problem:   Orthostatic hypotension Active Problems:    Dizziness   UTI (lower urinary tract infection)   HTN (hypertension)    Time spent: 35 minutes.     Caitlin [name]  [facility] Pager [phone]. If 7PM-7AM, please contact night-coverage at [email or web address], password [email or web address] 10/11/2012, 1:20 PM  LOS: 1 day

## 2012-10-12 LAB — URINE CULTURE
Colony Count: NO GROWTH
Culture: NO GROWTH

## 2012-10-12 MED ORDER — CEPHALEXIN 500 MG PO CAPS: 500.0000 mg | ORAL_CAPSULE | Freq: Two times a day (BID) | ORAL | Status: DC

## 2012-10-12 MED ORDER — LISINOPRIL 20 MG PO TABS: 20.0000 mg | ORAL_TABLET | Freq: Every day | ORAL | Status: DC

## 2012-10-12 NOTE — Progress Notes (Signed)
Dc instructions given to pt at this time re: f/u appts, diet, activity, s/s of problems to report to physician, mychart, and community resources.  Pt verbalized understanding of all instructions.  Telemetry removed.  IVs dc'd.  No s/s of any acute distress at dc.  BP WNL.

## 2012-10-12 NOTE — Progress Notes (Signed)
Discharge instructions provided by nurse.  Prescriptions provided.  Patient was stable and at baseline upon discharge. VSS. Patient verbalized understanding of instructions and had no questions.  Patient discharged via wheelchair accompanied by her Husband.

## 2012-10-12 NOTE — Discharge Summary (Addendum)
Physician Discharge Summary  Caitlin [name] ZOX:[id number] DOB: 08/22/1940 DOA: 10/10/2012  PCP: Hoyle Sauer, MD  Admit date: 10/10/2012 Discharge date: 10/12/2012  Time spent: 35 minutes  Recommendations for Outpatient Follow-up:  1. Needs to follow up with PCP for BP evaluation.  2. Please follow up repeat urine culture.   Discharge Diagnoses:    Orthostatic hypotension   Dizziness   UTI (lower urinary tract infection)   HTN (hypertension)   Discharge Condition: Stable  Diet recommendation: Heart Healthy  Filed Weights   10/10/12 1939  Weight: 72.7 kg (160 lb 4.4 oz)    History of present illness:  Patient is a very pleasant 72 year old white woman with past medical history only significant for hypertension and a bladder reconstruction secondary to bladder cancer. She catheterizes herself. She states that she started feeling dizzy about 4 days ago after spending a lot of time in the heat outside for a fund raiser. She went home and took a Dramamine and felt better afterwards. Yesterday she had 3 episodes of diarrhea which has worsened her dizziness. She went to see her primary care physician who sent her to the hospital for further evaluation. In the emergency department she is found to have a urinary tract infection and orthostasis and we have been asked to admit her for further evaluation and management. Of note she has been taking a weight loss supplement called slim trim.   Hospital Course:  Patient was admitted with dizziness, dehydration, orthostatic hypotension. She was treated with IV fluids. Dizziness has resolved. She is not orthostatic. She was also treated for UTI, UA with positive nitrates, WBC 11-20. Urine didn't growth any bacteria. There is another urine culture pending. This result will need to be follow up. She received Ceftriaxone for 2 days. She will be discharge on Keflex for 5 more days.   1. Orthostatic Hypotension: Patient feeling better. Repeat orthostatic  negative. Resolved with IV fluids.  2. UTI: Patient was treated with Ceftriaxone for 2 days. I will provide prescription, keflex for 5 more days. Initial urine culture with no growth. Follow urine culture results.  3. Dizziness : resolved. 4. HTN: will advised to start taking lisinopril in 2 days.  5. DVT prophylaxis: Continue with Lovenox.  6. Mild renal insufficiency: resolved with IV fluids.  7.   Procedures:  None  Consultations:  None  Discharge Exam: Filed Vitals:   10/12/12 0519 10/12/12 0520 10/12/12 0522 10/12/12 0820  BP: 120/47 132/70 150/67   Pulse: 90 90 96   Temp: 98.4 F (36.9 C)     TempSrc: Oral     Resp: 18   18  Height:      Weight:      SpO2: 100%       General: No distress.  Cardiovascular: S 1, S 2 RRR Respiratory: CTA  Discharge Instructions  Discharge Orders   Future Appointments Provider Department Dept Phone   07/25/2013 9:30 AM Lowella Dell, MD [facility] MEDICAL ONCOL[facility]Y [phone]   Future Orders Complete By Expires     Diet - low sodium heart healthy  As directed     Increase activity slowly  As directed         Medication List    TAKE these medications       acetaminophen 325 MG tablet  Commonly known as:  TYLENOL  Take 650 mg by mouth every 6 (six) hours as needed for pain.     alendronate 35 MG tablet  Commonly known as:  FOSAMAX  Take 35 mg by mouth every 7 (seven) days. Take with a full glass of water on an empty stomach.     aspirin 81 MG tablet  Take 81 mg by mouth daily.     cephALEXin 500 MG capsule  Commonly known as:  KEFLEX  Take 1 capsule (500 mg total) by mouth 2 (two) times daily.     fexofenadine 60 MG tablet  Commonly known as:  ALLEGRA  Take 60 mg by mouth daily as needed (allergies).     fluticasone 50 MCG/ACT nasal spray  Commonly known as:  FLONASE  Place 1 spray into the nose daily.     lisinopril 20 MG tablet  Commonly known as:  PRINIVIL,ZESTRIL  Take 1 tablet (20 mg  total) by mouth daily.     omeprazole 20 MG capsule  Commonly known as:  PRILOSEC  Take 20 mg by mouth daily.     VITAMIN B1-B12 IJ  Inject as directed every 30 (thirty) days.       No Known Allergies     Follow-up Information   Follow up with Hoyle Sauer, MD In 1 week.   Contact information:   [address] [facility] [place] [address] [phone]        The results of significant diagnostics from this hospitalization (including imaging, microbiology, ancillary and laboratory) are listed below for reference.    Significant Diagnostic Studies: Ct Head Wo Contrast  10/10/2012   *RADIOLOGY REPORT*  Clinical Data: Near syncope.  CT HEAD WITHOUT CONTRAST  Technique:  Contiguous axial images were obtained from the base of the skull through the vertex without contrast.  Comparison: None.  Findings: There is mucosal thickening and possible fluid within the left sphenoid sinus.  No acute bony abnormality.  No evidence for acute hemorrhage, mass lesion, midline shift, hydrocephalus or large infarct.  Focal low densities near the base of the right basal ganglia and the left external capsule.  Findings could be related to previous lacunar infarcts versus dilated perivascular spaces.  IMPRESSION: No acute intracranial abnormality.  Sphenoid sinus disease.  Question previous lacunar infarcts.   Original Report Authenticated By: Richarda Overlie, M.D.   Mm Digital Screening  09/27/2012   *RADIOLOGY REPORT*  Clinical Data: Screening. Left lumpectomy breast cancer 2005.  DIGITAL BILATERAL SCREENING MAMMOGRAM WITH CAD  Comparison:  Previous exams.  FINDINGS:  ACR Breast Density Category 2: There is a scattered fibroglandular pattern.  No suspicious masses, architectural distortion, or calcifications are present. Left lumpectomy changes are noted.  Images were processed with CAD.  IMPRESSION: No mammographic evidence of malignancy.  A result letter of this screening  mammogram will be mailed directly to the patient.  RECOMMENDATION: Screening mammogram in one year. (Code:SM-B-01Y)  BI-RADS CATEGORY 1:  Negative.   Original Report Authenticated By: Christiana Pellant, M.D.    Microbiology: Recent Results (from the past 240 hour(s))  URINE CULTURE     Status: None   Collection Time    10/10/12  3:41 PM      Result Value Range Status   Specimen Description URINE, CLEAN CATCH   Final   Special Requests NONE   Final   Culture  Setup Time 10/10/2012 16:41   Final   Colony Count PENDING   Incomplete   Culture Culture reincubated for better growth   Final   Report Status PENDING   Incomplete  URINE CULTURE     Status: None  Collection Time    10/11/12  3:15 AM      Result Value Range Status   Specimen Description URINE, CATHETERIZED   Final   Special Requests NONE   Final   Culture  Setup Time 10/11/2012 12:32   Final   Colony Count NO GROWTH   Final   Culture NO GROWTH   Final   Report Status 10/12/2012 FINAL   Final     Labs: Basic Metabolic Panel:  Recent Labs Lab 10/10/12 1539 10/10/12 1600 10/10/12 2014 10/11/12 0530  NA 137 135  --  137  K 4.7 4.8  --  5.0  CL 110 103  --  110  CO2  --  20  --  19  GLUCOSE 95 89  --  112*  BUN 29* 26*  --  21  CREATININE 1.10 1.20* 1.16* 1.08  CALCIUM  --  9.8  --  8.6   Liver Function Tests:  Recent Labs Lab 10/10/12 1600  AST 22  ALT 22  ALKPHOS 89  BILITOT 0.3  PROT 7.9  ALBUMIN 3.3*   No results found for this basename: LIPASE, AMYLASE,  in the last 168 hours No results found for this basename: AMMONIA,  in the last 168 hours CBC:  Recent Labs Lab 10/10/12 1504 10/10/12 1539 10/10/12 1600 10/10/12 2014 10/11/12 0530  WBC 11.0*  --  12.1* 9.0 8.0  NEUTROABS  --   --  9.1*  --   --   HGB 12.7 12.9 12.9 11.8* 11.1*  HCT 37.2 38.0 38.8 36.0 33.9*  MCV 91.0  --  91.1 91.1 92.4  PLT 419*  --  461* 413* 390   Cardiac Enzymes:  Recent Labs Lab 10/10/12 1601  TROPONINI <0.30    BNP: BNP (last 3 results) No results found for this basename: PROBNP,  in the last 8760 hours CBG: No results found for this basename: GLUCAP,  in the last 168 hours     Signed:  Chelsea Nusz  [facility] 10/12/2012, 8:36 AM

## 2012-10-12 NOTE — Plan of Care (Signed)
Problem: Phase II Progression Outcomes Goal: IV changed to normal saline lock Outcome: Completed/Met Date Met:  10/12/12 Dc home

## 2012-10-13 LAB — URINE CULTURE

## 2012-10-26 ENCOUNTER — Encounter: Payer: Self-pay | Admitting: Cardiology

## 2012-11-29 ENCOUNTER — Encounter: Payer: Self-pay | Admitting: Cardiology

## 2012-11-29 ENCOUNTER — Ambulatory Visit (INDEPENDENT_AMBULATORY_CARE_PROVIDER_SITE_OTHER): Payer: Medicare Other | Admitting: Cardiology

## 2012-11-29 VITALS — BP 108/68 | HR 84 | Ht 66.0 in | Wt 161.4 lb

## 2012-11-29 DIAGNOSIS — I491 Atrial premature depolarization: Secondary | ICD-10-CM | POA: Insufficient documentation

## 2012-11-29 DIAGNOSIS — R0989 Other specified symptoms and signs involving the circulatory and respiratory systems: Secondary | ICD-10-CM

## 2012-11-29 DIAGNOSIS — I951 Orthostatic hypotension: Secondary | ICD-10-CM

## 2012-11-29 NOTE — Assessment & Plan Note (Signed)
The patient has had no further episodes of orthostatic hypotension since discharge from the hospital.

## 2012-11-29 NOTE — Patient Instructions (Addendum)
Your physician recommends that you continue on your current medications as directed. Please refer to the Current Medication list given to you today.  Your physician has requested that you have an echocardiogram. Echocardiography is a painless test that uses sound waves to create images of your heart. It provides your doctor with information about the size and shape of your heart and how well your heart's chambers and valves are working. This procedure takes approximately one hour. There are no restrictions for this procedure.  AVOID CAFFEINE   Your physician wants you to follow-up in: 1 year ov/ekg You will receive a reminder letter in the mail two months in advance. If you don't receive a letter, please call our office to schedule the follow-up appointment.

## 2012-11-29 NOTE — Assessment & Plan Note (Signed)
The patient has mild dyspnea on exertion.  She does not have any signs or symptoms of congestive heart failure.[name]

## 2012-11-29 NOTE — Progress Notes (Signed)
Caitlin [name] Date of Birth:  1940/10/24 [facility] [address] [place], [place]  [address] [phone]         Fax   [phone]  History of Present Illness: This pleasant 72 year old woman is seen after a long absence.  She reminds me that we did a stress test on her about 9 years ago which was normal.  She has been told in the remote past that she may have mitral valve prolapse.  She has a history of hypertension followed closely by Dr. Felipa Eth.  She was recently hospitalized May 14-16, 2014 after having a severe dizzy spell in her doctor's office.  She had presyncope.  She was admitted and found to have dehydration and suspected urinary tract infection.  She herself does not feel that she had a urinary tract but that her pyuria reflecting the fact that she has a neobladder following bladder surgery by Dr. Retta Diones.  In the hospital she had a CT scan of her head which was normal and her electrocardiograms were normal.  Her lisinopril was resumed 2 days after discharge and  she is no longer on a diuretic.  The patient has not been experiencing any chest discomfort.  She has had occasional palpitations.   Current Outpatient Prescriptions  Medication Sig Dispense Refill  . acetaminophen (TYLENOL) 325 MG tablet Take 650 mg by mouth every 6 (six) hours as needed for pain.      [name] alendronate (FOSAMAX) 35 MG tablet Take 35 mg by mouth every 7 (seven) days. Take with a full glass of water on an empty stomach.      [name] aspirin 81 MG tablet Take 81 mg by mouth daily.      . fexofenadine (ALLEGRA) 60 MG tablet Take 60 mg by mouth daily as needed (allergies).       . fluticasone (FLONASE) 50 MCG/ACT nasal spray Place 1 spray into the nose daily.       [name] lisinopril (PRINIVIL,ZESTRIL) 20 MG tablet Take 1 tablet (20 mg total) by mouth daily.  30 tablet  0  . omeprazole (PRILOSEC) 20 MG capsule Take 20 mg by mouth daily.       [name] VITAMIN B1-B12 IJ Inject as directed every 30 (thirty) days.        No current facility-administered medications for this visit.    No Known Allergies  Patient Active Problem List   Diagnosis Date Noted  . PAC (premature atrial contraction) 11/29/2012  . DOE (dyspnea on exertion) 11/29/2012  . Orthostatic hypotension 10/10/2012  . Dizziness 10/10/2012  . UTI (lower urinary tract infection) 10/10/2012  . HTN (hypertension) 10/10/2012    History  Smoking status  . Never Smoker   Smokeless tobacco  . Not on file    History  Alcohol Use No    Family History  Problem Relation Age of Onset  . Asthma Sister     Review of Systems: Constitutional: no fever chills diaphoresis or fatigue or change in weight.  Head and neck: no hearing loss, no epistaxis, no photophobia or visual disturbance. Respiratory: No cough, shortness of breath or wheezing. Cardiovascular: No chest pain peripheral edema, palpitations. Gastrointestinal: No abdominal distention, no abdominal pain, no change in bowel habits hematochezia or melena. Genitourinary: No dysuria, no frequency, no urgency, no nocturia. Musculoskeletal:No arthralgias, no back pain, no gait disturbance or myalgias. Neurological: No dizziness, no headaches, no numbness, no seizures, no syncope, no weakness, no tremors. Hematologic: No lymphadenopathy, no easy bruising. Psychiatric:  No confusion, no hallucinations, no sleep disturbance.    Physical Exam: Filed Vitals:   11/29/12 0942  BP: 108/68  Pulse: 84   the general appearance reveals a well-developed well-nourished woman in no distress.The head and neck exam reveals pupils equal and reactive.  Extraocular movements are full.  There is no scleral icterus.  The mouth and pharynx are normal.  The neck is supple.  The carotids reveal no bruits.  The jugular venous pressure is normal.  The  thyroid is not enlarged.  There is no lymphadenopathy.  The chest is clear to percussion and auscultation.  There are no rales or rhonchi.  Expansion of the  chest is symmetrical.  The precordium is quiet.  The heart rhythm is regular with occasional PACs.  The first heart sound is normal.  The second heart sound is physiologically split.  There is no murmur gallop rub or click.  There is no abnormal lift or heave.  The abdomen is soft and nontender.  The bowel sounds are normal.  The liver and spleen are not enlarged.  There are no abdominal masses.  There are no abdominal bruits.  Extremities reveal good pedal pulses.  There is no phlebitis or edema.  There is no cyanosis or clubbing.  Strength is normal and symmetrical in all extremities.  There is no lateralizing weakness.  There are no sensory deficits.  The skin is warm and dry.  There is no rash.  EKG shows normal sinus rhythm with occasional PACs and no ischemic changes   Assessment / Plan: 1. past history of orthostatic hypotension improved off diuretics and she is trying to make sure she drinks plenty of water. 2. remote history of mitral valve prolapse although I do not hear any murmur or click on exam today. 3. essential hypertension 4. premature atrial beats exacerbated by caffeine 5. mild exertional dyspnea without objective evidence of congestive heart failure.  Recommendation: I have advised the patient to avoid foods and drinks containing caffeine. We will have the patient return for a two-dimensional echocardiogram to evaluate left ventricular systolic and diastolic function and to evaluate her mitral valve. Continue current medication. Recheck in one year for office visit and EKG or sooner when necessary if symptoms worsen

## 2012-11-29 NOTE — Assessment & Plan Note (Signed)
The patient has occasional sensation that her heart is skipping.  EKG today shows that she is having frequent premature atrial beats.  The patient does consume diet sodas in the morning for breakfast which contains caffeine.

## 2012-12-04 ENCOUNTER — Ambulatory Visit (HOSPITAL_COMMUNITY): Payer: Medicare Other | Attending: Cardiology

## 2012-12-04 DIAGNOSIS — I059 Rheumatic mitral valve disease, unspecified: Secondary | ICD-10-CM | POA: Insufficient documentation

## 2012-12-04 DIAGNOSIS — I10 Essential (primary) hypertension: Secondary | ICD-10-CM | POA: Insufficient documentation

## 2012-12-04 DIAGNOSIS — I491 Atrial premature depolarization: Secondary | ICD-10-CM

## 2012-12-04 DIAGNOSIS — R0602 Shortness of breath: Secondary | ICD-10-CM

## 2012-12-04 DIAGNOSIS — R0609 Other forms of dyspnea: Secondary | ICD-10-CM | POA: Insufficient documentation

## 2012-12-04 DIAGNOSIS — R0989 Other specified symptoms and signs involving the circulatory and respiratory systems: Secondary | ICD-10-CM | POA: Insufficient documentation

## 2012-12-04 NOTE — Progress Notes (Signed)
Echocardiogram performed.  

## 2012-12-06 ENCOUNTER — Telehealth: Payer: Self-pay | Admitting: *Deleted

## 2012-12-06 NOTE — Telephone Encounter (Signed)
Advised patient and sent copy to Dr Felipa Eth  Notes Recorded by Cassell Clement, MD on 12/04/2012 at 12:54 PM Please report. Left ventricular systolic and diastolic function was normal. The mitral valve showed some thickening but is functioning normally. Continue Present medicines

## 2013-02-14 ENCOUNTER — Encounter (INDEPENDENT_AMBULATORY_CARE_PROVIDER_SITE_OTHER): Payer: Self-pay | Admitting: General Surgery

## 2013-02-14 ENCOUNTER — Ambulatory Visit (INDEPENDENT_AMBULATORY_CARE_PROVIDER_SITE_OTHER): Payer: Medicare Other | Admitting: General Surgery

## 2013-02-14 ENCOUNTER — Telehealth (INDEPENDENT_AMBULATORY_CARE_PROVIDER_SITE_OTHER): Payer: Self-pay | Admitting: General Surgery

## 2013-02-14 ENCOUNTER — Telehealth: Payer: Self-pay | Admitting: Cardiology

## 2013-02-14 ENCOUNTER — Encounter (HOSPITAL_COMMUNITY): Payer: Self-pay

## 2013-02-14 VITALS — BP 124/80 | HR 89 | Temp 97.5°F | Ht 62.0 in | Wt 160.0 lb

## 2013-02-14 DIAGNOSIS — K810 Acute cholecystitis: Secondary | ICD-10-CM

## 2013-02-14 NOTE — Progress Notes (Signed)
Patient ID: Caitlin [name], female   DOB: 05/24/1941, 72 y.o.   MRN: [id number]  Chief Complaint  Patient presents with  . New Evaluation    eval gallbladder    HPI Caitlin [name] is a 72 y.o. female.  The patient is a 72-year-old female was referred by Dr. Avva  For evaluation of cholecystitis. He states that she began having abdominal pain as well as diarrhea approximately a month ago at the end of August. She states that discontinued in his time and was evaluated by her PCP/FNP with a CT scan which revealed signs consistent with cholecystitis. Patient was given antibiotics and deferred surgery at that time secondary to prior engagement.  Patient states she continues with abdominal pain at this time with some nausea.  HPI  Past Medical History  Diagnosis Date  . 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 Mitral valve prolapse   . Hypertension   . Breast cancer   . Bladder cancer   . GERD (gastroesophageal reflux disease)     Past Surgical History  Procedure Laterality Date  . Replacement total knee Left   . Abdominal hysterectomy  1980    total  . Breast surgery  2005    Left Breast Lumpectomy  . Bladder surgery  2009    due to cancer    Family History  Problem Relation Age of Onset  . Asthma Sister   . Stroke Mother   . Stroke Father     Social History History  Substance Use Topics  . Smoking status: Never Smoker   . Smokeless tobacco: Not on file  . Alcohol Use: No    No Known Allergies  Current Outpatient Prescriptions  Medication Sig Dispense Refill  . acetaminophen (TYLENOL) 325 MG tablet Take 650 mg by mouth every 6 (six) hours as needed for pain.      . alendronate (FOSAMAX) 35 MG tablet Take 35 mg by mouth every 7 (seven) days. Take with a full glass of water on an empty stomach.      . escitalopram (LEXAPRO) 10 MG tablet       . fexofenadine (ALLEGRA) 60 MG tablet Take 60 mg by mouth daily as needed (allergies).       . fluticasone (FLONASE) 50 MCG/ACT  nasal spray Place 1 spray into the nose daily.       . lisinopril (PRINIVIL,ZESTRIL) 20 MG tablet Take 1 tablet (20 mg total) by mouth daily.  30 tablet  0  . omeprazole (PRILOSEC) 20 MG capsule Take 20 mg by mouth daily.       . ondansetron (ZOFRAN) 4 MG tablet       . Probiotic Product (ALIGN PO) Take by mouth.      . VITAMIN B1-B12 IJ Inject as directed every 30 (thirty) days.      . aspirin 81 MG tablet Take 81 mg by mouth daily.      . ZOSTAVAX 19400 UNT/0.65ML injection        No current facility-administered medications for this visit.    Review of Systems Review of Systems  Constitutional: Negative.   HENT: Negative.   Respiratory: Negative.   Cardiovascular: Negative.   Gastrointestinal: Positive for abdominal pain.  Musculoskeletal: Negative.   All other systems reviewed and are negative.    Blood pressure 124/80, pulse 89, temperature 97.5 F (36.4 C), temperature source Temporal, height 5' 2" (1.575 m), weight 160 lb (72.576 kg), SpO2 98.00%.  Physical Exam Physical   Exam  Constitutional: She is oriented to person, place, and time. She appears well-developed and well-nourished.  HENT:  Head: Normocephalic and atraumatic.  Eyes: Conjunctivae and EOM are normal. Pupils are equal, round, and reactive to light.  Neck: Normal range of motion. Neck supple.  Cardiovascular: Normal rate, regular rhythm and normal heart sounds.   Pulmonary/Chest: Effort normal and breath sounds normal.  Abdominal: Soft. Bowel sounds are normal. She exhibits no distension and no mass. There is tenderness (RUq). There is no rebound and no guarding.  Musculoskeletal: Normal range of motion.  Neurological: She is alert and oriented to person, place, and time.  Skin: Skin is warm and dry.    Data Reviewed CT scan reveals dilated gallbladder, pericholecystic fluid and inflammation  Assessment    72-year-old female with signs of cholecystitis     Plan    1. We'll proceed to the  operating room for a laparoscopic cholecystectomy with IOC. 2. The patient has mitral valve prolapse and sees Dr. Brackbill. She saw him recently in July and has had no issues with this. 3.All risks and benefits were discussed with the patient to generally include: infection, bleeding, possible need for post op ERCP, damage to the bile ducts, and bile leak. Alternatives were offered and described.  All questions were answered and the patient voiced understanding of the procedure and wishes to proceed at this point with a laparoscopic cholecystectomy         Hue Steveson Jr., [place]aclara Spear 02/14/2013, 8:47 AM    

## 2013-02-14 NOTE — Telephone Encounter (Signed)
LM for Dr.Brackbill's nurse to call me back today when she get a chance re: CC..trying to get patient schd for surgery 9/24 per AR...need CC note that was routed in Epic back before then

## 2013-02-14 NOTE — Telephone Encounter (Signed)
Will forward to  Dr. Patty Sermons to see if cleared for surgery

## 2013-02-14 NOTE — Telephone Encounter (Signed)
Yes patient can be cleared for surgery from cardiac standpoint.

## 2013-02-14 NOTE — Telephone Encounter (Signed)
New problem    Status of cardiac clearance .  

## 2013-02-15 ENCOUNTER — Other Ambulatory Visit (HOSPITAL_COMMUNITY): Payer: Medicare Other

## 2013-02-15 ENCOUNTER — Encounter (HOSPITAL_COMMUNITY)
Admission: RE | Admit: 2013-02-15 | Discharge: 2013-02-15 | Disposition: A | Discharge status: Home / Self Care | Payer: Medicare Other | Source: Ambulatory Visit | Attending: General Surgery | Admitting: General Surgery

## 2013-02-15 ENCOUNTER — Encounter (HOSPITAL_COMMUNITY): Payer: Self-pay

## 2013-02-15 DIAGNOSIS — Z01812 Encounter for preprocedural laboratory examination: Secondary | ICD-10-CM | POA: Insufficient documentation

## 2013-02-15 HISTORY — DX: Chronic kidney disease, unspecified: N18.9

## 2013-02-15 HISTORY — DX: Unspecified osteoarthritis, unspecified site: M19.90

## 2013-02-15 LAB — CBC
HCT: 36.7 % (ref 36.0–46.0)
Hemoglobin: 11.4 g/dL — ABNORMAL LOW (ref 12.0–15.0)
MCH: 28.8 pg (ref 26.0–34.0)
MCHC: 31.1 g/dL (ref 30.0–36.0)
RDW: 14.1 % (ref 11.5–15.5)

## 2013-02-15 LAB — BASIC METABOLIC PANEL
BUN: 30 mg/dL — ABNORMAL HIGH (ref 6–23)
Creatinine, Ser: 1.25 mg/dL — ABNORMAL HIGH (ref 0.50–1.10)
GFR calc Af Amer: 48 mL/min — ABNORMAL LOW (ref >90)
GFR calc non Af Amer: 42 mL/min — ABNORMAL LOW (ref >90)
Glucose, Bld: 92 mg/dL (ref 70–99)

## 2013-02-15 MED ORDER — CHLORHEXIDINE GLUCONATE 4 % EX LIQD: 1.0000 "application " | Freq: Once | CUTANEOUS | Status: DC

## 2013-02-15 NOTE — Pre-Procedure Instructions (Signed)
Caitlin [name]  02/15/2013   Your procedure is scheduled on:   02-19-2013  Tuesday   Report to [facility] Stay [facility] at 5:30 AM.   Call this number if you have problems the morning of surgery: [phone]   Remember:   Do not eat food or drink liquids after midnight.    Take these medicines the morning of surgery with A SIP OF WATER: omeprazole   Do not wear jewelry, make-up or nail polish.  Do not wear lotions, powders, or perfumes.  Do not shave 48 hours prior to surgery.   Do not bring valuables to the hospital.  [facility] is not responsible  For any belongings 0r valuables.  Contacts, dentures or bridgework may not be worn into surgery.   Leave suitcase in the car. After surgery it may be brought to your room.   For patients admitted to the hospital, checkout time is 11:00 AM the day of discharge.   Patients discharged the day of surgery will not be allowed to drive home  Name and phone number of your driver:    Special Instructions: Shower using CHG 2 nights before surgery and the night before surgery.  If you shower the day of surgery use CHG.  Use special wash - you have one bottle of CHG for all showers.  You should use approximately 1/3 of the bottle for each shower.     Please read over the following fact sheets that you were given: Pain Booklet, Coughing and Deep Breathing and Surgical Site Infection Prevention

## 2013-02-15 NOTE — Telephone Encounter (Signed)
Will forward to [facility], and Dr A. Rameriz

## 2013-02-15 NOTE — Progress Notes (Signed)
Anesthesia Chart Review:  Patient is a 72 year old female scheduled for laparoscopic cholecystectomy on 02/19/13 by Dr. Derrell Lolling.  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  PCP is Dr. Felipa Eth. ONC is Dr. Darnelle Catalan, next visit 06/2013. Cardiologist is Dr. Patty Sermons who cleared patient for this procedure.  EKG on 11/29/12 showed SR with PACs, septal infarct (age undetermined).  Echo on 12/04/12 showed: - Left ventricle: The cavity size was normal. Wall thickness was normal. Systolic function was vigorous. The estimated ejection fraction was in the range of 65% to 70%. Wall motion was normal; there were no regional wall motion abnormalities. Doppler parameters are consistent with abnormal left ventricular relaxation (grade 1 diastolic dysfunction). - Mitral valve: Calcified annulus. Mildly thickened leaflets. No evidence of prolapse. - Atrial septum: There was increased thickness of the septum, consistent with lipomatous hypertrophy. - Tricuspid valve: Mild regurgitation. - Pulmonary arteries: Systolic pressure was mildly increased. PA peak pressure: 39mm Hg (S). - Pericardium, extracardiac: A trivial pericardial effusionwas identified.  CXR on 03/20/12 showed: No acute cardiopulmonary abnormality seen.  Preoperative labs noted.  Na 134, K 5.2, BUN 30, Cr 1.25, glucose 92.  WBC 8.8, H/H 11.4/36.7, PLT count 734K.  Previously PLT counts up to 460K.  Reviewed labs with anesthesiologist Dr. Noreene Larsson.  Consider secondary thrombocytosis related to inflammation/recent history of cholecystitis.  Defer further recommendations to Dr. Derrell Lolling at this point.  Anticipate she can proceed from an anesthesia standpoint.  Velna Ochs [facility] Short Stay Center/Anesthesiology Phone [phone] 02/15/2013 5:30  PM

## 2013-02-18 MED ORDER — CIPROFLOXACIN IN D5W 400 MG/200ML IV SOLN
400.0000 mg | INTRAVENOUS | Status: AC
  Administered 2013-02-19: 400 mg via INTRAVENOUS
  Filled 2013-02-18: qty 200

## 2013-02-19 ENCOUNTER — Ambulatory Visit (HOSPITAL_COMMUNITY): Payer: Medicare Other | Admitting: Anesthesiology

## 2013-02-19 ENCOUNTER — Encounter (HOSPITAL_COMMUNITY)
Admission: RE | Disposition: A | Discharge status: Home / Self Care | Payer: Self-pay | Source: Ambulatory Visit | Attending: General Surgery

## 2013-02-19 ENCOUNTER — Ambulatory Visit (HOSPITAL_COMMUNITY): Payer: Medicare Other

## 2013-02-19 ENCOUNTER — Ambulatory Visit (HOSPITAL_COMMUNITY)
Admission: RE | Admit: 2013-02-19 | Discharge: 2013-02-19 | Disposition: A | Discharge status: Home / Self Care | Payer: Medicare Other | Source: Ambulatory Visit | Attending: General Surgery | Admitting: General Surgery

## 2013-02-19 ENCOUNTER — Encounter (HOSPITAL_COMMUNITY): Payer: Self-pay | Admitting: Vascular Surgery

## 2013-02-19 ENCOUNTER — Encounter (HOSPITAL_COMMUNITY): Payer: Self-pay | Admitting: *Deleted

## 2013-02-19 DIAGNOSIS — I059 Rheumatic mitral valve disease, unspecified: Secondary | ICD-10-CM | POA: Insufficient documentation

## 2013-02-19 DIAGNOSIS — K8010 Calculus of gallbladder with chronic cholecystitis without obstruction: Secondary | ICD-10-CM

## 2013-02-19 DIAGNOSIS — K219 Gastro-esophageal reflux disease without esophagitis: Secondary | ICD-10-CM | POA: Insufficient documentation

## 2013-02-19 DIAGNOSIS — I10 Essential (primary) hypertension: Secondary | ICD-10-CM | POA: Insufficient documentation

## 2013-02-19 HISTORY — DX: Other specified health status: Z78.9

## 2013-02-19 HISTORY — PX: CHOLECYSTECTOMY: SHX55

## 2013-02-19 SURGERY — LAPAROSCOPIC CHOLECYSTECTOMY WITH INTRAOPERATIVE CHOLANGIOGRAM
Anesthesia: General | Site: Abdomen | Wound class: Clean Contaminated

## 2013-02-19 MED ORDER — SODIUM CHLORIDE 0.9 % IR SOLN
Status: DC | PRN
  Administered 2013-02-19 (×2): 1000 mL

## 2013-02-19 MED ORDER — SODIUM CHLORIDE 0.9 % IV SOLN
INTRAVENOUS | Status: DC | PRN
  Administered 2013-02-19 (×2): via INTRAVENOUS

## 2013-02-19 MED ORDER — ONDANSETRON HCL 4 MG/2ML IJ SOLN
4.0000 mg | Freq: Once | INTRAMUSCULAR | Status: AC | PRN
  Administered 2013-02-19: 4 mg via INTRAVENOUS

## 2013-02-19 MED ORDER — EPHEDRINE SULFATE 50 MG/ML IJ SOLN
INTRAMUSCULAR | Status: DC | PRN
  Administered 2013-02-19: 15 mg via INTRAVENOUS
  Administered 2013-02-19: 10 mg via INTRAVENOUS

## 2013-02-19 MED ORDER — HYDROMORPHONE HCL PF 1 MG/ML IJ SOLN: 0.2500 mg | INTRAMUSCULAR | Status: DC | PRN

## 2013-02-19 MED ORDER — FENTANYL CITRATE 0.05 MG/ML IJ SOLN
INTRAMUSCULAR | Status: DC | PRN
Start: 2013-02-19 — End: 2013-02-19
  Administered 2013-02-19: 100 ug via INTRAVENOUS

## 2013-02-19 MED ORDER — 0.9 % SODIUM CHLORIDE (POUR BTL) OPTIME
TOPICAL | Status: DC | PRN
  Administered 2013-02-19: 1000 mL

## 2013-02-19 MED ORDER — IOHEXOL 300 MG/ML  SOLN
INTRAMUSCULAR | Status: DC | PRN
  Administered 2013-02-19: 50 mL via INTRAVENOUS

## 2013-02-19 MED ORDER — PHENYLEPHRINE HCL 10 MG/ML IJ SOLN
INTRAMUSCULAR | Status: DC | PRN
  Administered 2013-02-19 (×2): 80 ug via INTRAVENOUS
  Administered 2013-02-19 (×2): 120 ug via INTRAVENOUS

## 2013-02-19 MED ORDER — GLYCOPYRROLATE 0.2 MG/ML IJ SOLN
INTRAMUSCULAR | Status: DC | PRN
  Administered 2013-02-19: 0.4 mg via INTRAVENOUS

## 2013-02-19 MED ORDER — MIDAZOLAM HCL 5 MG/5ML IJ SOLN
INTRAMUSCULAR | Status: DC | PRN
  Administered 2013-02-19: 2 mg via INTRAVENOUS

## 2013-02-19 MED ORDER — ARTIFICIAL TEARS OP OINT
TOPICAL_OINTMENT | OPHTHALMIC | Status: DC | PRN
  Administered 2013-02-19: 1 via OPHTHALMIC

## 2013-02-19 MED ORDER — OXYCODONE-ACETAMINOPHEN 10-325 MG PO TABS: 1.0000 | ORAL_TABLET | ORAL | Status: DC | PRN

## 2013-02-19 MED ORDER — ONDANSETRON HCL 4 MG/2ML IJ SOLN
INTRAMUSCULAR | Status: DC | PRN
  Administered 2013-02-19: 4 mg via INTRAVENOUS

## 2013-02-19 MED ORDER — NEOSTIGMINE METHYLSULFATE 1 MG/ML IJ SOLN
INTRAMUSCULAR | Status: DC | PRN
  Administered 2013-02-19: 3 mg via INTRAVENOUS

## 2013-02-19 MED ORDER — BUPIVACAINE HCL 0.25 % IJ SOLN
INTRAMUSCULAR | Status: DC | PRN
  Administered 2013-02-19: 30 mL

## 2013-02-19 MED ORDER — ROCURONIUM BROMIDE 100 MG/10ML IV SOLN
INTRAVENOUS | Status: DC | PRN
  Administered 2013-02-19: 40 mg via INTRAVENOUS

## 2013-02-19 MED ORDER — PROPOFOL 10 MG/ML IV BOLUS
INTRAVENOUS | Status: DC | PRN
  Administered 2013-02-19: 150 mg via INTRAVENOUS

## 2013-02-19 MED ORDER — LACTATED RINGERS IV SOLN
INTRAVENOUS | Status: DC | PRN
  Administered 2013-02-19: 07:00:00 via INTRAVENOUS

## 2013-02-19 MED ORDER — ONDANSETRON HCL 4 MG/2ML IJ SOLN
INTRAMUSCULAR | Status: DC
Start: 2013-02-19 — End: 2013-02-19
  Filled 2013-02-19: qty 2

## 2013-02-19 MED ORDER — LIDOCAINE HCL (CARDIAC) 20 MG/ML IV SOLN
INTRAVENOUS | Status: DC | PRN
  Administered 2013-02-19: 40 mg via INTRAVENOUS

## 2013-02-19 SURGICAL SUPPLY — 45 items
APPLIER CLIP 5 13 M/L LIGAMAX5 (MISCELLANEOUS) ×2
BENZOIN TINCTURE PRP APPL 2/3 (GAUZE/BANDAGES/DRESSINGS) ×2 IMPLANT
CANISTER SUCTION 2500CC (MISCELLANEOUS) ×2 IMPLANT
CHLORAPREP W/TINT 26ML (MISCELLANEOUS) ×2 IMPLANT
CLIP APPLIE 5 13 M/L LIGAMAX5 (MISCELLANEOUS) ×1 IMPLANT
CLIP LIGATING HEMO O LOK GREEN (MISCELLANEOUS) ×4 IMPLANT
CLOTH BEACON ORANGE TIMEOUT ST (SAFETY) ×2 IMPLANT
COVER MAYO STAND STRL (DRAPES) ×2 IMPLANT
COVER SURGICAL LIGHT HANDLE (MISCELLANEOUS) ×2 IMPLANT
COVER TRANSDUCER ULTRASND (DRAPES) ×2 IMPLANT
DEVICE TROCAR PUNCTURE CLOSURE (ENDOMECHANICALS) ×2 IMPLANT
DRAPE C-ARM 42X72 X-RAY (DRAPES) ×2 IMPLANT
DRAPE UTILITY 15X26 W/TAPE STR (DRAPE) ×4 IMPLANT
ELECT REM PT RETURN 9FT ADLT (ELECTROSURGICAL) ×2
ELECTRODE REM PT RTRN 9FT ADLT (ELECTROSURGICAL) ×1 IMPLANT
GAUZE SPONGE 2X2 8PLY STRL LF (GAUZE/BANDAGES/DRESSINGS) ×1 IMPLANT
GLOVE BIO SURGEON STRL SZ7.5 (GLOVE) ×2 IMPLANT
GLOVE BIOGEL PI IND STRL 6.5 (GLOVE) ×1 IMPLANT
GLOVE BIOGEL PI IND STRL 7.0 (GLOVE) ×1 IMPLANT
GLOVE BIOGEL PI INDICATOR 6.5 (GLOVE) ×1
GLOVE BIOGEL PI INDICATOR 7.0 (GLOVE) ×1
GLOVE SURG SS PI 7.0 STRL IVOR (GLOVE) ×2 IMPLANT
GOWN STRL NON-REIN LRG LVL3 (GOWN DISPOSABLE) ×6 IMPLANT
GOWN STRL REIN XL XLG (GOWN DISPOSABLE) ×2 IMPLANT
IV CATH 14GX2 1/4 (CATHETERS) ×2 IMPLANT
KIT BASIN OR (CUSTOM PROCEDURE TRAY) ×2 IMPLANT
KIT ROOM TURNOVER OR (KITS) ×2 IMPLANT
NEEDLE INSUFFLATION 14GA 120MM (NEEDLE) ×2 IMPLANT
NS IRRIG 1000ML POUR BTL (IV SOLUTION) ×2 IMPLANT
PAD ARMBOARD 7.5X6 YLW CONV (MISCELLANEOUS) ×4 IMPLANT
POUCH SPECIMEN RETRIEVAL 10MM (ENDOMECHANICALS) IMPLANT
SCISSORS LAP 5X35 DISP (ENDOMECHANICALS) ×2 IMPLANT
SET CHOLANGIOGRAPHY FRANKLIN (SET/KITS/TRAYS/PACK) ×2 IMPLANT
SET IRRIG TUBING LAPAROSCOPIC (IRRIGATION / IRRIGATOR) ×2 IMPLANT
SLEEVE ENDOPATH XCEL 5M (ENDOMECHANICALS) ×2 IMPLANT
SPECIMEN JAR SMALL (MISCELLANEOUS) ×2 IMPLANT
SPONGE GAUZE 2X2 STER 10/PKG (GAUZE/BANDAGES/DRESSINGS) ×1
STRIP CLOSURE SKIN 1/2X4 (GAUZE/BANDAGES/DRESSINGS) ×2 IMPLANT
SUT MNCRL AB 3-0 PS2 18 (SUTURE) ×2 IMPLANT
TAPE CLOTH SURG 4X10 WHT LF (GAUZE/BANDAGES/DRESSINGS) ×2 IMPLANT
TOWEL OR 17X24 6PK STRL BLUE (TOWEL DISPOSABLE) ×2 IMPLANT
TOWEL OR 17X26 10 PK STRL BLUE (TOWEL DISPOSABLE) ×2 IMPLANT
TRAY LAPAROSCOPIC (CUSTOM PROCEDURE TRAY) ×2 IMPLANT
TROCAR XCEL NON-BLD 11X100MML (ENDOMECHANICALS) ×2 IMPLANT
TROCAR XCEL NON-BLD 5MMX100MML (ENDOMECHANICALS) ×2 IMPLANT

## 2013-02-19 NOTE — Transfer of Care (Signed)
Immediate Anesthesia Transfer of Care Note  Patient: Caitlin [name]  Procedure(s) Performed: Procedure(s): LAPAROSCOPIC CHOLECYSTECTOMY WITH INTRAOPERATIVE CHOLANGIOGRAM (N/A)  Patient Location: PACU  Anesthesia Type:General  Level of Consciousness: awake, alert , oriented and patient cooperative  Airway & Oxygen Therapy: Patient Spontanous Breathing and Patient connected to nasal cannula oxygen  Post-op Assessment: Report given to PACU RN, Post -op Vital signs reviewed and stable and Patient moving all extremities  Post vital signs: Reviewed and stable  Complications: No apparent anesthesia complications

## 2013-02-19 NOTE — Anesthesia Postprocedure Evaluation (Signed)
  Anesthesia Post-op Note  Patient: Caitlin [name]  Procedure(s) Performed: Procedure(s): LAPAROSCOPIC CHOLECYSTECTOMY WITH INTRAOPERATIVE CHOLANGIOGRAM (N/A)  Patient Location: PACU  Anesthesia Type:General  Level of Consciousness: awake, oriented, sedated and patient cooperative  Airway and Oxygen Therapy: Patient Spontanous Breathing  Post-op Pain: mild  Post-op Assessment: Post-op Vital signs reviewed, Patient's Cardiovascular Status Stable, Respiratory Function Stable, Patent Airway, No signs of Nausea or vomiting and Pain level controlled  Post-op Vital Signs: stable  Complications: No apparent anesthesia complications

## 2013-02-19 NOTE — H&P (View-Only) (Signed)
Patient ID: Caitlin [name], female   DOB: 1941/01/17, 72 y.o.   MRN: [id number]  Chief Complaint  Patient presents with  . New Evaluation    eval gallbladder    HPI Caitlin [name] is a 72 y.o. female.  The patient is a 72 year old female was referred by Dr. Felipa Eth  For evaluation of cholecystitis. He states that she began having abdominal pain as well as diarrhea approximately a month ago at the end of August. She states that discontinued in his time and was evaluated by her PCP/FNP with a CT scan which revealed signs consistent with cholecystitis. Patient was given antibiotics and deferred surgery at that time secondary to prior engagement.  Patient states she continues with abdominal pain at this time with some nausea.  HPI  Past Medical History  Diagnosis Date  . 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 Mitral valve prolapse   . Hypertension   . Breast cancer   . Bladder cancer   . GERD (gastroesophageal reflux disease)     Past Surgical History  Procedure Laterality Date  . Replacement total knee Left   . Abdominal hysterectomy  1980    total  . Breast surgery  2005    Left Breast Lumpectomy  . Bladder surgery  2009    due to cancer    Family History  Problem Relation Age of Onset  . Asthma Sister   . Stroke Mother   . Stroke Father     Social History History  Substance Use Topics  . Smoking status: Never Smoker   . Smokeless tobacco: Not on file  . Alcohol Use: No    No Known Allergies  Current Outpatient Prescriptions  Medication Sig Dispense Refill  . acetaminophen (TYLENOL) 325 MG tablet Take 650 mg by mouth every 6 (six) hours as needed for pain.      [name] alendronate (FOSAMAX) 35 MG tablet Take 35 mg by mouth every 7 (seven) days. Take with a full glass of water on an empty stomach.      . escitalopram (LEXAPRO) 10 MG tablet       . fexofenadine (ALLEGRA) 60 MG tablet Take 60 mg by mouth daily as needed (allergies).       . fluticasone (FLONASE) 50 MCG/ACT  nasal spray Place 1 spray into the nose daily.       [name] lisinopril (PRINIVIL,ZESTRIL) 20 MG tablet Take 1 tablet (20 mg total) by mouth daily.  30 tablet  0  . omeprazole (PRILOSEC) 20 MG capsule Take 20 mg by mouth daily.       . ondansetron (ZOFRAN) 4 MG tablet       . Probiotic Product (ALIGN PO) Take by mouth.      [name] VITAMIN B1-B12 IJ Inject as directed every 30 (thirty) days.      [name] aspirin 81 MG tablet Take 81 mg by mouth daily.      [name] ZOSTAVAX 65784 UNT/0.65ML injection        No current facility-administered medications for this visit.    Review of Systems Review of Systems  Constitutional: Negative.   HENT: Negative.   Respiratory: Negative.   Cardiovascular: Negative.   Gastrointestinal: Positive for abdominal pain.  Musculoskeletal: Negative.   All other systems reviewed and are negative.    Blood pressure 124/80, pulse 89, temperature 97.5 F (36.4 C), temperature source Temporal, height 5\' 2"  (1.575 m), weight 160 lb (72.576 kg), SpO2 98.00%.  Physical Exam Physical  Exam  Constitutional: She is oriented to person, place, and time. She appears well-developed and well-nourished.  HENT:  Head: Normocephalic and atraumatic.  Eyes: Conjunctivae and EOM are normal. Pupils are equal, round, and reactive to light.  Neck: Normal range of motion. Neck supple.  Cardiovascular: Normal rate, regular rhythm and normal heart sounds.   Pulmonary/Chest: Effort normal and breath sounds normal.  Abdominal: Soft. Bowel sounds are normal. She exhibits no distension and no mass. There is tenderness (RUq). There is no rebound and no guarding.  Musculoskeletal: Normal range of motion.  Neurological: She is alert and oriented to person, place, and time.  Skin: Skin is warm and dry.    Data Reviewed CT scan reveals dilated gallbladder, pericholecystic fluid and inflammation  Assessment    72 year old female with signs of cholecystitis     Plan    1. We'll proceed to the  operating room for a laparoscopic cholecystectomy with IOC. 2. The patient has mitral valve prolapse and sees Dr. Patty Sermons. She saw him recently in July and has had no issues with this. 3.All risks and benefits were discussed with the patient to generally include: infection, bleeding, possible need for post op ERCP, damage to the bile ducts, and bile leak. Alternatives were offered and described.  All questions were answered and the patient voiced understanding of the procedure and wishes to proceed at this point with a laparoscopic cholecystectomy         Marigene Ehlers., [facility] 02/14/2013, 8:47 AM

## 2013-02-19 NOTE — Preoperative (Signed)
Beta Blockers   Reason not to administer Beta Blockers:Not Applicable 

## 2013-02-19 NOTE — Op Note (Signed)
Pre Operative Diagnosis:  cholecystitis  Post Operative Diagnosis: same  Procedure: Lap chole with IOC  Surgeon: Dr. Axel Filler  Assistant: none  Anesthesia: GETA  EBL: 10cc  Complications: none  Counts: reported as correct x 2  Findings:  Normal IOC, short cystic duct  Indications for procedure:  Pt is a 72 y/o F who had a RUQ [place] which revealed signs c/w cholecystitis.  She was placed on abx and asked to f/u with surgery.  Upon consultation she decided to have her gallbladder removed.  Details of the procedure:The patient was taken to the operating and placed in the supine position with bilateral SCDs in place. A time out was called and all facts were verified. A pneumoperitoneum was obtained via A Veress needle technique to a pressure of 14mm of mercury. A 5mm trochar was then placed in the right upper quadrant under visualization, and there were no injuries to any abdominal organs. A 11 mm port was then placed in the umbilical region after infiltrating with local anesthesia under direct visualization. A second epigastric port was placed under direct visualization. The gallbladder was identified and retracted, the peritoneum was then sharply dissected from the gallbladder and this dissection was carried down to Calot's triangle. The cystic duct was identified and stripped away circumferentially and seen going into the gallbladder 360. A Cook catheter was used to perform an intraoperative cholangiogram. The biliary radicals as well as the cystic duct and common bile duct were seen free of filling defects.  2 clips were placed proximally one distally and the cystic duct transected. The cystic artery was identified and 2 clips placed proximally and one distally and transected.  We then proceeded to remove the gallbladder off the hepatic fossa with Bovie cautery. A retrieval bag was then placed in the abdomen and gallbladder placed in the bag. The hepatic fossa was then reexamined and  hemostasis was achieved with laparoscopic specula and Bovie cautery and was excellent at this portion of the case. The subhepatic fossa and perihepatic fossa was then irrigated until the effluent was clear. The 11 mm trocar fascia was reapproximated with the Endo Close #1 Vicryl x2.  The pneumoperitoneum was evacuated and all trochars removed under direct visulalization.  The skin was then closed with 4-0 Monocryl and the skin dressed with Steri-Strips, gauze, and tape.  The patient was awaken from general anesthesia and taken to the recovery room in stable condition.

## 2013-02-19 NOTE — Anesthesia Preprocedure Evaluation (Signed)
Anesthesia Evaluation  Patient identified by MRN, date of birth, ID band Patient awake    Reviewed: Allergy & Precautions, H&P , NPO status , Patient's Chart, lab work & pertinent test results  Airway       Dental   Pulmonary          Cardiovascular hypertension, + DOE + dysrhythmias + Valvular Problems/Murmurs MVP     Neuro/Psych    GI/Hepatic GERD-  ,  Endo/Other    Renal/GU Renal InsufficiencyRenal disease     Musculoskeletal   Abdominal   Peds  Hematology   Anesthesia Other Findings   Reproductive/Obstetrics                           Anesthesia Physical Anesthesia Plan  ASA: II  Anesthesia Plan: General   Post-op Pain Management:    Induction: Intravenous  Airway Management Planned: Oral ETT  Additional Equipment:   Intra-op Plan:   Post-operative Plan: Extubation in OR  Informed Consent: I have reviewed the patients History and Physical, chart, labs and discussed the procedure including the risks, benefits and alternatives for the proposed anesthesia with the patient or authorized representative who has indicated his/her understanding and acceptance.     Plan Discussed with: CRNA, Anesthesiologist and Surgeon  Anesthesia Plan Comments:         Anesthesia Quick Evaluation

## 2013-02-19 NOTE — Interval H&P Note (Signed)
History and Physical Interval Note:  02/19/2013 6:34 AM  Caitlin [name]  has presented today for surgery, with the diagnosis of gallstones  The various methods of treatment have been discussed with the patient and family. After consideration of risks, benefits and other options for treatment, the patient has consented to  Procedure(s): LAPAROSCOPIC CHOLECYSTECTOMY WITH INTRAOPERATIVE CHOLANGIOGRAM (N/A) as a surgical intervention .  The patient's history has been reviewed, patient examined, no change in status, stable for surgery.  I have reviewed the patient's chart and labs.  Questions were answered to the patient's satisfaction.     Marigene Ehlers., Jed Limerick

## 2013-02-19 NOTE — Anesthesia Procedure Notes (Signed)
Procedure Name: Intubation Date/Time: 02/19/2013 7:23 AM Performed by: Jerilee Hoh Pre-anesthesia Checklist: Emergency Drugs available, Patient identified, Suction available and Patient being monitored Patient Re-evaluated:Patient Re-evaluated prior to inductionOxygen Delivery Method: Circle system utilized Preoxygenation: Pre-oxygenation with 100% oxygen Intubation Type: IV induction Ventilation: Mask ventilation without difficulty Laryngoscope Size: Miller and 2 Grade View: Grade III Tube type: Oral Tube size: 7.0 mm Number of attempts: 2 Airway Equipment and Method: Stylet Placement Confirmation: ETT inserted through vocal cords under direct vision,  positive ETCO2 and breath sounds checked- equal and bilateral Secured at: 20 cm Tube secured with: Tape Dental Injury: Teeth and Oropharynx as per pre-operative assessment  Difficulty Due To: Difficulty was anticipated and Difficult Airway- due to anterior larynx Comments: Attempted to intubate with MAC 3, esophageal intubation immediately recognized.  Atraumatic oral intubation with Hyacinth Meeker 2 by Dr. Jacklynn Bue.

## 2013-02-21 ENCOUNTER — Encounter (INDEPENDENT_AMBULATORY_CARE_PROVIDER_SITE_OTHER): Payer: Self-pay

## 2013-02-21 ENCOUNTER — Encounter (HOSPITAL_COMMUNITY): Payer: Self-pay | Admitting: General Surgery

## 2013-03-05 ENCOUNTER — Encounter (INDEPENDENT_AMBULATORY_CARE_PROVIDER_SITE_OTHER): Payer: Self-pay | Admitting: General Surgery

## 2013-03-05 ENCOUNTER — Ambulatory Visit (INDEPENDENT_AMBULATORY_CARE_PROVIDER_SITE_OTHER): Payer: Medicare Other | Admitting: General Surgery

## 2013-03-05 VITALS — BP 128/78 | HR 86 | Temp 98.0°F | Resp 18 | Ht 62.0 in | Wt 159.2 lb

## 2013-03-05 DIAGNOSIS — Z9049 Acquired absence of other specified parts of digestive tract: Secondary | ICD-10-CM

## 2013-03-05 DIAGNOSIS — Z9889 Other specified postprocedural states: Secondary | ICD-10-CM

## 2013-03-05 NOTE — Progress Notes (Signed)
Patient ID: KEONI HAVEY, female   DOB: Feb 19, 1941, 72 y.o.   MRN: [id number] The patient is a 72 year old female status post laparoscopic cholecystectomy. Patient has been doing well postoperatively. She does state she has some urgency with bowel movements but is getting better.  Pathology: Revealed chronic cholecystitis, cholelithiasis, cholesterolosis. This was discussed with the patient.  On exam: The wound is clean dry and intact  Assessment and plan: 72 year old female status post laparoscopic cholecystectomy 1. Patient to follow up as 2. We discussed a low-fat diet

## 2013-03-27 ENCOUNTER — Other Ambulatory Visit: Payer: Self-pay | Admitting: Urology

## 2013-03-27 ENCOUNTER — Ambulatory Visit (HOSPITAL_COMMUNITY)
Admission: RE | Admit: 2013-03-27 | Discharge: 2013-03-27 | Disposition: A | Discharge status: Home / Self Care | Payer: Medicare Other | Source: Ambulatory Visit | Attending: Urology | Admitting: Urology

## 2013-03-27 DIAGNOSIS — Z853 Personal history of malignant neoplasm of breast: Secondary | ICD-10-CM | POA: Insufficient documentation

## 2013-03-27 DIAGNOSIS — L905 Scar conditions and fibrosis of skin: Secondary | ICD-10-CM | POA: Insufficient documentation

## 2013-03-27 DIAGNOSIS — C679 Malignant neoplasm of bladder, unspecified: Secondary | ICD-10-CM

## 2013-07-25 ENCOUNTER — Ambulatory Visit: Payer: Medicare Other | Admitting: Oncology

## 2013-08-13 ENCOUNTER — Other Ambulatory Visit: Payer: Self-pay

## 2013-08-13 DIAGNOSIS — Z9889 Other specified postprocedural states: Secondary | ICD-10-CM

## 2013-08-13 DIAGNOSIS — Z1231 Encounter for screening mammogram for malignant neoplasm of breast: Secondary | ICD-10-CM

## 2013-08-15 ENCOUNTER — Other Ambulatory Visit: Payer: Self-pay | Admitting: Internal Medicine

## 2013-08-15 DIAGNOSIS — M8580 Other specified disorders of bone density and structure, unspecified site: Secondary | ICD-10-CM

## 2013-09-30 ENCOUNTER — Ambulatory Visit
Admission: RE | Admit: 2013-09-30 | Discharge: 2013-09-30 | Disposition: A | Discharge status: Home / Self Care | Payer: Medicare Other | Source: Ambulatory Visit

## 2013-09-30 ENCOUNTER — Ambulatory Visit: Payer: Medicare Other

## 2013-09-30 ENCOUNTER — Ambulatory Visit
Admission: RE | Admit: 2013-09-30 | Discharge: 2013-09-30 | Disposition: A | Discharge status: Home / Self Care | Payer: Medicare Other | Source: Ambulatory Visit | Attending: Internal Medicine | Admitting: Internal Medicine

## 2013-09-30 ENCOUNTER — Encounter (INDEPENDENT_AMBULATORY_CARE_PROVIDER_SITE_OTHER): Payer: Self-pay

## 2013-09-30 DIAGNOSIS — Z9889 Other specified postprocedural states: Secondary | ICD-10-CM

## 2013-09-30 DIAGNOSIS — M8580 Other specified disorders of bone density and structure, unspecified site: Secondary | ICD-10-CM

## 2013-09-30 DIAGNOSIS — Z1231 Encounter for screening mammogram for malignant neoplasm of breast: Secondary | ICD-10-CM

## 2013-12-04 ENCOUNTER — Encounter: Payer: Self-pay | Admitting: Cardiology

## 2013-12-04 ENCOUNTER — Ambulatory Visit (INDEPENDENT_AMBULATORY_CARE_PROVIDER_SITE_OTHER): Payer: Medicare Other | Admitting: Cardiology

## 2013-12-04 VITALS — BP 139/79 | HR 77 | Ht 62.0 in | Wt 172.0 lb

## 2013-12-04 DIAGNOSIS — I10 Essential (primary) hypertension: Secondary | ICD-10-CM

## 2013-12-04 DIAGNOSIS — R0989 Other specified symptoms and signs involving the circulatory and respiratory systems: Secondary | ICD-10-CM

## 2013-12-04 DIAGNOSIS — I491 Atrial premature depolarization: Secondary | ICD-10-CM

## 2013-12-04 DIAGNOSIS — R0609 Other forms of dyspnea: Secondary | ICD-10-CM

## 2013-12-04 NOTE — Progress Notes (Signed)
Caitlin [name] Date of Birth:  09-10-1940 [name] [address] [name], [name]  [id number] [phone]        Fax   [phone]   History of Present Illness: This pleasant 73 year old woman is seen after a long absence. She reminds me that we did a stress test on her about 9 years ago which was normal. She has been told in the remote past that she may have mitral valve prolapse. She has a history of hypertension followed closely by Dr. Dagmar Hait. She was recently hospitalized May 14-16, 2014 after having a severe dizzy spell in her doctor's office. She had presyncope.  The patient had a subsequent echocardiogram 12/04/12 which showed grade 1 diastolic dysfunction.  She had a normal ejection fraction at 65-70%.  There was mild mitral valve thickening.  Since last year she has had no further episodes of presyncope or syncope.  Current Outpatient Prescriptions  Medication Sig Dispense Refill  . acetaminophen (TYLENOL) 325 MG tablet Take 650 mg by mouth every 6 (six) hours as needed for pain.      [name] alendronate (FOSAMAX) 35 MG tablet Take 35 mg by mouth every 7 (seven) days. Take with a full glass of water on an empty stomach.      [name] aspirin 81 MG tablet Take 81 mg by mouth daily.      . citalopram (CELEXA) 10 MG tablet Take 10 mg by mouth at bedtime.      . fexofenadine (ALLEGRA) 60 MG tablet Take 60 mg by mouth daily as needed (allergies).       . fluticasone (FLONASE) 50 MCG/ACT nasal spray Place 1 spray into the nose daily as needed.       [name] lisinopril (PRINIVIL,ZESTRIL) 20 MG tablet Take 1 tablet (20 mg total) by mouth daily.  30 tablet  0  . omeprazole (PRILOSEC) 20 MG capsule Take 20 mg by mouth daily.       . ondansetron (ZOFRAN) 4 MG tablet Take 4 mg by mouth 2 (two) times daily as needed for nausea.       [name] oxyCODONE-acetaminophen (PERCOCET) 10-325 MG per tablet Take 1 tablet by mouth every 4 (four) hours as needed for pain.  30 tablet  0  . Probiotic Product (ALIGN  PO) Take 1 capsule by mouth daily.       [name] VITAMIN B1-B12 IJ Inject as directed every 30 (thirty) days.       No current facility-administered medications for this visit.    No Known Allergies  Patient Active Problem List   Diagnosis Date Noted  . PAC (premature atrial contraction) 11/29/2012  . DOE (dyspnea on exertion) 11/29/2012  . Orthostatic hypotension 10/10/2012  . Dizziness 10/10/2012  . UTI (lower urinary tract infection) 10/10/2012  . HTN (hypertension) 10/10/2012    History  Smoking status  . Never Smoker   Smokeless tobacco  . Not on file    History  Alcohol Use No    Family History  Problem Relation Age of Onset  . Asthma Sister   . Stroke Mother   . Stroke Father     Review of Systems: Constitutional: no fever chills diaphoresis or fatigue or change in weight.  Head and neck: no hearing loss, no epistaxis, no photophobia or visual disturbance. Respiratory: No cough, shortness of breath or wheezing. Cardiovascular: No chest pain peripheral edema, palpitations. Gastrointestinal: No abdominal distention, no abdominal pain, no change in bowel habits hematochezia or melena.  Genitourinary: No dysuria, no frequency, no urgency, no nocturia. Musculoskeletal:No arthralgias, no back pain, no gait disturbance or myalgias. Neurological: No dizziness, no headaches, no numbness, no seizures, no syncope, no weakness, no tremors. Hematologic: No lymphadenopathy, no easy bruising. Psychiatric: No confusion, no hallucinations, no sleep disturbance.    Physical Exam: Filed Vitals:   12/04/13 0819  BP: 139/79  Pulse: 77   general appearance reveals a well-developed well-nourished woman in no distress.The head and neck exam reveals pupils equal and reactive.  Extraocular movements are full.  There is no scleral icterus.  The mouth and pharynx are normal.  The neck is supple.  The carotids reveal no bruits.  The jugular venous pressure is normal.  The  thyroid is not  enlarged.  There is no lymphadenopathy.  The chest is clear to percussion and auscultation.  There are no rales or rhonchi.  Expansion of the chest is symmetrical.  The precordium is quiet.  The first heart sound is normal.  The second heart sound is physiologically split.  There is no murmur gallop rub or click.  There is no abnormal lift or heave.  The abdomen is soft and nontender.  The bowel sounds are normal.  The liver and spleen are not enlarged.  There are no abdominal masses.  There are no abdominal bruits.  Extremities reveal good pedal pulses.  There is no phlebitis or edema.  There is no cyanosis or clubbing.  Strength is normal and symmetrical in all extremities.  There is no lateralizing weakness.  There are no sensory deficits.  The skin is warm and dry.  There is no rash.  EKG shows normal sinus rhythm and is unchanged since 11/29/12  Assessment / Plan: 1. essential hypertension without heart failure 2. diastolic dysfunction by echo 3. mild exogenous obesity 4. status post bladder surgery with neobladder by Dr. Vernie Shanks  Plan continue current medication.  Try to lose weight.  Try to increase regular aerobic exercise. Recheck in one year for office visit and EKG

## 2013-12-04 NOTE — Assessment & Plan Note (Signed)
Patient is aware of occasional isolated palpitations.  No sustained arrhythmia.

## 2013-12-04 NOTE — Assessment & Plan Note (Signed)
Patient has a history of mild exertional dyspnea.  This is multifactorial and probably reflects her diastolic dysfunction as well as her 11 pound weight gain since last year

## 2013-12-04 NOTE — Patient Instructions (Signed)
INCREASE YOUR EXERCISE AND WORK ON WEIGHT LOSS  Your physician recommends that you continue on your current medications as directed. Please refer to the Current Medication list given to you today.  Your physician wants you to follow-up in: [place] will receive a reminder letter in the mail two months in advance. If you don't receive a letter, please call our office to schedule the follow-up appointment.

## 2013-12-04 NOTE — Assessment & Plan Note (Signed)
Blood pressure was remaining stable on current therapy.  No dizziness or syncope.  No chest pain

## 2013-12-20 ENCOUNTER — Encounter: Payer: Self-pay | Admitting: Cardiology

## 2014-04-02 ENCOUNTER — Other Ambulatory Visit: Payer: Self-pay | Admitting: Urology

## 2014-04-02 ENCOUNTER — Ambulatory Visit (HOSPITAL_COMMUNITY)
Admission: RE | Admit: 2014-04-02 | Discharge: 2014-04-02 | Disposition: A | Discharge status: Home / Self Care | Payer: Medicare Other | Source: Ambulatory Visit | Attending: Urology | Admitting: Urology

## 2014-04-02 DIAGNOSIS — Z8551 Personal history of malignant neoplasm of bladder: Secondary | ICD-10-CM | POA: Insufficient documentation

## 2014-04-02 DIAGNOSIS — R05 Cough: Secondary | ICD-10-CM | POA: Diagnosis not present

## 2014-09-01 ENCOUNTER — Other Ambulatory Visit: Payer: Self-pay

## 2014-09-01 DIAGNOSIS — Z1231 Encounter for screening mammogram for malignant neoplasm of breast: Secondary | ICD-10-CM

## 2014-10-03 ENCOUNTER — Ambulatory Visit
Admission: RE | Admit: 2014-10-03 | Discharge: 2014-10-03 | Disposition: A | Discharge status: Home / Self Care | Payer: Medicare Other | Source: Ambulatory Visit

## 2014-10-03 DIAGNOSIS — Z1231 Encounter for screening mammogram for malignant neoplasm of breast: Secondary | ICD-10-CM

## 2014-12-04 ENCOUNTER — Ambulatory Visit (INDEPENDENT_AMBULATORY_CARE_PROVIDER_SITE_OTHER): Payer: Medicare Other | Admitting: Cardiology

## 2014-12-04 VITALS — BP 126/60 | HR 72 | Ht 62.0 in | Wt 175.0 lb

## 2014-12-04 DIAGNOSIS — I491 Atrial premature depolarization: Secondary | ICD-10-CM | POA: Diagnosis not present

## 2014-12-04 DIAGNOSIS — R0609 Other forms of dyspnea: Secondary | ICD-10-CM

## 2014-12-04 DIAGNOSIS — I10 Essential (primary) hypertension: Secondary | ICD-10-CM

## 2014-12-04 NOTE — Patient Instructions (Signed)
Medication Instructions:  Your physician recommends that you continue on your current medications as directed. Please refer to the Current Medication list given to you today.  Labwork: NONE  Testing/Procedures: NONE  Follow-Up: Your physician wants you to follow-up in: 1 YEAR OV/EKG  You will receive a reminder letter in the mail two months in advance. If you don't receive a letter, please call our office to schedule the follow-up appointment.    

## 2014-12-04 NOTE — Progress Notes (Signed)
Cardiology Office Note   Date:  12/04/2014   ID:  Caitlin [name], DOB [birth year], MRN [id number]  PCP:  Tivis Ringer, MD  Cardiologist: Darlin Coco MD  No chief complaint on file.     History of Present Illness: Caitlin [name] is a 74 y.o. female who presents for a one-year follow-up office visit.  This pleasant 73 year old woman is seen for a one-year follow-up office visit.  She reminds me that we did a stress test on her about 10 years ago which was normal. She has been told in the remote past that she may have mitral valve prolapse. She has a history of hypertension followed closely by Dr. Dagmar Hait. She was hospitalized May 14-16, 2014 after having a severe dizzy spell in her doctor's office. She had presyncope. The patient had a subsequent echocardiogram 12/04/12 which showed grade 1 diastolic dysfunction. She had a normal ejection fraction at 65-70%. There was mild mitral valve thickening. Since last visit she has had no further episodes of presyncope or syncope.  She has a past history of premature atrial contractions.  She has not been aware of any recent palpitations.  She is able to drink diet Coke without causing palpitations.  However sweet tea often causes palpitations. Denies any exertional chest pain or angina.  She does have some mild exertional dyspnea worse in the hot humid weather. She has a past history of bladder cancer.  She has a neobladder.  She self catheterizes herself. She is followed by Dr. Dorina Hoyer.    Past Medical History  Diagnosis Date  . 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 Mitral valve prolapse   . Hypertension   . Breast cancer   . Bladder cancer   . GERD (gastroesophageal reflux disease)   . Chronic kidney disease     does not empty bladder,self cath  . Arthritis   . Self-catheterizes urinary bladder     Past Surgical History  Procedure Laterality Date  . Replacement total knee Left   . Abdominal hysterectomy  1980    total  .  Breast surgery  2005    Left Breast Lumpectomy  . Bladder surgery  2009    due to cancer  . Cholecystectomy N/A 02/19/2013    Procedure: LAPAROSCOPIC CHOLECYSTECTOMY WITH INTRAOPERATIVE CHOLANGIOGRAM;  Surgeon: Ralene Ok, MD;  Location: [facility];  Service: General;  Laterality: N/A;     Current Outpatient Prescriptions  Medication Sig Dispense Refill  . acetaminophen (TYLENOL) 325 MG tablet Take 650 mg by mouth every 6 (six) hours as needed for pain.    [name] alendronate (FOSAMAX) 35 MG tablet Take 35 mg by mouth every 7 (seven) days. Take with a full glass of water on an empty stomach.    [name] aspirin 81 MG tablet Take 81 mg by mouth daily.    . citalopram (CELEXA) 10 MG tablet Take 10 mg by mouth at bedtime.    . fexofenadine (ALLEGRA) 60 MG tablet Take 60 mg by mouth daily as needed (allergies).     . fluticasone (FLONASE) 50 MCG/ACT nasal spray Place 1 spray into the nose daily as needed.     [name] lisinopril (PRINIVIL,ZESTRIL) 20 MG tablet Take 1 tablet (20 mg total) by mouth daily. 30 tablet 0  . omeprazole (PRILOSEC) 20 MG capsule Take 20 mg by mouth daily.     . ondansetron (ZOFRAN) 4 MG tablet Take 4 mg by mouth 2 (two) times daily as needed for nausea.     [name]  oxyCODONE-acetaminophen (PERCOCET) 10-325 MG per tablet Take 1 tablet by mouth every 4 (four) hours as needed for pain. 30 tablet 0  . Probiotic Product (ALIGN PO) Take 1 capsule by mouth daily.     [name] VITAMIN B1-B12 IJ Inject as directed every 30 (thirty) days.     No current facility-administered medications for this visit.    Allergies:   Review of patient's allergies indicates no known allergies.    Social History:  The patient  reports that she has never smoked. She does not have any smokeless tobacco history on file. She reports that she does not drink alcohol or use illicit drugs.   Family History:  The patient's family history includes Asthma in her sister; Stroke in her father and mother.    ROS:  Please see the  history of present illness.   Otherwise, review of systems are positive for none.   All other systems are reviewed and negative.    PHYSICAL EXAM: VS:  BP 126/60 mmHg  Pulse 72  Ht 5\' 2"  (1.575 m)  Wt 175 lb (79.379 kg)  BMI 32.00 kg/m2 , BMI Body mass index is 32 kg/(m^2). GEN: Well nourished, well developed, in no acute distress HEENT: normal Neck: no JVD, carotid bruits, or masses Cardiac: RRR; no murmurs, rubs, or gallops,no edema  Respiratory:  clear to auscultation bilaterally, normal work of breathing GI: soft, nontender, nondistended, + BS MS: no deformity or atrophy Skin: warm and dry, no rash Neuro:  Strength and sensation are intact Psych: euthymic mood, full affect   EKG:  EKG is ordered today. The ekg ordered today demonstrates normal sinus rhythm with minor nonspecific ST-T wave changes.   Recent Labs: No results found for requested labs within last 365 days.    Lipid Panel No results found for: CHOL, TRIG, HDL, CHOLHDL, VLDL, LDLCALC, LDLDIRECT    Wt Readings from Last 3 Encounters:  12/04/14 175 lb (79.379 kg)  12/04/13 172 lb (78.019 kg)  03/05/13 159 lb 3.2 oz (72.213 kg)        ASSESSMENT AND PLAN:  1. essential hypertension without heart failure 2. diastolic dysfunction by echo 3. mild exogenous obesity 4. status post bladder surgery with neobladder by Dr. Vernie Shanks 5.  Past history of PACs  Plan continue current medication. Try to lose weight. Try to increase regular aerobic exercise. Recheck in one year for office visit and EKG   Current medicines are reviewed at length with the patient today.  The patient does not have concerns regarding medicines.  The following changes have been made:  no change  Labs/ tests ordered today include:   Orders Placed This Encounter  Procedures  . EKG 12-Lead      Signed, Darlin Coco MD 12/04/2014 8:46 AM    [place], [place], [place]   [id number] Phone: [phone]; Fax: [phone]

## 2015-04-01 ENCOUNTER — Ambulatory Visit (HOSPITAL_COMMUNITY)
Admission: RE | Admit: 2015-04-01 | Discharge: 2015-04-01 | Disposition: A | Discharge status: Home / Self Care | Payer: Medicare Other | Source: Ambulatory Visit | Attending: Urology | Admitting: Urology

## 2015-04-01 ENCOUNTER — Other Ambulatory Visit: Payer: Self-pay | Admitting: Urology

## 2015-04-01 DIAGNOSIS — R918 Other nonspecific abnormal finding of lung field: Secondary | ICD-10-CM | POA: Insufficient documentation

## 2015-04-01 DIAGNOSIS — J42 Unspecified chronic bronchitis: Secondary | ICD-10-CM | POA: Insufficient documentation

## 2015-04-01 DIAGNOSIS — J189 Pneumonia, unspecified organism: Secondary | ICD-10-CM | POA: Diagnosis present

## 2015-09-02 ENCOUNTER — Other Ambulatory Visit: Payer: Self-pay

## 2015-09-02 DIAGNOSIS — Z1231 Encounter for screening mammogram for malignant neoplasm of breast: Secondary | ICD-10-CM

## 2015-10-05 ENCOUNTER — Ambulatory Visit
Admission: RE | Admit: 2015-10-05 | Discharge: 2015-10-05 | Disposition: A | Discharge status: Home / Self Care | Payer: Medicare Other | Source: Ambulatory Visit

## 2015-10-05 DIAGNOSIS — Z1231 Encounter for screening mammogram for malignant neoplasm of breast: Secondary | ICD-10-CM

## 2016-01-25 ENCOUNTER — Ambulatory Visit (HOSPITAL_COMMUNITY): Payer: Medicare Other | Admitting: Certified Registered Nurse Anesthetist

## 2016-01-25 ENCOUNTER — Observation Stay (HOSPITAL_COMMUNITY)
Admission: RE | Admit: 2016-01-25 | Discharge: 2016-01-27 | Disposition: A | Discharge status: Home / Self Care | Payer: Medicare Other | Source: Ambulatory Visit | Attending: Urology | Admitting: Urology

## 2016-01-25 ENCOUNTER — Encounter (HOSPITAL_COMMUNITY): Payer: Self-pay | Admitting: *Deleted

## 2016-01-25 ENCOUNTER — Encounter (HOSPITAL_COMMUNITY)
Admission: RE | Disposition: A | Discharge status: Home / Self Care | Payer: Self-pay | Source: Ambulatory Visit | Attending: Urology

## 2016-01-25 ENCOUNTER — Other Ambulatory Visit: Payer: Self-pay | Admitting: Urology

## 2016-01-25 DIAGNOSIS — C680 Malignant neoplasm of urethra: Principal | ICD-10-CM | POA: Insufficient documentation

## 2016-01-25 DIAGNOSIS — I10 Essential (primary) hypertension: Secondary | ICD-10-CM | POA: Insufficient documentation

## 2016-01-25 DIAGNOSIS — N289 Disorder of kidney and ureter, unspecified: Secondary | ICD-10-CM

## 2016-01-25 DIAGNOSIS — Z853 Personal history of malignant neoplasm of breast: Secondary | ICD-10-CM | POA: Insufficient documentation

## 2016-01-25 DIAGNOSIS — N362 Urethral caruncle: Secondary | ICD-10-CM | POA: Diagnosis present

## 2016-01-25 DIAGNOSIS — K219 Gastro-esophageal reflux disease without esophagitis: Secondary | ICD-10-CM | POA: Insufficient documentation

## 2016-01-25 DIAGNOSIS — Z96659 Presence of unspecified artificial knee joint: Secondary | ICD-10-CM | POA: Insufficient documentation

## 2016-01-25 DIAGNOSIS — I959 Hypotension, unspecified: Secondary | ICD-10-CM | POA: Diagnosis not present

## 2016-01-25 DIAGNOSIS — Z8551 Personal history of malignant neoplasm of bladder: Secondary | ICD-10-CM | POA: Insufficient documentation

## 2016-01-25 DIAGNOSIS — N390 Urinary tract infection, site not specified: Secondary | ICD-10-CM | POA: Diagnosis present

## 2016-01-25 DIAGNOSIS — E875 Hyperkalemia: Secondary | ICD-10-CM | POA: Diagnosis not present

## 2016-01-25 DIAGNOSIS — Z79899 Other long term (current) drug therapy: Secondary | ICD-10-CM | POA: Insufficient documentation

## 2016-01-25 DIAGNOSIS — D62 Acute posthemorrhagic anemia: Secondary | ICD-10-CM | POA: Diagnosis not present

## 2016-01-25 DIAGNOSIS — Z906 Acquired absence of other parts of urinary tract: Secondary | ICD-10-CM | POA: Diagnosis not present

## 2016-01-25 DIAGNOSIS — N3000 Acute cystitis without hematuria: Secondary | ICD-10-CM | POA: Diagnosis not present

## 2016-01-25 DIAGNOSIS — Z7982 Long term (current) use of aspirin: Secondary | ICD-10-CM | POA: Insufficient documentation

## 2016-01-25 DIAGNOSIS — Z90710 Acquired absence of both cervix and uterus: Secondary | ICD-10-CM | POA: Diagnosis not present

## 2016-01-25 HISTORY — DX: Other complications of anesthesia, initial encounter: T88.59XA

## 2016-01-25 HISTORY — DX: Other specified postprocedural states: Z98.890

## 2016-01-25 HISTORY — PX: ANTERIOR AND POSTERIOR REPAIR: SHX5121

## 2016-01-25 HISTORY — DX: Nausea with vomiting, unspecified: R11.2

## 2016-01-25 HISTORY — DX: Adverse effect of unspecified anesthetic, initial encounter: T41.45XA

## 2016-01-25 LAB — CBC
HEMATOCRIT: 37.1 % (ref 36.0–46.0)
Hemoglobin: 11.7 g/dL — ABNORMAL LOW (ref 12.0–15.0)
MCH: 29.3 pg (ref 26.0–34.0)
MCHC: 31.5 g/dL (ref 30.0–36.0)
MCV: 93 fL (ref 78.0–100.0)
Platelets: 431 10*3/uL — ABNORMAL HIGH (ref 150–400)
RBC: 3.99 MIL/uL (ref 3.87–5.11)
RDW: 14.5 % (ref 11.5–15.5)
WBC: 13.4 10*3/uL — AB (ref 4.0–10.5)

## 2016-01-25 SURGERY — ANTERIOR (CYSTOCELE) AND POSTERIOR REPAIR (RECTOCELE)
Anesthesia: Spinal

## 2016-01-25 MED ORDER — FLUTICASONE PROPIONATE 50 MCG/ACT NA SUSP: 1.0000 | Freq: Every day | NASAL | Status: DC | PRN

## 2016-01-25 MED ORDER — GENTAMICIN IN SALINE 1-0.9 MG/ML-% IV SOLN: 100.0000 mg | Freq: Once | INTRAVENOUS | Status: DC

## 2016-01-25 MED ORDER — BISACODYL 10 MG RE SUPP: 10.0000 mg | Freq: Every day | RECTAL | Status: DC | PRN

## 2016-01-25 MED ORDER — PROPOFOL 10 MG/ML IV BOLUS
INTRAVENOUS | Status: AC
  Filled 2016-01-25: qty 20

## 2016-01-25 MED ORDER — GENTAMICIN SULFATE 40 MG/ML IJ SOLN: 5.0000 mg/kg | INTRAMUSCULAR | Status: DC

## 2016-01-25 MED ORDER — KCL IN DEXTROSE-NACL 20-5-0.45 MEQ/L-%-% IV SOLN
INTRAVENOUS | Status: DC
  Administered 2016-01-26 (×3): via INTRAVENOUS
  Filled 2016-01-25 (×4): qty 1000

## 2016-01-25 MED ORDER — PANTOPRAZOLE SODIUM 40 MG PO TBEC
40.0000 mg | DELAYED_RELEASE_TABLET | Freq: Every day | ORAL | Status: DC
  Administered 2016-01-26: 40 mg via ORAL
  Filled 2016-01-25: qty 1

## 2016-01-25 MED ORDER — HYDROMORPHONE HCL 1 MG/ML IJ SOLN: 0.5000 mg | INTRAMUSCULAR | Status: DC | PRN

## 2016-01-25 MED ORDER — LACTATED RINGERS IV SOLN
INTRAVENOUS | Status: DC | PRN
  Administered 2016-01-25: 18:00:00 via INTRAVENOUS

## 2016-01-25 MED ORDER — SODIUM CHLORIDE 0.9% FLUSH
3.0000 mL | Freq: Two times a day (BID) | INTRAVENOUS | Status: DC
  Administered 2016-01-26: 3 mL via INTRAVENOUS

## 2016-01-25 MED ORDER — LORATADINE 10 MG PO TABS: 10.0000 mg | ORAL_TABLET | Freq: Every day | ORAL | Status: DC

## 2016-01-25 MED ORDER — FENTANYL CITRATE (PF) 100 MCG/2ML IJ SOLN
INTRAMUSCULAR | Status: AC
  Administered 2016-01-25: 25 ug via INTRAVENOUS
  Filled 2016-01-25: qty 2

## 2016-01-25 MED ORDER — BUPIVACAINE HCL (PF) 0.25 % IJ SOLN
INTRAMUSCULAR | Status: DC | PRN
  Administered 2016-01-25: 2 mL

## 2016-01-25 MED ORDER — SODIUM CHLORIDE 0.9 % IR SOLN
Status: DC | PRN
  Administered 2016-01-25: 1000 mL

## 2016-01-25 MED ORDER — FENTANYL CITRATE (PF) 100 MCG/2ML IJ SOLN
25.0000 ug | INTRAMUSCULAR | Status: DC | PRN
  Administered 2016-01-25 (×3): 25 ug via INTRAVENOUS

## 2016-01-25 MED ORDER — ONDANSETRON HCL 4 MG/2ML IJ SOLN: 4.0000 mg | INTRAMUSCULAR | Status: DC | PRN

## 2016-01-25 MED ORDER — GENTAMICIN IN SALINE 1-0.9 MG/ML-% IV SOLN
100.0000 mg | INTRAVENOUS | Status: AC
  Administered 2016-01-25: 100 mg via INTRAVENOUS
  Filled 2016-01-25 (×3): qty 100

## 2016-01-25 MED ORDER — MEPERIDINE HCL 25 MG/ML IJ SOLN
INTRAMUSCULAR | Status: DC | PRN
  Administered 2016-01-25 (×2): 12.5 mg via INTRAVENOUS

## 2016-01-25 MED ORDER — DEXTROSE 5 % IV SOLN
2.0000 g | INTRAVENOUS | Status: AC
  Administered 2016-01-25: 2 g via INTRAVENOUS

## 2016-01-25 MED ORDER — SODIUM CHLORIDE 0.9% FLUSH: 3.0000 mL | INTRAVENOUS | Status: DC | PRN

## 2016-01-25 MED ORDER — FENTANYL CITRATE (PF) 100 MCG/2ML IJ SOLN
INTRAMUSCULAR | Status: AC
  Filled 2016-01-25: qty 2

## 2016-01-25 MED ORDER — DEXTROSE 5 % IV SOLN
INTRAVENOUS | Status: AC
  Filled 2016-01-25: qty 2

## 2016-01-25 MED ORDER — LISINOPRIL 20 MG PO TABS: 20.0000 mg | ORAL_TABLET | Freq: Every day | ORAL | Status: DC

## 2016-01-25 MED ORDER — ACETAMINOPHEN 325 MG PO TABS: 650.0000 mg | ORAL_TABLET | ORAL | Status: DC | PRN

## 2016-01-25 MED ORDER — FENTANYL CITRATE (PF) 100 MCG/2ML IJ SOLN: 25.0000 ug | INTRAMUSCULAR | Status: DC | PRN

## 2016-01-25 MED ORDER — DEXTROSE 5 % IV SOLN
1.0000 g | INTRAVENOUS | Status: DC
  Administered 2016-01-26: 1 g via INTRAVENOUS
  Filled 2016-01-25: qty 10

## 2016-01-25 MED ORDER — LIDOCAINE HCL (CARDIAC) 20 MG/ML IV SOLN
INTRAVENOUS | Status: AC
  Filled 2016-01-25: qty 5

## 2016-01-25 MED ORDER — BUPIVACAINE HCL (PF) 0.25 % IJ SOLN
INTRAMUSCULAR | Status: AC
  Filled 2016-01-25: qty 30

## 2016-01-25 MED ORDER — MEPERIDINE HCL 50 MG/ML IJ SOLN
INTRAMUSCULAR | Status: AC
  Filled 2016-01-25: qty 1

## 2016-01-25 MED ORDER — ACETAMINOPHEN 325 MG PO TABS: 650.0000 mg | ORAL_TABLET | Freq: Four times a day (QID) | ORAL | Status: DC | PRN

## 2016-01-25 MED ORDER — PHENYLEPHRINE 40 MCG/ML (10ML) SYRINGE FOR IV PUSH (FOR BLOOD PRESSURE SUPPORT)
PREFILLED_SYRINGE | INTRAVENOUS | Status: AC
  Filled 2016-01-25: qty 10

## 2016-01-25 MED ORDER — OXYCODONE HCL 5 MG PO TABS: 5.0000 mg | ORAL_TABLET | ORAL | Status: DC | PRN

## 2016-01-25 MED ORDER — PROPOFOL 10 MG/ML IV BOLUS
INTRAVENOUS | Status: DC | PRN
  Administered 2016-01-25: 20 mg via INTRAVENOUS
  Administered 2016-01-25 (×3): 30 mg via INTRAVENOUS
  Administered 2016-01-25: 20 mg via INTRAVENOUS
  Administered 2016-01-25: 50 mg via INTRAVENOUS

## 2016-01-25 MED ORDER — ESCITALOPRAM OXALATE 10 MG PO TABS
10.0000 mg | ORAL_TABLET | Freq: Every day | ORAL | Status: DC
  Administered 2016-01-26 (×2): 10 mg via ORAL
  Filled 2016-01-25 (×2): qty 1

## 2016-01-25 MED ORDER — ONDANSETRON HCL 4 MG/2ML IJ SOLN
INTRAMUSCULAR | Status: DC | PRN
  Administered 2016-01-25: 4 mg via INTRAVENOUS

## 2016-01-25 MED ORDER — HYDROCODONE-ACETAMINOPHEN 5-325 MG PO TABS: 1.0000 | ORAL_TABLET | ORAL | Status: DC | PRN

## 2016-01-25 MED ORDER — HYDROCODONE-ACETAMINOPHEN 5-325 MG PO TABS: 1.0000 | ORAL_TABLET | Freq: Four times a day (QID) | ORAL | 0 refills | Status: DC | PRN

## 2016-01-25 MED ORDER — EPHEDRINE SULFATE 50 MG/ML IJ SOLN
INTRAMUSCULAR | Status: AC
  Filled 2016-01-25: qty 1

## 2016-01-25 MED ORDER — MEPERIDINE HCL 50 MG/ML IJ SOLN: 6.2500 mg | INTRAMUSCULAR | Status: DC | PRN

## 2016-01-25 MED ORDER — ONDANSETRON HCL 4 MG/2ML IJ SOLN
INTRAMUSCULAR | Status: AC
  Filled 2016-01-25: qty 2

## 2016-01-25 MED ORDER — CEPHALEXIN 500 MG PO CAPS: 500.0000 mg | ORAL_CAPSULE | Freq: Three times a day (TID) | ORAL | 0 refills | Status: DC

## 2016-01-25 MED ORDER — DIPHENHYDRAMINE HCL 25 MG PO TABS
50.0000 mg | ORAL_TABLET | Freq: Every day | ORAL | Status: DC
  Administered 2016-01-26: 50 mg via ORAL
  Filled 2016-01-25 (×2): qty 2

## 2016-01-25 MED ORDER — PHENYLEPHRINE 40 MCG/ML (10ML) SYRINGE FOR IV PUSH (FOR BLOOD PRESSURE SUPPORT)
PREFILLED_SYRINGE | INTRAVENOUS | Status: DC | PRN
  Administered 2016-01-25 (×4): 80 ug via INTRAVENOUS

## 2016-01-25 MED ORDER — FLEET ENEMA 7-19 GM/118ML RE ENEM: 1.0000 | ENEMA | Freq: Once | RECTAL | Status: DC | PRN

## 2016-01-25 MED ORDER — BUPIVACAINE IN DEXTROSE 0.75-8.25 % IT SOLN
INTRATHECAL | Status: DC | PRN
Start: 2016-01-25 — End: 2016-01-25
  Administered 2016-01-25: 1.2 mL via INTRATHECAL

## 2016-01-25 MED ORDER — SODIUM CHLORIDE 0.9 % IV SOLN: 250.0000 mL | INTRAVENOUS | Status: DC | PRN

## 2016-01-25 MED ORDER — SODIUM CHLORIDE 0.9 % IJ SOLN
INTRAMUSCULAR | Status: AC
  Filled 2016-01-25: qty 10

## 2016-01-25 MED ORDER — SENNOSIDES-DOCUSATE SODIUM 8.6-50 MG PO TABS: 1.0000 | ORAL_TABLET | Freq: Every evening | ORAL | Status: DC | PRN

## 2016-01-25 MED ORDER — FENTANYL CITRATE (PF) 100 MCG/2ML IJ SOLN
INTRAMUSCULAR | Status: DC | PRN
  Administered 2016-01-25: 50 ug via INTRAVENOUS

## 2016-01-25 MED ORDER — EPHEDRINE SULFATE 50 MG/ML IJ SOLN
INTRAMUSCULAR | Status: DC | PRN
  Administered 2016-01-25 (×3): 10 mg via INTRAVENOUS

## 2016-01-25 MED ORDER — ACETAMINOPHEN 650 MG RE SUPP
650.0000 mg | RECTAL | Status: DC | PRN
  Filled 2016-01-25: qty 1

## 2016-01-25 SURGICAL SUPPLY — 52 items
BAG URINE DRAINAGE (UROLOGICAL SUPPLIES) IMPLANT
BLADE SURG 15 STRL LF DISP TIS (BLADE) ×1 IMPLANT
BLADE SURG 15 STRL SS (BLADE) ×1
CATH FOLEY 2WAY SLVR  5CC 14FR (CATHETERS)
CATH FOLEY 2WAY SLVR  5CC 16FR (CATHETERS) ×1
CATH FOLEY 2WAY SLVR 5CC 14FR (CATHETERS) IMPLANT
CATH FOLEY 2WAY SLVR 5CC 16FR (CATHETERS) ×1 IMPLANT
COVER MAYO STAND STRL (DRAPES) IMPLANT
COVER SURGICAL LIGHT HANDLE (MISCELLANEOUS) IMPLANT
DEVICE CAPIO SLIM SINGLE (INSTRUMENTS) IMPLANT
ELECT PENCIL ROCKER SW 15FT (MISCELLANEOUS) IMPLANT
GAUZE SPONGE 4X4 16PLY XRAY LF (GAUZE/BANDAGES/DRESSINGS) IMPLANT
GLOVE BIOGEL M STRL SZ7.5 (GLOVE) ×6 IMPLANT
GOWN STRL REUS W/TWL LRG LVL3 (GOWN DISPOSABLE) ×4 IMPLANT
HOLDER FOLEY CATH W/STRAP (MISCELLANEOUS) IMPLANT
IV NS 1000ML (IV SOLUTION)
IV NS 1000ML BAXH (IV SOLUTION) IMPLANT
KIT BASIN OR (CUSTOM PROCEDURE TRAY) IMPLANT
NEEDLE HYPO 22GX1.5 SAFETY (NEEDLE) IMPLANT
NEEDLE MAYO 6 CRC TAPER PT (NEEDLE) IMPLANT
NEEDLE SPNL 22GX3.5 QUINCKE BK (NEEDLE) IMPLANT
NEEDLE SPNL 22GX7 QUINCKE BK (NEEDLE) IMPLANT
NS IRRIG 1000ML POUR BTL (IV SOLUTION) ×2 IMPLANT
PACK CYSTO (CUSTOM PROCEDURE TRAY) ×2 IMPLANT
PACKING VAGINAL (PACKING) IMPLANT
PLUG CATH AND CAP STER (CATHETERS) IMPLANT
RETRACTOR LONRSTAR 16.6X16.6CM (MISCELLANEOUS) IMPLANT
RETRACTOR STAY HOOK 5MM (MISCELLANEOUS) IMPLANT
RETRACTOR STER APS 16.6X16.6CM (MISCELLANEOUS)
SHEET LAVH (DRAPES) IMPLANT
SPONGE LAP 4X18 X RAY DECT (DISPOSABLE) IMPLANT
SUCTION FRAZIER HANDLE 12FR (TUBING)
SUCTION TUBE FRAZIER 12FR DISP (TUBING) IMPLANT
SUT CAPIO ETHIBPND (SUTURE) IMPLANT
SUT CHROMIC 3 0 SH 27 (SUTURE) ×2 IMPLANT
SUT VIC AB 0 CT1 27 (SUTURE)
SUT VIC AB 0 CT1 27XBRD ANTBC (SUTURE) IMPLANT
SUT VIC AB 2-0 CT1 27 (SUTURE)
SUT VIC AB 2-0 CT1 TAPERPNT 27 (SUTURE) IMPLANT
SUT VIC AB 2-0 SH 27 (SUTURE)
SUT VIC AB 2-0 SH 27X BRD (SUTURE) IMPLANT
SUT VIC AB 2-0 UR5 27 (SUTURE) IMPLANT
SUT VIC AB 2-0 UR6 27 (SUTURE) IMPLANT
SUT VIC AB 3-0 PS2 18 (SUTURE)
SUT VIC AB 3-0 PS2 18XBRD (SUTURE) IMPLANT
SUT VIC AB 3-0 SH 27 (SUTURE)
SUT VIC AB 3-0 SH 27X BRD (SUTURE) IMPLANT
SUT VICRYL 0 UR6 27IN ABS (SUTURE) IMPLANT
SYRINGE 10CC LL (SYRINGE) IMPLANT
TOWEL OR NON WOVEN STRL DISP B (DISPOSABLE) IMPLANT
TUBING CONNECTING 10 (TUBING) ×2 IMPLANT
YANKAUER SUCT BULB TIP 10FT TU (MISCELLANEOUS) ×2 IMPLANT

## 2016-01-25 NOTE — Anesthesia Postprocedure Evaluation (Signed)
Anesthesia Post Note  Patient: Caitlin [name]  Procedure(s) Performed: Procedure(s) (LRB): Excision of suburethral mass (N/A)  Patient location during evaluation: PACU Anesthesia Type: Spinal Level of consciousness: oriented and awake and alert Pain management: pain level controlled Vital Signs Assessment: post-procedure vital signs reviewed and stable Respiratory status: spontaneous breathing, respiratory function stable and patient connected to nasal cannula oxygen Cardiovascular status: blood pressure returned to baseline and stable Postop Assessment: no headache, no backache, patient able to bend at knees and no signs of nausea or vomiting Anesthetic complications: no    Last Vitals:  Vitals:   01/25/16 2029 01/25/16 2030  BP: (!) 122/58 (!) 122/58  Pulse: 85 83  Resp: 18 17  Temp: 36.8 C     Last Pain:  Vitals:   01/25/16 1601  TempSrc: Oral                 Johnnie Goynes A

## 2016-01-25 NOTE — Anesthesia Preprocedure Evaluation (Addendum)
Anesthesia Evaluation  Patient identified by MRN, date of birth, ID band Patient awake    Reviewed: Allergy & Precautions, H&P , Patient's Chart, lab work & pertinent test results  Airway Mallampati: II  TM Distance: >3 FB Neck ROM: full    Dental no notable dental hx.    Pulmonary    Pulmonary exam normal breath sounds clear to auscultation       Cardiovascular Exercise Tolerance: Good hypertension,  Rhythm:regular Rate:Normal     Neuro/Psych    GI/Hepatic   Endo/Other    Renal/GU      Musculoskeletal   Abdominal   Peds  Hematology   Anesthesia Other Findings   Reproductive/Obstetrics                             Anesthesia Physical Anesthesia Plan  ASA: II  Anesthesia Plan: Spinal   Post-op Pain Management:    Induction:   Airway Management Planned:   Additional Equipment:   Intra-op Plan:   Post-operative Plan:   Informed Consent: I have reviewed the patients History and Physical, chart, labs and discussed the procedure including the risks, benefits and alternatives for the proposed anesthesia with the patient or authorized representative who has indicated his/her understanding and acceptance.   Dental Advisory Given  Plan Discussed with: CRNA  Anesthesia Plan Comments: (Lab work confirmed with CRNA in room. Platelets okay. Discussed spinal anesthetic, and patient consents to the procedure:  included risk of possible headache,backache, failed block, allergic reaction, and nerve injury. This patient was asked if she had any questions or concerns before the procedure started. )        Anesthesia Quick Evaluation

## 2016-01-25 NOTE — Anesthesia Procedure Notes (Signed)
Spinal  Start time: 01/25/2016 6:33 PM End time: 01/25/2016 6:38 PM Staffing Anesthesiologist: CREWS, DAVID Resident/CRNA: Lajuana Carry E Performed: resident/CRNA  Preanesthetic Checklist Completed: patient identified, site marked, surgical consent, pre-op evaluation, timeout performed, IV checked, risks and benefits discussed and monitors and equipment checked Spinal Block Patient position: sitting Prep: Betadine Patient monitoring: heart rate, continuous pulse ox and blood pressure Approach: midline Location: L3-4 Injection technique: single-shot Needle Needle type: Sprotte  Needle gauge: 24 G Needle length: 9 cm Assessment Sensory level: T6 Additional Notes Time out, kit checked, sitting position. Clear CSF, neg heme, transient right paresthesia, tol well, return to supine.

## 2016-01-25 NOTE — Discharge Instructions (Signed)
CYSTOSCOPY HOME CARE INSTRUCTIONS  Activity: Rest for the remainder of the day.  Do not drive or operate equipment today.  You may resume normal activities in one to two days as instructed by your physician.   Meals: Drink plenty of liquids and eat light foods such as gelatin or soup this evening.  You may return to a normal meal plan tomorrow.  Return to Work: You may return to work in one to two days or as instructed by your physician.  Special Instructions / Symptoms: Call your physician if any of these symptoms occur:   -persistent or heavy bleeding  -bleeding which continues after first few urination  -large blood clots that are difficult to pass  -urine stream diminishes or stops completely  -fever equal to or higher than 101 degrees Farenheit.  -cloudy urine with a strong, foul odor  -severe pain  Females should always wipe from front to back after elimination.  You may feel some burning pain when you urinate.  This should disappear with time.  Applying moist heat to the lower abdomen or a hot tub bath may help relieve the pain. \  I would like to leave the foley for 1 week to allow healing prior to resumption of self cath.  If you don't  Have an appointment for f/u, please call the office for an appointment for 1 week for catheter removal.   Patient Signature:  ________________________________________________________  Nurse's Signature:  ________________________________________________________

## 2016-01-25 NOTE — H&P (Signed)
/HPI: Caitlin [name] returns for difficulty with CIC.   She has a neobladder and is on CIC but has had increasing difficulty over the last few months but has not been able to cath since yesterday morning. She has had bleeding. She has been leaking since her last cath. She has had chills and possible fever. She is hypotensive today but has no tachycardia or fever on VS today. She has had a decreased appetite and some nausea.   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 She first presented with gross hematuria in August of 2009. CT revealed a 4 cm mass on her left bladder wall. TUR-BT was performed by Dr. Jeffie Pollock on 01/31/2008, pathology revealed sarcomatoid urothelial carcinoma. Repeat resection on 02/21/2008 revealed only fibrosis without neoplasia. Because of her interest in continent diversion, she was referred to me.She underwent radical cystectomy, bilateral pelvic lymphadenectomy and ileal neobladder November 2009. Final pathology revealed no residual carcinoma, and no nodal disease. She initially had a distended neobladder and hydronephrosis on followup CT. she was then put on self intermittent catheterization which she has been doing on a regular basis, at least every 4 hours without difficulty.     ALLERGIES: SMZ-TMP DS TABS    MEDICATIONS: Alendronate Sodium 35 mg tablet  Aspir 81  Escitalopram Oxalate 10 mg tablet  Lisinopril 20 MG Oral Tablet Oral  Omeprazole 20 MG Oral Capsule Delayed Release Oral  Vitamin B-12 SOLN 0 Injection     GU PSH: Cystoscopy TURBT >5 cm - 17-Sep-2007 Cystoscopy TURBT 2-5 cm - 17-Sep-2007 Hysterectomy Unilat SO - 17-Sep-2007 Robotic Cystect Neobladder 09/17/2007      PSH Notes: Complete Colonoscopy, Complete Bladder Cystectomy With Continent Diversion, Cystoscopy With Fulguration Medium Lesion (2-5cm), Cystoscopy With Fulguration Large Lesion (Over 5cm), Breast Surgery Lumpectomy, Hysterectomy, Knee Replacement   NON-GU PSH: Diagnostic Colonoscopy -  09-16-12 Remove Breast Lesion - 2007/09/17 Revise Knee Joint - 09-17-2007    GU PMH: Gross hematuria, Gross hematuria - 09/29/2015 Urinary Tract Inf, Unspec site, Suspected urinary tract infection - 09/29/2015, Pyuria, - 04/14/2015, Urinary tract infection, - 16-Sep-2012 Personal history of malignant neoplasm of bladder, History of malignant neoplasm of bladder - 04/14/2015 Urinary Retention, Unspec, Incomplete bladder emptying - 04/07/2014 Acute Cystitis, Acute Cystitis - 09/16/2012 Acute vaginitis, Vaginitis - 2012-09-16 Postmenopausal atrophic vaginitis, Atrophic vaginitis - Sep 16, 2012 Rectocele, Rectocele - 09/16/12 Urge incontinence, Urge Incontinence Of Urine - Sep 16, 2012      PMH Notes:  2008-01-22 08:32:37 - Note: Breast Cancer   NON-GU PMH: Encounter for general adult medical examination without abnormal findings, Encounter for preventive health examination - 09/29/2015 Other specified postprocedural states, History of urinary diversion procedure - 04/14/2015 Pneumonia, unspecified organism, Acute pneumonitis - 04/07/2014 Personal history of other diseases of the circulatory system, History of hypertension - 2012-09-16 Personal history of other endocrine, nutritional and metabolic disease, History of hypercholesterolemia - 09-16-12    FAMILY HISTORY: Death In The Family Mother - Mother Family Health Status Number - Runs In Family Stroke Syndrome - Mother   SOCIAL HISTORY: Marital Status: Married Current Smoking Status: Patient has never smoked.  Has never drank.  Drinks 2 caffeinated drinks per day.     Notes: Never A Smoker, Caffeine Use, Marital History - Currently Married   REVIEW OF SYSTEMS:    GU Review Female:   Patient reports get up at night to urinate, leakage of urine, and stream starts and stops. Patient denies frequent urination, hard to postpone urination, burning /pain with urination,  trouble starting your stream, have to strain to urinate, and currently pregnant.  Gastrointestinal (Upper):   Patient reports nausea.  Patient denies vomiting and indigestion/ heartburn.  Gastrointestinal (Lower):   Patient reports diarrhea. Patient denies constipation.  Constitutional:   Patient reports night sweats and fatigue. Patient denies fever and weight loss.  Skin:   Patient denies skin rash/ lesion and itching.  Eyes:   Patient denies blurred vision and double vision.  Ears/ Nose/ Throat:   Patient denies sore throat and sinus problems.  Hematologic/Lymphatic:   Patient denies swollen glands and easy bruising.  Cardiovascular:   Patient denies leg swelling and chest pains.  Respiratory:   Patient reports shortness of breath. Patient denies cough.  Endocrine:   Patient denies excessive thirst.  Musculoskeletal:   Patient denies back pain and joint pain.  Neurological:   Patient denies headaches and dizziness.  Psychologic:   Patient denies depression and anxiety.   VITAL SIGNS:      01/25/2016 12:01 PM 01/25/2016 10:42 AM  Weight   170 lb / 77.11 kg  Height   62 in / 157.48 cm  BP 110/71 mmHg 79/52 mmHg  Pulse 78 /min 87 /min  Temperature   97.4 F / 36 C  BMI 31.1 kg/m   GU PHYSICAL EXAMINATION:    External Genitalia: No hirsuitism, no rash, no scarring, no cyst, no erythematous lesion, no papular lesion, no blanched lesion, no warty lesion, no labial adhesions, no atrophic introitus. No edema.   Urethral Meatus: Normal size. Normal position. There is a lobulated suburethral mass about 2cm in size that is necrotic and bled briskly with manipulation.   Urethra: No tenderness, no mass, no scarring. No urethral hypermobility. No leakage.   Bladder: No masses were palpable.  Vagina: Moderate vaginal atrophy. No stenosis. No rectocele. No cystocele. No enterocele.   Cervix: S/P Hysterectomy.   Uterus: S/P Hysterectomy.   Anus and Perineum: No hemorrhoids. No anal stenosis. No rectal fissure, no anal fissure. No edema, no dimple, no perineal tenderness, no anal tenderness.    MULTI-SYSTEM PHYSICAL EXAMINATION:     Constitutional: Well-nourished. No physical deformities. Normally developed. Good grooming.  Respiratory: No labored breathing, no use of accessory muscles. CTA  Cardiovascular: Normal temperature, no swelling, no varicosities. RRR without murmur.  Lymphatic: No enlargement of neck, axillae, groin.  Skin: No paleness, no jaundice, no cyanosis. No lesion, no ulcer, no rash.  Neurologic / Psychiatric: Oriented to time, oriented to place, oriented to person. No depression, no anxiety, no agitation.  Gastrointestinal: No mass, but full in the lower abdomen with mild suprapubic tenderness, no rigidity, non obese abdomen.   Musculoskeletal: Normal gait and station of head and neck.     PAST DATA REVIEWED:  Source Of History:  Patient  Records Review:   Previous Doctor Records  Urine Test Review:   Urinalysis, Urine Culture and Sensitivity  Notes:                     She had a pan sensitive klebsiella in May. I reviewed Dr. Erma Heritage prior note and he noted a small caruncle at that time.   PROCEDURES:          Catheter / SP Tube - [id number] Simple Foley Catheterization  A 16 French Foley catheter was inserted into the bladder using sterile technique. The patient was taught routine catheter care. A leg bag was connected.   In and Out Cath for Specimen/ Medicare  A __14____  French straight catheter was inserted into the bladder using sterile technique. 150 cc of urine was obtained.         Urinalysis w/Scope - 81001 Dipstick Dipstick Cont'd Micro  Specimen: Patient Cath Bilirubin: Neg WBC/hpf: 0-5/hpf  Color: Yellow Ketones: Neg RBC/hpf: 0-2/hpf  Appearance: Clear Blood: 1+ Bacteria: Many (>50/hpf)  Specific Gravity: 1.010 Protein: Neg Cystals: NS (Not Seen)  pH: 6.0 Urobilinogen: 0.2 Casts: NS (Not Seen)  Glucose: Neg Nitrites: Neg Trichomonas: Not Present    Leukocyte Esterase: Neg Mucous: Not Present      Epithelial Cells: 0-5/hpf      Yeast: NS (Not Seen)      Sperm: Not Present          Ceftriaxone 1g - [id number], [id number] Qty: 1 Adm. By: Robynn Pane  Unit: gram Lot No [id number]  Route: IM Exp. 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   2   Acute Cystitis - N30.00 Her urine looks infected and she has had some chills.   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   PLAN:           Orders Labs Urine Culture and Sensitivity          Schedule Return Visit: ASAP - Schedule Surgery  Procedure: 01/25/2016 - Excision of Urethral Caruncle - 53265          Document Letter(s):  Created for Patient: Clinical Summary         Notes:   Rocephin given today.   A foley catheter was placed to leg bag drainage.   I am going to take her to the OR tonight to excise the suburethral mass because of the bleeding. I have reviewed the risks of bleeding, infection, stricture, thrombotic events and anesthetic complications.

## 2016-01-25 NOTE — Brief Op Note (Signed)
01/25/2016  7:22 PM  PATIENT:  Awilda Metro  75 y.o. female  PRE-OPERATIVE DIAGNOSIS:  suburethral mass  POST-OPERATIVE DIAGNOSIS:  suburethral mass  PROCEDURE:  Procedure(s): Excision of suburethral mass (N/A)  SURGEON:  Surgeon(s) and Role:    * Irine Seal, MD - Primary  PHYSICIAN ASSISTANT:   ASSISTANTS: none   ANESTHESIA:   spinal  EBL:  No intake/output data recorded.  BLOOD ADMINISTERED:none  DRAINS: Urinary Catheter (Foley)   LOCAL MEDICATIONS USED:  MARCAINE    and Amount: 2 ml 0.25%   SPECIMEN:  Source of Specimen:  Suburethral mass  DISPOSITION OF SPECIMEN:  PATHOLOGY  COUNTS:  YES  TOURNIQUET:  * No tourniquets in log *  DICTATION: .Other Dictation: Dictation Number [id number]  PLAN OF CARE: Discharge to home after PACU  PATIENT DISPOSITION:  PACU - hemodynamically stable.   Delay start of Pharmacological VTE agent (>24hrs) due to surgical blood loss or risk of bleeding: not applicable

## 2016-01-25 NOTE — Transfer of Care (Signed)
Immediate Anesthesia Transfer of Care Note  Patient: Caitlin [name]  Procedure(s) Performed: Procedure(s): Excision of suburethral mass (N/A)  Patient Location: PACU  Anesthesia Type:Spinal  Level of Consciousness:  sedated, patient cooperative and responds to stimulation  Airway & Oxygen Therapy:Patient Spontanous Breathing and Patient connected to face mask oxgen  Post-op Assessment:  Report given to PACU RN and Post -op Vital signs reviewed and stable  Post vital signs:  Reviewed and stable  Last Vitals:  Vitals:   01/25/16 1601  BP: (!) 81/49  Pulse: 89  Resp: 16  Temp: [id number] C    Complications: No apparent anesthesia complications

## 2016-01-26 ENCOUNTER — Encounter (HOSPITAL_COMMUNITY): Payer: Self-pay | Admitting: Urology

## 2016-01-26 DIAGNOSIS — C680 Malignant neoplasm of urethra: Secondary | ICD-10-CM | POA: Diagnosis not present

## 2016-01-26 DIAGNOSIS — N289 Disorder of kidney and ureter, unspecified: Secondary | ICD-10-CM

## 2016-01-26 DIAGNOSIS — N362 Urethral caruncle: Secondary | ICD-10-CM | POA: Diagnosis present

## 2016-01-26 LAB — BASIC METABOLIC PANEL
Anion gap: 5 (ref 5–15)
BUN: 52 mg/dL — AB (ref 6–20)
CO2: 18 mmol/L — ABNORMAL LOW (ref 22–32)
CREATININE: 2.32 mg/dL — AB (ref 0.44–1.00)
Calcium: 8.2 mg/dL — ABNORMAL LOW (ref 8.9–10.3)
Chloride: 110 mmol/L (ref 101–111)
GFR calc Af Amer: 22 mL/min — ABNORMAL LOW (ref >60)
GFR, EST NON AFRICAN AMERICAN: 19 mL/min — AB (ref >60)
Glucose, Bld: 109 mg/dL — ABNORMAL HIGH (ref 65–99)
Potassium: 4.7 mmol/L (ref 3.5–5.1)
SODIUM: 133 mmol/L — AB (ref 135–145)

## 2016-01-26 LAB — CBC
HCT: 28.4 % — ABNORMAL LOW (ref 36.0–46.0)
Hemoglobin: 9.1 g/dL — ABNORMAL LOW (ref 12.0–15.0)
MCH: 29.9 pg (ref 26.0–34.0)
MCHC: 32 g/dL (ref 30.0–36.0)
MCV: 93.4 fL (ref 78.0–100.0)
PLATELETS: 331 10*3/uL (ref 150–400)
RBC: 3.04 MIL/uL — ABNORMAL LOW (ref 3.87–5.11)
RDW: 14.6 % (ref 11.5–15.5)
WBC: 10 10*3/uL (ref 4.0–10.5)

## 2016-01-26 MED ORDER — DIPHENHYDRAMINE HCL 50 MG PO CAPS
50.0000 mg | ORAL_CAPSULE | Freq: Every day | ORAL | Status: DC
  Administered 2016-01-26: 50 mg via ORAL
  Filled 2016-01-26: qty 1

## 2016-01-26 MED ORDER — BELLADONNA ALKALOIDS-OPIUM 16.2-60 MG RE SUPP
1.0000 | Freq: Four times a day (QID) | RECTAL | Status: DC | PRN
  Administered 2016-01-26 (×2): 1 via RECTAL
  Filled 2016-01-26 (×2): qty 1

## 2016-01-26 MED ORDER — SODIUM CHLORIDE 0.9 % IV BOLUS (SEPSIS)
500.0000 mL | Freq: Once | INTRAVENOUS | Status: AC
  Administered 2016-01-26: 500 mL via INTRAVENOUS

## 2016-01-26 NOTE — Progress Notes (Addendum)
Patient ID: Caitlin [name], female   DOB: July 29, 1940, 75 y.o.   MRN: [id number] 1 Day Post-Op  Subjective: Yasmeena feels fine but she has been hypotensive with low urine output.   The urine is clear in the tube, but she was just irrigated.   A bladder scan didn't show an elevated PVR.   She had excision of a suburethral mass last night.  Her UA in the office had bacteriuria.   She had a mild leukocytosis but no fever.  ROS:  Review of Systems  Constitutional: Negative for chills and fever.  Gastrointestinal: Negative for abdominal pain.  Genitourinary: Positive for hematuria.  All other systems reviewed and are negative.   Anti-infectives: Anti-infectives    Start     Dose/Rate Route Frequency Ordered Stop   01/26/16 2000  cefTRIAXone (ROCEPHIN) 1 g in dextrose 5 % 50 mL IVPB     1 g 100 mL/hr over 30 Minutes Intravenous Every 24 hours 01/25/16 2358     01/25/16 1630  gentamicin (GARAMYCIN) IVPB 100 mg     100 mg 200 mL/hr over 30 Minutes Intravenous 30 min pre-op 01/25/16 1559 01/25/16 1910   01/25/16 1615  gentamicin (GARAMYCIN) IVPB 100 mg  Status:  Discontinued     100 mg 200 mL/hr over 30 Minutes Intravenous  Once 01/25/16 1615 01/25/16 1616   01/25/16 1600  cefTRIAXone (ROCEPHIN) 2 g in dextrose 5 % 50 mL IVPB     2 g 100 mL/hr over 30 Minutes Intravenous 30 min pre-op 01/25/16 1543 01/25/16 1900   01/25/16 1543  gentamicin (GARAMYCIN) 5 mg/kg in dextrose 5 % 100 mL IVPB  Status:  Discontinued     5 mg/kg 100 mL/hr over 60 Minutes Intravenous 30 min pre-op 01/25/16 1543 01/25/16 1558   01/25/16 0000  cephALEXin (KEFLEX) 500 MG capsule     500 mg Oral 3 times daily 01/25/16 1929        Current Facility-Administered Medications  Medication Dose Route Frequency Provider Last Rate Last Dose  . 0.9 %  sodium chloride infusion  250 mL Intravenous PRN Irine Seal, MD      . acetaminophen (TYLENOL) tablet 650 mg  650 mg Oral Q4H PRN Irine Seal, MD       Or  . acetaminophen (TYLENOL)  suppository 650 mg  650 mg Rectal Q4H PRN Irine Seal, MD      . acetaminophen (TYLENOL) tablet 650 mg  650 mg Oral Q6H PRN Irine Seal, MD      . bisacodyl (DULCOLAX) suppository 10 mg  10 mg Rectal Daily PRN Irine Seal, MD      . cefTRIAXone (ROCEPHIN) 1 g in dextrose 5 % 50 mL IVPB  1 g Intravenous Q24H Irine Seal, MD      . dextrose 5 % and 0.45 % NaCl with KCl 20 mEq/L infusion   Intravenous Continuous Irine Seal, MD 100 mL/hr at 01/26/16 0032    . diphenhydrAMINE (BENADRYL) tablet 50 mg  50 mg Oral QHS Irine Seal, MD   50 mg at 01/26/16 0032  . escitalopram (LEXAPRO) tablet 10 mg  10 mg Oral QHS Irine Seal, MD   10 mg at 01/26/16 0032  . fentaNYL (SUBLIMAZE) injection 25-50 mcg  25-50 mcg Intravenous Q2H PRN Irine Seal, MD      . fluticasone [facility]) 50 MCG/ACT nasal spray 1 spray  1 spray Each Nare Daily PRN Irine Seal, MD      . HYDROcodone-acetaminophen (NORCO/VICODIN) 5-325 MG per tablet  1-2 tablet  1-2 tablet Oral Q4H PRN Irine Seal, MD      . HYDROmorphone (DILAUDID) injection 0.5-1 mg  0.5-1 mg Intravenous Q2H PRN Irine Seal, MD      . lisinopril (PRINIVIL,ZESTRIL) tablet 20 mg  20 mg Oral Daily Irine Seal, MD      . loratadine (CLARITIN) tablet 10 mg  10 mg Oral Daily Irine Seal, MD      . ondansetron [facility]) injection 4 mg  4 mg Intravenous Q4H PRN Irine Seal, MD      . oxyCODONE (Oxy IR/ROXICODONE) immediate release tablet 5-10 mg  5-10 mg Oral Q4H PRN Irine Seal, MD      . pantoprazole (PROTONIX) EC tablet 40 mg  40 mg Oral Daily Irine Seal, MD      . senna-docusate (Senokot-S) tablet 1 tablet  1 tablet Oral QHS PRN Irine Seal, MD      . sodium chloride 0.9 % bolus 500 mL  500 mL Intravenous Once Irine Seal, MD      . sodium chloride flush (NS) 0.9 % injection 3 mL  3 mL Intravenous Q12H Irine Seal, MD      . sodium chloride flush (NS) 0.9 % injection 3 mL  3 mL Intravenous PRN Irine Seal, MD      . sodium phosphate (FLEET) 7-19 GM/118ML enema 1 enema  1 enema Rectal Once PRN  Irine Seal, MD         Objective: Vital signs in last 24 hours: Temp:  [97.5 F (36.4 C)-98.6 F (37 C)] 97.6 F (36.4 C) (08/29 0500) Pulse Rate:  [70-89] 70 (08/29 0500) Resp:  [16-20] 18 (08/29 0500) BP: (78-122)/(36-58) 89/36 (08/29 0520) SpO2:  [96 %-100 %] 100 % (08/29 0500) Weight:  [76.4 kg (168 lb 6.4 oz)] 76.4 kg (168 lb 6.4 oz) (08/28 1601)  Intake/Output from previous day: 08/28 0701 - 08/29 0700 In: 2047 [P.O.:222; I.V.:1745; IV Piggyback:50] Out: 330 [Urine:280; Blood:50] Intake/Output this shift: No intake/output data recorded.   Physical Exam  Constitutional: She is oriented to person, place, and time and well-developed, well-nourished, and in no distress.  Cardiovascular: Normal rate, regular rhythm and normal heart sounds.   Pulmonary/Chest: Effort normal and breath sounds normal. No respiratory distress.  Abdominal: Soft. She exhibits no mass. There is no tenderness. There is no guarding.  Musculoskeletal: Normal range of motion. She exhibits no edema or tenderness.  Neurological: She is alert and oriented to person, place, and time.  Skin: Skin is warm and dry.  Psychiatric: Mood and affect normal.    Lab Results:   Recent Labs  01/25/16 1600  WBC 13.4*  HGB 11.7*  HCT 37.1  PLT 431*   BMET No results for input(s): NA, K, CL, CO2, GLUCOSE, BUN, CREATININE, CALCIUM in the last 72 hours. PT/INR No results for input(s): LABPROT, INR in the last 72 hours. ABG No results for input(s): PHART, HCO3 in the last 72 hours.  Invalid input(s): PCO2, PO2  Studies/Results: No results found.  Labs reviewed.   Assessment and Plan: 1. POD #1 from excision of suburethral mass. 2. UTI 3. History for sarcomatoid bladder cancer 8 years post cystectomy and neobladder.  Jesiah has been hypotensive with a low urine output but is otherwise doing well clinically.  I am going to check labs and give her a NS 534ml bolus and will reassess mid day.     Her  catheter was found to be out and after replacement it didn't drain.  The foley was aspirated with return of a small amount of mucous and 551ml of otherwise clear urine.     Her Cr is 2.32 and she is acidotic but I don't have a recent baseline.  Her WBC has declined.   I am going to keep her overnight and recheck labs in the morning.  I will check our office records for a prior Cr. Level.    LOS: 0 days    Malka So 01/26/2016 [id number]

## 2016-01-26 NOTE — Progress Notes (Signed)
Pt. Had a BP of 90/36 taken manually and HR of 71. Pt. C/o of no dizziness or lightheadedness. Urology paged and made aware, no new orders just to continue to monitor pt. Closely.

## 2016-01-26 NOTE — Op Note (Signed)
[name], SMILING NO.:  [id number]  MEDICAL RECORD NO.:  [id number]  LOCATION:  51                         FACILITY:  [facility]  PHYSICIAN:  Marshall Cork. Jeffie Pollock, M.D.    DATE OF BIRTH:  07-17-1940  DATE OF PROCEDURE:  01/25/2016 DATE OF DISCHARGE:                              OPERATIVE REPORT   PROCEDURE:  Excision of suburethral mass.  PREOPERATIVE DIAGNOSIS:  Suburethral mass.  POSTOPERATIVE DIAGNOSIS:  Suburethral mass.  SURGEON:  Marshall Cork. Jeffie Pollock, M.D.  ANESTHESIA:  General.  BLOOD LOSS:  Minimal.  DRAINS:  A 16-French Foley catheter.  SPECIMENS:  Suburethral mass.  COMPLICATIONS:  None.  INDICATIONS:  [name] is a 75 year old white female with a history of sarcomatoid urothelial carcinoma, who was approximately 8 years out from a radical cystectomy with neobladder creation by Dr. Diona Fanti, she presented today in the office with difficulty with self cath and on examination, there was found to have approximately 2-3 cm suburethral mass, that was lobulated, necrotic with some induration and it was felt to represent either a necrotic large urethral caruncle or possibly a neoplasm.  She had been noted on an exam in May to have a small urethral caruncle.  She did not have circumferential prolapse.  On examination in the office, the lesion was quite friable and bled briskly and it was felt that excision was indicated on an urgent basis.  FINDINGS AND PROCEDURE:  She was given Rocephin in the office because of many bacteria in her urine.  She was taken to the operating room where she was also given gentamicin to broaden coverage since cultures were pending.  A spinal anesthetic was induced.  She was placed in lithotomy position and was fitted with PAS hose.  Her Foley catheter was removed and she was prepped with Betadine solution and draped in usual sterile fashion.  Cystoscopy was performed using a 23-French scope and 30-degree lens. Examination  revealed the mass at the inferior aspect of the meatus. There was no further propagation into the urethra.  Inspection of the bladder revealed bland-appearing intestinal mucosa with some mucus, but no lesions.  The ureteral orifices were not identified.  After cystoscopy, a 16-French Foley catheter was inserted and the balloon was filled with 10 mL of sterile fluid.  A weighted vaginal retractor was placed and the mass was excised using Metzenbaum, it was quite friable and tore part during the procedure, but I was able to excise the entire mass.  Once the mass had been excised, the Bovie was used to cauterize the bed of the lesion and the vaginal mucosa was then closed in the midline using running lock 3-0 chromic suture.  Once the closure was complete and hemostasis was achieved, the incision line was infiltrated with 2 mL of 0.25% Marcaine without epinephrine.  The Foley catheter was left in place.  There was minor narrowing of the urethral meatus, but not significant stenosis.  At this point, the catheter was placed to straight drainage.  The patient was taken down from lithotomy position.  Her anesthetic was reversed.  She was moved to the recovery room in stable condition.  The  specimen was sent to Pathology for evaluation.  There were no complications.     Marshall Cork. Jeffie Pollock, M.D.     JJW/MEDQ  D:  01/25/2016  T:  01/26/2016  Job:  [id number]

## 2016-01-26 NOTE — Progress Notes (Signed)
States she is having pressure.  Pt distended, bladder scan 480cc. Pt foley fell out. MD notified. Order received to replace foley. 24 F foley inserted, slow return. Will cont to monitor. SRP, RN

## 2016-01-26 NOTE — Progress Notes (Signed)
Pt. Mentioned discomfort in mid pelvic area. Urine draining in foley bag, bladder scanned pt. Just in case. There was 0 mL in bladder per bladder scan. Offered pain medicine to relieve discomfort, pt. Refused. Will continue to monitor urine output closely. Diet advanced to regular diet, pt. Not complaining of N/V

## 2016-01-26 NOTE — Progress Notes (Signed)
Foley irrigated with 120 NS tol well, 500 cc or urine return with mucous noted. Pt tol procedure well. SRP, RN

## 2016-01-27 DIAGNOSIS — D62 Acute posthemorrhagic anemia: Secondary | ICD-10-CM | POA: Diagnosis present

## 2016-01-27 DIAGNOSIS — E875 Hyperkalemia: Secondary | ICD-10-CM | POA: Diagnosis not present

## 2016-01-27 DIAGNOSIS — C680 Malignant neoplasm of urethra: Secondary | ICD-10-CM | POA: Diagnosis not present

## 2016-01-27 LAB — BASIC METABOLIC PANEL
Anion gap: 4 — ABNORMAL LOW (ref 5–15)
BUN: 34 mg/dL — AB (ref 6–20)
CO2: 20 mmol/L — ABNORMAL LOW (ref 22–32)
CREATININE: 1.57 mg/dL — AB (ref 0.44–1.00)
Calcium: 8.2 mg/dL — ABNORMAL LOW (ref 8.9–10.3)
Chloride: 115 mmol/L — ABNORMAL HIGH (ref 101–111)
GFR calc Af Amer: 36 mL/min — ABNORMAL LOW (ref >60)
GFR, EST NON AFRICAN AMERICAN: 31 mL/min — AB (ref >60)
GLUCOSE: 99 mg/dL (ref 65–99)
POTASSIUM: 5.6 mmol/L — AB (ref 3.5–5.1)
Sodium: 139 mmol/L (ref 135–145)

## 2016-01-27 NOTE — Progress Notes (Signed)
Pt is A&Ox4 ambulatory. Discharge instructions reviewed, questions concerns denied. Pt request to take meds when at home. Additional instruction provided regarding foley care.

## 2016-01-27 NOTE — Discharge Summary (Signed)
Physician Discharge Summary  Patient ID: Caitlin [name] MRN: [id number] DOB/AGE: [birth year] 75 y.o.  Admit date: 01/25/2016 Discharge date: 01/27/2016  Admission Diagnoses:  Urethral caruncle  Discharge Diagnoses:  Principal Problem:   Urethral caruncle Active Problems:   Urinary tract infection   Acute renal insufficiency   Hyperkalemia   Acute blood loss anemia   Past Medical History:  Diagnosis Date  . Arthritis   . 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 Bladder cancer ([place])   . Breast cancer ([place])   . Chronic kidney disease    does not empty bladder,self cath  . Complication of anesthesia   . GERD (gastroesophageal reflux disease)   . Hypertension   . Mitral valve prolapse   . PONV (postoperative nausea and vomiting)   . Self-catheterizes urinary bladder     Surgeries: Procedure(s): Excision of suburethral mass on 01/25/2016   Consultants (if any):   Discharged Condition: Improved  Hospital Course: Caitlin [name] is an 75 y.o. female who was admitted 01/25/2016 with a diagnosis of Urethral caruncle and went to the operating room on 01/25/2016 and underwent the above named procedures.   She was left with a foley postoperatively.  She has required irrigation for mucous obstruction from the neobladder.   She was hypotensive on POD#1 and had ARI with a Cr of 2.32.   She was kept on Rocephin and remained afebrile.  Her BP is better today as is her Cr.  She has mild hyperkalemia today but was getting K in the IV.  Her UOP has improved.   She will be discharged today and return for a BMP tomorrow and foley removal next week.  She will be sent home with an irrigation set.   She was given perioperative antibiotics:  Anti-infectives    Start     Dose/Rate Route Frequency Ordered Stop   01/26/16 2000  cefTRIAXone (ROCEPHIN) 1 g in dextrose 5 % 50 mL IVPB     1 g 100 mL/hr over 30 Minutes Intravenous Every 24 hours 01/25/16 2358     01/25/16 1630  gentamicin (GARAMYCIN) IVPB  100 mg     100 mg 200 mL/hr over 30 Minutes Intravenous 30 min pre-op 01/25/16 1559 01/25/16 1910   01/25/16 1615  gentamicin (GARAMYCIN) IVPB 100 mg  Status:  Discontinued     100 mg 200 mL/hr over 30 Minutes Intravenous  Once 01/25/16 1615 01/25/16 1616   01/25/16 1600  cefTRIAXone (ROCEPHIN) 2 g in dextrose 5 % 50 mL IVPB     2 g 100 mL/hr over 30 Minutes Intravenous 30 min pre-op 01/25/16 1543 01/25/16 1900   01/25/16 1543  gentamicin (GARAMYCIN) 5 mg/kg in dextrose 5 % 100 mL IVPB  Status:  Discontinued     5 mg/kg 100 mL/hr over 60 Minutes Intravenous 30 min pre-op 01/25/16 1543 01/25/16 1558   01/25/16 0000  cephALEXin (KEFLEX) 500 MG capsule     500 mg Oral 3 times daily 01/25/16 1929      .  She was given sequential compression devices for DVT prophylaxis.  She benefited maximally from the hospital stay and there were no complications.    Recent vital signs:  Vitals:   01/26/16 2058 01/27/16 0615  BP: (!) 120/58 115/64  Pulse: 73 81  Resp: 18 18  Temp: 98.7 F (37.1 C) 98.2 F (36.8 C)    Recent laboratory studies:  Lab Results  Component Value Date   HGB 9.1 (L) 01/26/2016  HGB 11.7 (L) 01/25/2016   HGB 11.4 (L) 02/15/2013   Lab Results  Component Value Date   WBC 10.0 01/26/2016   PLT 331 01/26/2016   Lab Results  Component Value Date   INR 1.01 10/10/2012   Lab Results  Component Value Date   NA 139 01/27/2016   K 5.6 (H) 01/27/2016   CL 115 (H) 01/27/2016   CO2 20 (L) 01/27/2016   BUN 34 (H) 01/27/2016   CREATININE 1.57 (H) 01/27/2016   GLUCOSE 99 01/27/2016    Discharge Medications:     Medication List    TAKE these medications   acetaminophen 325 MG tablet Commonly known as:  TYLENOL Take 650 mg by mouth every 6 (six) hours as needed for pain.   alendronate 35 MG tablet Commonly known as:  FOSAMAX Take 35 mg by mouth every 7 (seven) days. Take with a full glass of water on an empty stomach.   aspirin 81 MG tablet Take 81 mg by  mouth daily.   CALCIUM PO Take 500 mg by mouth daily.   cephALEXin 500 MG capsule Commonly known as:  KEFLEX Take 1 capsule (500 mg total) by mouth 3 (three) times daily.   diphenhydrAMINE 25 MG tablet Commonly known as:  BENADRYL Take 50 mg by mouth at bedtime.   escitalopram 10 MG tablet Commonly known as:  LEXAPRO Take 10 mg by mouth at bedtime.   fexofenadine 60 MG tablet Commonly known as:  ALLEGRA Take 120 mg by mouth at bedtime as needed (allergies).   fluticasone 50 MCG/ACT nasal spray Commonly known as:  FLONASE Place 1 spray into the nose daily as needed for allergies.   HYDROcodone-acetaminophen 5-325 MG tablet Commonly known as:  NORCO Take 1 tablet by mouth every 6 (six) hours as needed for moderate pain.   lisinopril 20 MG tablet Commonly known as:  PRINIVIL,ZESTRIL Take 1 tablet (20 mg total) by mouth daily.   omeprazole 20 MG capsule Commonly known as:  PRILOSEC Take 20 mg by mouth daily.   VITAMIN B1-B12 IJ Inject as directed every 30 (thirty) days.       Diagnostic Studies: No results found.  Disposition: 01-Home or Self Care  Discharge Instructions    Discontinue IV    Complete by:  As directed   Urinary leg bag    Complete by:  As directed      Follow-up Information    Malka So, MD.   Specialty:  Urology Why:  If not scheduled, please call for an appointment for 1 week to remove the foley with me, Dr. Diona Fanti or one of the Nurse practitioners.  Contact information: [place] [address] [phone]            Signed: Malka So 01/27/2016, 8:20 AM

## 2016-01-29 ENCOUNTER — Other Ambulatory Visit: Payer: Self-pay | Admitting: Urology

## 2016-01-29 ENCOUNTER — Ambulatory Visit (HOSPITAL_COMMUNITY)
Admission: RE | Admit: 2016-01-29 | Discharge: 2016-01-29 | Disposition: A | Discharge status: Home / Self Care | Payer: Medicare Other | Source: Ambulatory Visit | Attending: Urology | Admitting: Urology

## 2016-01-29 DIAGNOSIS — C675 Malignant neoplasm of bladder neck: Secondary | ICD-10-CM

## 2016-02-08 ENCOUNTER — Other Ambulatory Visit: Payer: Self-pay | Admitting: Urology

## 2016-02-08 DIAGNOSIS — C680 Malignant neoplasm of urethra: Secondary | ICD-10-CM

## 2016-02-12 ENCOUNTER — Other Ambulatory Visit: Payer: Self-pay | Admitting: Radiology

## 2016-02-17 ENCOUNTER — Ambulatory Visit (HOSPITAL_COMMUNITY)
Admission: RE | Admit: 2016-02-17 | Discharge: 2016-02-17 | Disposition: A | Discharge status: Home / Self Care | Payer: Medicare Other | Source: Ambulatory Visit | Attending: Urology | Admitting: Urology

## 2016-02-17 ENCOUNTER — Encounter (HOSPITAL_COMMUNITY): Payer: Self-pay

## 2016-02-17 DIAGNOSIS — K219 Gastro-esophageal reflux disease without esophagitis: Secondary | ICD-10-CM | POA: Diagnosis not present

## 2016-02-17 DIAGNOSIS — Z853 Personal history of malignant neoplasm of breast: Secondary | ICD-10-CM | POA: Insufficient documentation

## 2016-02-17 DIAGNOSIS — C774 Secondary and unspecified malignant neoplasm of inguinal and lower limb lymph nodes: Secondary | ICD-10-CM | POA: Insufficient documentation

## 2016-02-17 DIAGNOSIS — N189 Chronic kidney disease, unspecified: Secondary | ICD-10-CM | POA: Insufficient documentation

## 2016-02-17 DIAGNOSIS — I129 Hypertensive chronic kidney disease with stage 1 through stage 4 chronic kidney disease, or unspecified chronic kidney disease: Secondary | ICD-10-CM | POA: Diagnosis not present

## 2016-02-17 DIAGNOSIS — C680 Malignant neoplasm of urethra: Secondary | ICD-10-CM | POA: Diagnosis present

## 2016-02-17 DIAGNOSIS — Z7982 Long term (current) use of aspirin: Secondary | ICD-10-CM | POA: Insufficient documentation

## 2016-02-17 DIAGNOSIS — Z906 Acquired absence of other parts of urinary tract: Secondary | ICD-10-CM | POA: Diagnosis not present

## 2016-02-17 LAB — CBC WITH DIFFERENTIAL/PLATELET
BASOS ABS: 0 10*3/uL (ref 0.0–0.1)
BASOS PCT: 0 %
EOS ABS: 0.1 10*3/uL (ref 0.0–0.7)
Eosinophils Relative: 2 %
HEMATOCRIT: 36.9 % (ref 36.0–46.0)
HEMOGLOBIN: 11.6 g/dL — AB (ref 12.0–15.0)
Lymphocytes Relative: 24 %
Lymphs Abs: 1.8 10*3/uL (ref 0.7–4.0)
MCH: 29.7 pg (ref 26.0–34.0)
MCHC: 31.4 g/dL (ref 30.0–36.0)
MCV: 94.6 fL (ref 78.0–100.0)
Monocytes Absolute: 0.4 10*3/uL (ref 0.1–1.0)
Monocytes Relative: 6 %
NEUTROS ABS: 5.1 10*3/uL (ref 1.7–7.7)
NEUTROS PCT: 68 %
Platelets: 506 10*3/uL — ABNORMAL HIGH (ref 150–400)
RBC: 3.9 MIL/uL (ref 3.87–5.11)
RDW: 14.9 % (ref 11.5–15.5)
WBC: 7.4 10*3/uL (ref 4.0–10.5)

## 2016-02-17 LAB — BASIC METABOLIC PANEL
Anion gap: 11 (ref 5–15)
BUN: 24 mg/dL — AB (ref 6–20)
CALCIUM: 9.4 mg/dL (ref 8.9–10.3)
CHLORIDE: 107 mmol/L (ref 101–111)
CO2: 21 mmol/L — AB (ref 22–32)
CREATININE: 1.59 mg/dL — AB (ref 0.44–1.00)
GFR calc non Af Amer: 30 mL/min — ABNORMAL LOW (ref >60)
GFR, EST AFRICAN AMERICAN: 35 mL/min — AB (ref >60)
Glucose, Bld: 98 mg/dL (ref 65–99)
Potassium: 4.4 mmol/L (ref 3.5–5.1)
Sodium: 139 mmol/L (ref 135–145)

## 2016-02-17 LAB — PROTIME-INR
INR: 1.02
PROTHROMBIN TIME: 13.4 s (ref 11.4–15.2)

## 2016-02-17 MED ORDER — FENTANYL CITRATE (PF) 100 MCG/2ML IJ SOLN
INTRAMUSCULAR | Status: AC
  Filled 2016-02-17: qty 4

## 2016-02-17 MED ORDER — MIDAZOLAM HCL 2 MG/2ML IJ SOLN
INTRAMUSCULAR | Status: AC
  Filled 2016-02-17: qty 6

## 2016-02-17 MED ORDER — FENTANYL CITRATE (PF) 100 MCG/2ML IJ SOLN
INTRAMUSCULAR | Status: AC | PRN
  Administered 2016-02-17: 50 ug via INTRAVENOUS

## 2016-02-17 MED ORDER — MIDAZOLAM HCL 2 MG/2ML IJ SOLN
INTRAMUSCULAR | Status: AC | PRN
  Administered 2016-02-17 (×3): 1 mg via INTRAVENOUS

## 2016-02-17 MED ORDER — SODIUM CHLORIDE 0.9 % IV SOLN
INTRAVENOUS | Status: DC
  Administered 2016-02-17: 11:00:00 via INTRAVENOUS

## 2016-02-17 NOTE — Discharge Instructions (Signed)
Needle Biopsy, Care After °These instructions give you information about caring for yourself after your procedure. Your doctor may also give you more specific instructions. Call your doctor if you have any problems or questions after your procedure. °HOME CARE °· Rest as told by your doctor. °· Take medicines only as told by your doctor. °· There are many different ways to close and cover the biopsy site, including stitches (sutures), skin glue, and adhesive strips. Follow instructions from your doctor about: °¨ How to take care of your biopsy site. °¨ When and how you should change your bandage (dressing). °¨ When you should remove your dressing. °¨ Removing whatever was used to close your biopsy site. °· Check your biopsy site every day for signs of infection. Watch for: °¨ Redness, swelling, or pain. °¨ Fluid, blood, or pus. °GET HELP IF: °· You have a fever. °· You have redness, swelling, or pain at the biopsy site, and it lasts longer than a few days. °· You have fluid, blood, or pus coming from the biopsy site. °· You feel sick to your stomach (nauseous). °· You throw up (vomit). °GET HELP RIGHT AWAY IF: °· You are short of breath. °· You have trouble breathing. °· Your chest hurts. °· You feel dizzy or you pass out (faint). °· You have bleeding that does not stop with pressure or a bandage. °· You cough up blood. °· Your belly (abdomen) hurts. °  °This information is not intended to replace advice given to you by your health care provider. Make sure you discuss any questions you have with your health care provider. °  °Document Released: 04/28/2008 Document Revised: 09/30/2014 Document Reviewed: 05/12/2014 °Elsevier Interactive Patient Education ©2016 [employer] ° ° ° °Moderate Conscious Sedation, Adult °Sedation is the use of medicines to promote relaxation and relieve discomfort and anxiety. Moderate conscious sedation is a type of sedation. Under moderate conscious sedation you are less alert than  normal but are still able to respond to instructions or stimulation. Moderate conscious sedation is used during short medical and dental procedures. It is milder than deep sedation or general anesthesia and allows you to return to your regular activities sooner. °LET YOUR HEALTH CARE PROVIDER KNOW ABOUT:  °· Any allergies you have. °· All medicines you are taking, including vitamins, herbs, eye drops, creams, and over-the-counter medicines. °· Use of steroids (by mouth or creams). °· Previous problems you or members of your family have had with the use of anesthetics. °· Any blood disorders you have. °· Previous surgeries you have had. °· Medical conditions you have. °· Possibility of pregnancy, if this applies. °· Use of cigarettes, alcohol, or illegal drugs. °RISKS AND COMPLICATIONS °Generally, this is a safe procedure. However, as with any procedure, problems can occur. Possible problems include: °· Oversedation. °· Trouble breathing on your own. You may need to have a breathing tube until you are awake and breathing on your own. °· Allergic reaction to any of the medicines used for the procedure. °BEFORE THE PROCEDURE °· You may have blood tests done. These tests can help show how well your kidneys and liver are working. They can also show how well your blood clots. °· A physical exam will be done.   °· Only take medicines as directed by your health care provider. You may need to stop taking medicines (such as blood thinners, aspirin, or nonsteroidal anti-inflammatory drugs) before the procedure.   °· Do not eat or drink at least 6 hours before the procedure or   as directed by your health care provider. °· Arrange for a responsible adult, family member, or friend to take you home after the procedure. He or she should stay with you for at least 24 hours after the procedure, until the medicine has worn off. °PROCEDURE  °· An intravenous (IV) catheter will be inserted into one of your veins. Medicine will be able to  flow directly into your body through this catheter. You may be given medicine through this tube to help prevent pain and help you relax. °· The medical or dental procedure will be done. °AFTER THE PROCEDURE °· You will stay in a recovery area until the medicine has worn off. Your blood pressure and pulse will be checked.   °·  Depending on the procedure you had, you may be allowed to go home when you can tolerate liquids and your pain is under control. °  °This information is not intended to replace advice given to you by your health care provider. Make sure you discuss any questions you have with your health care provider. °  °Document Released: 02/08/2001 Document Revised: 06/06/2014 Document Reviewed: 01/21/2013 °Elsevier Interactive Patient Education ©2016 Elsevier Inc. ° °

## 2016-02-17 NOTE — Consult Note (Signed)
Chief Complaint: Cancer of urethra  Referring Physician(s): [place]  Supervising Physician: Daryll Brod  Patient Status: Outpatient  History of Present Illness: Caitlin [name] is a 75 y.o. female with history of remote left breast cancer 2005, sarcomatoid urothelial carcinoma 2009, status post radical cystectomy, with bilateral pelvic lymphadenectomy and ileal neobladder. She also underwent excision of a large fungating suburethral mass in August of this year with pathology revealing high-grade urothelial carcinoma, squamous cell variant. Recent follow-up imaging has revealed some right inguinal lymphadenopathy suspicious for metastatic disease and she presents today for ultrasound-guided biopsy of the right inguinal lymph node.  Past Medical History:  Diagnosis Date  . Arthritis   . 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 Bladder cancer ([place])   . Breast cancer ([employer])   . Chronic kidney disease    does not empty bladder,self cath  . Complication of anesthesia   . GERD (gastroesophageal reflux disease)   . Hypertension   . Mitral valve prolapse   . PONV (postoperative nausea and vomiting)   . Self-catheterizes urinary bladder     Past Surgical History:  Procedure Laterality Date  . ABDOMINAL HYSTERECTOMY  1980   total  . ANTERIOR AND POSTERIOR REPAIR N/A 01/25/2016   Procedure: Excision of suburethral mass;  Surgeon: Irine Seal, MD;  Location: [facility];  Service: Urology;  Laterality: N/A;  . BLADDER SURGERY  2009   due to cancer  . BREAST SURGERY  2005   Left Breast Lumpectomy  . CHOLECYSTECTOMY N/A 02/19/2013   Procedure: LAPAROSCOPIC CHOLECYSTECTOMY WITH INTRAOPERATIVE CHOLANGIOGRAM;  Surgeon: Ralene Ok, MD;  Location: [facility];  Service: General;  Laterality: N/A;  . REPLACEMENT TOTAL KNEE Left     Allergies: Review of patient's allergies indicates no known allergies.  Medications: Prior to Admission medications   Medication Sig Start Date End  Date Taking? Authorizing Provider  acetaminophen (TYLENOL) 325 MG tablet Take 650 mg by mouth every 6 (six) hours as needed for pain.    Historical Provider, MD  alendronate (FOSAMAX) 35 MG tablet Take 35 mg by mouth every 7 (seven) days. Take with a full glass of water on an empty stomach.    Historical Provider, MD  aspirin 81 MG tablet Take 81 mg by mouth daily.    Historical Provider, MD  CALCIUM PO Take 500 mg by mouth daily.    Historical Provider, MD  cephALEXin (KEFLEX) 500 MG capsule Take 1 capsule (500 mg total) by mouth 3 (three) times daily. 01/25/16   Irine Seal, MD  diphenhydrAMINE (BENADRYL) 25 MG tablet Take 50 mg by mouth at bedtime.    Historical Provider, MD  escitalopram (LEXAPRO) 10 MG tablet Take 10 mg by mouth at bedtime.  11/29/15   Historical Provider, MD  fexofenadine (ALLEGRA) 60 MG tablet Take 120 mg by mouth at bedtime as needed (allergies).     Historical Provider, MD  fluticasone (FLONASE) 50 MCG/ACT nasal spray Place 1 spray into the nose daily as needed for allergies.  06/22/12   Historical Provider, MD  HYDROcodone-acetaminophen (NORCO) 5-325 MG tablet Take 1 tablet by mouth every 6 (six) hours as needed for moderate pain. 01/25/16   Irine Seal, MD  lisinopril (PRINIVIL,ZESTRIL) 20 MG tablet Take 1 tablet (20 mg total) by mouth daily. 10/12/12   Belkys A Regalado, MD  omeprazole (PRILOSEC) 20 MG capsule Take 20 mg by mouth daily.  06/25/12   Historical Provider, MD  VITAMIN B1-B12 IJ Inject as directed every  30 (thirty) days.    Historical Provider, MD     Family History  Problem Relation Age of Onset  . Stroke Mother   . Stroke Father   . Asthma Sister     Social History   Social History  . Marital status: Married    Spouse name: N/A  . Number of children: N/A  . Years of education: N/A   Social History Main Topics  . Smoking status: Never Smoker  . Smokeless tobacco: Never Used  . Alcohol use No  . Drug use: No  . Sexual activity: Not Currently    Other Topics Concern  . Not on file   Social History Narrative  . No narrative on file      Review of Systems  currently denies fever, headache, chest pain, dyspnea, cough, abdominal pain, back pain, nausea, vomiting or abnormal bleeding. She does have bilateral hip discomfort. Vital Signs: BP 139/71 (BP Location: Left Arm)   Pulse 86   Temp 97.2 F (36.2 C) (Oral)   Resp 18   SpO2 97%   Physical Exam awake, alert. Chest clear to auscultation bilaterally. Heart with regular rate and rhythm. Abdomen soft, positive bowel sounds, nontender. Lower extremities -no edema. Foley with yellow urine.  Mallampati Score:     Imaging: Dg Chest 2 View  Result Date: 01/29/2016 CLINICAL DATA:  No current chest complaints. History of breast and bladder malignancy with history of cystectomy EXAM: CHEST  2 VIEW COMPARISON:  PA and lateral chest x-ray of April 01, 2015 FINDINGS: The lungs are adequately inflated. There is no focal infiltrate. There is no pleural effusion. The heart and pulmonary vascularity are normal. The mediastinum is normal in width. The bony thorax is unremarkable. IMPRESSION: There is no acute cardiopulmonary abnormality. There are no findings suspicious for metastatic disease. Electronically Signed   By: David  Martinique M.D.   On: 01/29/2016 11:27    Labs:  CBC:  Recent Labs  01/25/16 1600 01/26/16 0924  WBC 13.4* 10.0  HGB 11.7* 9.1*  HCT 37.1 28.4*  PLT 431* 331    COAGS: No results for input(s): INR, APTT in the last 8760 hours.  BMP:  Recent Labs  01/26/16 0924 01/27/16 0517  NA 133* 139  K 4.7 5.6*  CL 110 115*  CO2 18* 20*  GLUCOSE 109* 99  BUN 52* 34*  CALCIUM 8.2* 8.2*  CREATININE 2.32* 1.57*  GFRNONAA 19* 31*  GFRAA 22* 36*    LIVER FUNCTION TESTS: No results for input(s): BILITOT, AST, ALT, ALKPHOS, PROT, ALBUMIN in the last 8760 hours.  TUMOR MARKERS: No results for input(s): AFPTM, CEA, CA199, CHROMGRNA in the last 8760  hours.  Assessment and Plan:  75 y.o. female with history of sarcomatoid urothelial carcinoma 2009, status post radical cystectomy, with bilateral pelvic lymphadenectomy and ileal neobladder. She also underwent excision of a large fungating suburethral mass in August of this year with pathology revealing high-grade urothelial carcinoma, squamous cell variant. Recent follow-up imaging has revealed some right inguinal lymphadenopathy suspicious for metastatic disease and she presents today for ultrasound-guided biopsy of the right inguinal lymph node.Risks and benefits discussed with the patient/husband including, but not limited to bleeding, infection, damage to adjacent structures or low yield requiring additional tests.All of the patient's questions were answered, patient is agreeable to proceed.Consent signed and in chart.     Thank you for this interesting consult.  I greatly enjoyed meeting Caitlin [name] and look forward to participating in their care.  A copy of this report was sent to the requesting provider on this date.  Electronically Signed: D. Rowe Robert 02/17/2016, 11:28 AM   I spent a total of 25 minutes  in face to face in clinical consultation, greater than 50% of which was counseling/coordinating care for US guided right inguinal lymph node biopsy

## 2016-02-17 NOTE — Procedures (Signed)
S/p [place] RT INGUINAL LN CORE BX  No comp Stable Path pending Full report in PACS

## 2016-03-02 ENCOUNTER — Telehealth: Payer: Self-pay | Admitting: Oncology

## 2016-03-02 ENCOUNTER — Ambulatory Visit (HOSPITAL_BASED_OUTPATIENT_CLINIC_OR_DEPARTMENT_OTHER): Payer: Medicare Other | Admitting: Oncology

## 2016-03-02 VITALS — BP 123/58 | HR 74 | Temp 97.8°F | Resp 17 | Ht 62.0 in | Wt 172.0 lb

## 2016-03-02 DIAGNOSIS — C774 Secondary and unspecified malignant neoplasm of inguinal and lower limb lymph nodes: Secondary | ICD-10-CM

## 2016-03-02 DIAGNOSIS — R911 Solitary pulmonary nodule: Secondary | ICD-10-CM | POA: Diagnosis not present

## 2016-03-02 DIAGNOSIS — I10 Essential (primary) hypertension: Secondary | ICD-10-CM

## 2016-03-02 DIAGNOSIS — C679 Malignant neoplasm of bladder, unspecified: Secondary | ICD-10-CM

## 2016-03-02 DIAGNOSIS — Z853 Personal history of malignant neoplasm of breast: Secondary | ICD-10-CM

## 2016-03-02 NOTE — Telephone Encounter (Signed)
11/15 Appointment scheduled for patient per 10/04 LOS.

## 2016-03-02 NOTE — Progress Notes (Signed)
Caitlin [name]   Referring MD: Deah Otsuka 75 y.o.  26-Dec-1940    Reason for Referral: Bladder cancer   HPI: Caitlin [name] presented with hematuria in August 2009. A CT revealed a mass at the left bladder wall. 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 She underwent a radical cystectomy, bilateral pelvic lymphadenectomy, and ileal neobladder in November 2009. No residual carcinoma was identified. Left and right pelvic lymph nodes were negative for malignancy. She has remained in clinical remission since 2009. Approximately 1 month ago she noted the onset of bleeding with self-catheterization and difficulty performing the catheterization. She saw Dr. Jeffie Pollock and was noted to have a fungating suburethral mass that was bleeding. She underwent resection of the mass on 01/26/2016. The mass was grossly excised. 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 The resection margins are positive. A staging CT of the abdomen and pelvis on 02/02/2016 revealed a stable 4 mm left lower lobe nodule. The liver was noted to have an irregular contour suggestive of early cirrhosis. An 11 mm rim enhancing node was seen in the right inguinal region. A prominent but not enlarged lymph node was seen in the right common femoral area. No other lymphadenopathy in the abdomen or pelvis. A chest x-ray 01/29/2016 revealed no evidence of metastatic disease.  Needle biopsy of the right inguinal lymph node on 02/17/2016 confirmed metastatic carcinoma.  She has been referred to Dr. Agustina Caroli at [facility] to consider surgical treatment options.  A Foley catheter has remained in place since resection of the subcutaneous urethral mass.  Past Medical History:  Diagnosis Date  . Arthritis       . Bladder cancer (HCC)-sarcomatoid urothelial carcinoma  September 2009   . Breast cancer (HCC)-Left, T1 b,N0,  adjuvant Aromasin on the MA 27 study    . Chronic kidney disease    does not empty bladder,self cath  . Complication of anesthesia   . GERD (gastroesophageal reflux disease)   . Hypertension   . Mitral valve prolapse   . PONV (postoperative nausea and vomiting)   . G1 P1      Past Surgical History:  Procedure Laterality Date  . ABDOMINAL HYSTERECTOMY  1980   total  . ANTERIOR AND POSTERIOR REPAIR N/A 01/25/2016   Procedure: Excision of suburethral mass;  Surgeon: Irine Seal, MD;  Location: [facility];  Service: Urology;  Laterality: N/A;  . BLADDER SURGERY-Cystectomy/pelvic lymphadenectomy and ileal neobladder   November 2009   due to cancer  . BREAST SURGERY  2005   Left Breast Lumpectomy  . CHOLECYSTECTOMY N/A 02/19/2013   Procedure: LAPAROSCOPIC CHOLECYSTECTOMY WITH INTRAOPERATIVE CHOLANGIOGRAM;  Surgeon: Ralene Ok, MD;  Location: [facility];  Service: General;  Laterality: N/A;  . REPLACEMENT TOTAL KNEE Left     Medications: Reviewed  Allergies: No Known Allergies  Family history: No family history of cancer  Social History:   She lives with her husband and [place]. She works in Press [employer]. She does not use cigarettes or alcohol. No transfusion history. No risk factor for HIV or hepatitis.    ROS:   Positives include: Difficulty with self-catheterization and bleeding with catheterization beginning August 2017, recent urinary tract infection, palpable lymph node in the right groin  A complete ROS was otherwise negative.  Physical Exam:  Blood pressure (!) 123/58, pulse 74, temperature 97.8 F (36.6 C), temperature source Oral, resp. rate 17, height 5\' 2"  (1.575 m),  weight 172 lb (78 kg), SpO2 100 %.  HEENT: Oropharynx without visible mass, neck without mass Lungs: Clear bilaterally Cardiac: Regular rate and rhythm Abdomen: No hepatosplenomegaly, no mass, nontender GU: Foley catheter in place  Vascular: No leg edema Lymph nodes: No cervical, supraclavicular,  axillary, femoral, or left inguinal nodes. 2 cm firm right inguinal node Neurologic: Alert and oriented, the motor exam appears intact in the upper and lower extremities Skin: No rash Musculoskeletal: No spine tenderness   LAB:  CBC  Lab Results  Component Value Date   WBC 7.4 02/17/2016   HGB 11.6 (L) 02/17/2016   HCT 36.9 02/17/2016   MCV 94.6 02/17/2016   PLT 506 (H) 02/17/2016   NEUTROABS 5.1 02/17/2016     CMP      Component Value Date/Time   NA 139 02/17/2016 1100   K 4.4 02/17/2016 1100   CL 107 02/17/2016 1100   CO2 21 (L) 02/17/2016 1100   GLUCOSE 98 02/17/2016 1100   BUN 24 (H) 02/17/2016 1100   CREATININE 1.59 (H) 02/17/2016 1100   CALCIUM 9.4 02/17/2016 1100   PROT 7.9 10/10/2012 1600   ALBUMIN 3.3 (L) 10/10/2012 1600   AST 22 10/10/2012 1600   ALT 22 10/10/2012 1600   ALKPHOS 89 10/10/2012 1600   BILITOT 0.3 10/10/2012 1600   GFRNONAA 30 (L) 02/17/2016 1100   GFRAA 35 (L) 02/17/2016 1100      Imaging: As per history of present illness, CT images from 02/02/2016 reviewed  Assessment/Plan:   1. 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 Breast cancer, left T1b, N0-2005, status post lumpectomy/radiation and Aromasin  3.   Hypertension   Disposition:   Caitlin [name] has been diagnosed  with a local recurrence of a rare subtype of urothelial carcinoma. There is no clinical or x-ray evidence of distant metastatic disease. I will refer her for a chest CT to complete a staging evaluation.  She has been referred to the urology service at [facility] to consider a resection/lymph node dissection and creation of an ileal conduit. She understands this is a rare variant of urothelial carcinoma. Her case is also unusual with the long disease-free interval. I think it is reasonable to consider resection of the urethra/neobladder margin and an inguinal lymph node dissection.  We can consider adjuvant systemic therapy or radiation based on the final pathology.  She will return for an office visit here in 6 weeks. We can alter this schedule based on plans for surgery.  Approximately 50 minutes were spent with the patient today. The majority of the time was used for counseling and coordination of care.  Betsy Coder, MD  03/02/2016, 11:51 AM

## 2016-03-07 ENCOUNTER — Ambulatory Visit: Payer: Medicare Other | Admitting: Oncology

## 2016-03-08 ENCOUNTER — Ambulatory Visit (HOSPITAL_COMMUNITY)
Admission: RE | Admit: 2016-03-08 | Discharge: 2016-03-08 | Disposition: A | Discharge status: Home / Self Care | Payer: Medicare Other | Source: Ambulatory Visit | Attending: Oncology | Admitting: Oncology

## 2016-03-08 DIAGNOSIS — K449 Diaphragmatic hernia without obstruction or gangrene: Secondary | ICD-10-CM | POA: Diagnosis not present

## 2016-03-08 DIAGNOSIS — C679 Malignant neoplasm of bladder, unspecified: Secondary | ICD-10-CM | POA: Diagnosis not present

## 2016-03-08 DIAGNOSIS — I700 Atherosclerosis of aorta: Secondary | ICD-10-CM | POA: Insufficient documentation

## 2016-03-08 DIAGNOSIS — I2510 Atherosclerotic heart disease of native coronary artery without angina pectoris: Secondary | ICD-10-CM | POA: Insufficient documentation

## 2016-03-10 ENCOUNTER — Telehealth: Payer: Self-pay | Admitting: *Deleted

## 2016-03-10 NOTE — Telephone Encounter (Signed)
Patient returned Caitlin [name], Therapist, sports, phone call. As noted below by Caitlin [name], I informed patient that Dr. Benay Spice wants her to contact her PCP (Dr. Dagmar Hait) and inform him that atherosclerosis was noted on recent CT scan. Patient verbalized understanding.

## 2016-03-10 NOTE — Telephone Encounter (Signed)
Received return call from patient.  Shared good news results of the CT chest and next F/U on 04-13-2016 at 0900.  "Tell them thank you.  I will keep this appointment if I do not have surgery."

## 2016-03-10 NOTE — Telephone Encounter (Signed)
-----   Message from Ladell Pier, MD sent at 03/09/2016  9:06 PM EDT ----- Please call patient, chest CT is negative for cancer, f/u as scheduled

## 2016-03-10 NOTE — Telephone Encounter (Signed)
Left message on voicemail for pt to call back. Per Dr. Benay Spice: Atherosclerosis noted on CT- pt should discuss with PCP prior to surgery. Copy routed to PCP.

## 2016-03-21 ENCOUNTER — Telehealth: Payer: Self-pay | Admitting: *Deleted

## 2016-03-21 DIAGNOSIS — C680 Malignant neoplasm of urethra: Secondary | ICD-10-CM

## 2016-03-21 NOTE — Telephone Encounter (Addendum)
Message from pt reporting she saw surgeon, radiation and medical oncologist at [facility]. They recommend chemoradiation. She is requesting radiation referral and sooner appointment.

## 2016-03-21 NOTE — Telephone Encounter (Signed)
Noted recommendation for chemoradiation in [facility].  They are recommending Mitomycin/ 5FU continuous infusion with radiation.   Discussed with Dr. Benay Spice; referral entered for rad/onc. Message for schedulers to call pt with appt 11/2 with Lattie Haw.  Called pt, she understands to expect call from schedulers with appts. Pt reports [facility] recommends PET and plan to do it there. She will let us know when PET is scheduled. Pt wanted to be sure we had contact info for her navigator at [facility].  [facility] Navigator: [place], [place]

## 2016-03-31 ENCOUNTER — Ambulatory Visit: Payer: Medicare Other | Admitting: Nurse Practitioner

## 2016-03-31 ENCOUNTER — Ambulatory Visit: Payer: Medicare Other

## 2016-03-31 ENCOUNTER — Institutional Professional Consult (permissible substitution): Payer: Medicare Other | Admitting: Radiation Oncology

## 2016-04-01 ENCOUNTER — Ambulatory Visit (HOSPITAL_BASED_OUTPATIENT_CLINIC_OR_DEPARTMENT_OTHER): Payer: Medicare Other | Admitting: Oncology

## 2016-04-01 ENCOUNTER — Telehealth: Payer: Self-pay | Admitting: Oncology

## 2016-04-01 ENCOUNTER — Telehealth: Payer: Self-pay | Admitting: *Deleted

## 2016-04-01 VITALS — BP 121/59 | HR 78 | Temp 98.0°F | Resp 16 | Ht 62.0 in | Wt 170.1 lb

## 2016-04-01 DIAGNOSIS — C774 Secondary and unspecified malignant neoplasm of inguinal and lower limb lymph nodes: Secondary | ICD-10-CM

## 2016-04-01 DIAGNOSIS — C679 Malignant neoplasm of bladder, unspecified: Secondary | ICD-10-CM

## 2016-04-01 DIAGNOSIS — Z853 Personal history of malignant neoplasm of breast: Secondary | ICD-10-CM | POA: Diagnosis not present

## 2016-04-01 DIAGNOSIS — I10 Essential (primary) hypertension: Secondary | ICD-10-CM | POA: Diagnosis not present

## 2016-04-01 NOTE — Telephone Encounter (Signed)
Called pt re: missed visit on 11/2. She had PET done at [facility] 11/2, requested to reschedule office visit. Appt given for today at [facility]. Pt voiced appreciation for call.

## 2016-04-01 NOTE — Telephone Encounter (Signed)
Message sent to chemo scheduler to add chemo. Picc placement scheduled for 04/25/16 @ [facility]. Patient given  Instructions. Chemo Education scheduled for 04/07/16. Follow up Appointments scheduled per 04/01/16 los. AVS report and appointment schedule given to patient, per 04/01/16 los.

## 2016-04-01 NOTE — Progress Notes (Signed)
GU Location of Tumor / Histology: urothelial carcinoma   Caitlin [name] presented with the onset of bleeding with self-catheterization and difficulty performing the catheterization  Biopsies revealed:  02/17/16 Diagnosis Lymph node, needle/core biopsy, right inguinal - METASTATIC CARCINOMA, SEE COMMENT.  01/25/16 Diagnosis Soft tissue mass, biopsy, suburethral mass INFILTRATING HIGH GRADE UROTHELIAL CARCINOMA WITH SQUAMOUS CELL FEATURES (80%) MARGINS OF RESECTION ARE POSITIVE FOR CARCINOMA  Past/Anticipated interventions by urology, if any: 01/25/16 - Procedure: Excision of suburethral mass;  Surgeon: Irine Seal, MD.  She had a radical cystectomy, bilateral pelvic lymphadenectomy, and ileal neobladder in November 2009.   Past/Anticipated interventions by medical oncology, if any: Mitomycin/ 5FU continuous infusion with radiation. Chemotherapy to start on 04/25/16.  Weight changes, if any: yes - has lost 6 lbs from not snacking.  Bowel/Bladder complaints, if any: no   Nausea/Vomiting, if any: no  Pain issues, if any:  no  SAFETY ISSUES:  Prior radiation? Yes - had radiation in 2005 to her left breast by Dr. Valere Dross  Pacemaker/ICD? no  Possible current pregnancy? no  Is the patient on methotrexate? no  Current Complaints / other details:  PET scan done 11/2 at [facility].  BP (!) 127/51 (BP Location: Right Arm, Patient Position: Sitting)   Pulse 79   Temp 97.7 F (36.5 C) (Oral)   Ht 5\' 2"  (1.575 m)   Wt 172 lb 3.2 oz (78.1 kg)   SpO2 100%   BMI 31.50 kg/m    Wt Readings from Last 3 Encounters:  04/07/16 172 lb 3.2 oz (78.1 kg)  04/01/16 170 lb 1.6 oz (77.2 kg)  03/02/16 172 lb (78 kg)

## 2016-04-01 NOTE — Progress Notes (Signed)
[place] OFFICE PROGRESS NOTE   Diagnosis: Urothelial carcinoma  INTERVAL HISTORY:   Caitlin [name] returns as scheduled. She feels well. The right inguinal lymph node has not enlarged. No pain. No new complaint. She has been evaluated by the urology an oncology services at [facility]. They discussed treating the current tumor as a urethral primary versus a recurrence of the bladder carcinoma. They discussed options including an inguinal lymph node dissection/ureterectomy versus chemotherapy/radiation. Treatment with 5-FU-mitomycin-C and radiation is recommended. She would like to have treatment in [place].  A staging PET scan on 03/31/2016 at [facility] revealed right external iliac and right inguinal lymph node metastases. A sclerotic lesion at the cervical spine, left C3, is concerning for metastasis. No other evidence of metastatic disease.  Objective:  Vital signs in last 24 hours:  Blood pressure (!) 121/59, pulse 78, temperature 98 F (36.7 C), temperature source Oral, resp. rate 16, height 5\' 2"  (1.575 m), weight 170 lb 1.6 oz (77.2 kg), SpO2 100 %.    HEENT: Neck without mass Lymphatics: No cervical, supra-clavicular, or axillary nodes. Firm 2 cm right inguinal node Resp: Lungs clear bilaterally Cardio: Regular rate and rhythm GI: No hepatosplenomegaly, nontender, no mass Vascular: No leg edema   Lab Results:  Lab Results  Component Value Date   WBC 7.4 02/17/2016   HGB 11.6 (L) 02/17/2016   HCT 36.9 02/17/2016   MCV 94.6 02/17/2016   PLT 506 (H) 02/17/2016   NEUTROABS 5.1 02/17/2016     Medications: I have reviewed the patient's current medications.  Assessment/Plan: 1. 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 Cystectomy, bilateral pelvic lymphadenectomy, and ileal neobladder 04/21/2008-no residual malignancy identified ? 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 Chest x-ray 01/29/2016-negative for metastatic disease ? CT abdomen/pelvis 02/02/2016-enlarged right inguinal lymph node with a borderline adjacent right common femoral node ? Biopsy of right inguinal lymph node on 02/17/2016 confirming metastatic carcinoma, pathology review at [facility] found the inguinal lymph node biopsy to be consistent with metastatic carcinoma similar to the urethral mass. ? PET scan at [facility] on 03/31/2016 revealed right external iliac and inguinal hypermetabolic lymph nodes and hypermetabolic activity associated with a sclerotic lesion at C3   2. Breast cancer, left T1b, N0-2005, status post lumpectomy/radiation and Aromasin  3.   Hypertension    Disposition:  Caitlin [name] appears stable. The urethral mass and metastatic lymph node are most likely related to recurrence of the bladder carcinoma from 2009. It is possible the current presentation represents a urethral primary.  She has been evaluated at [facility] Chemotherapy/radiation is recommended as opposed to surgery. She is scheduled to see Dr.Kinard next week.  Dr. Berline Lopes at [facility] recommends 5-FU/mitomycin-C and radiation. I discussed concurrent chemotherapy and radiation with Caitlin [name]. She agrees to proceed. We discussed the standard 5-FU/mitomycin regimen given commonly for patients with anal cancer. I reviewed the potential toxicities associated with this regimen including the chance for nausea/vomiting, mucositis, diarrhea, alopecia, and hematologic toxicity. We discussed the hyperpigmentation and hand/foot syndrome associated with 5-fluorouracil. We discussed the hemolytic uremic syndrome rarely seen with mitomycin-C.  Caitlin [name] will attend a chemotherapy teaching class. I anticipate radiation and chemotherapy to start on 04/25/2016. She will return for an office visit that day. She  will be referred for placement of a PICC on 04/25/2016.  We will obtain the outside PET images for review here. We will decide on additional imaging of the neck after review of the PET images and consultation with Dr. Sondra Come.  Approximately 40 minutes were spent with the patient today. The majority of the time was used for counseling and coordination of care.     Betsy Coder, MD  04/01/2016  3:45 PM

## 2016-04-04 ENCOUNTER — Telehealth: Payer: Self-pay | Admitting: *Deleted

## 2016-04-04 ENCOUNTER — Ambulatory Visit: Payer: Medicare Other | Admitting: Oncology

## 2016-04-04 NOTE — Telephone Encounter (Signed)
Per LOS I have scheduled appts and notified the scheduler 

## 2016-04-05 ENCOUNTER — Inpatient Hospital Stay
Admission: RE | Admit: 2016-04-05 | Discharge: 2016-04-05 | Disposition: A | Discharge status: Home / Self Care | Payer: Self-pay | Source: Ambulatory Visit | Attending: Radiation Oncology | Admitting: Radiation Oncology

## 2016-04-05 ENCOUNTER — Other Ambulatory Visit: Payer: Self-pay | Admitting: Radiation Oncology

## 2016-04-05 DIAGNOSIS — C801 Malignant (primary) neoplasm, unspecified: Secondary | ICD-10-CM

## 2016-04-07 ENCOUNTER — Encounter: Payer: Self-pay | Admitting: Radiation Oncology

## 2016-04-07 ENCOUNTER — Other Ambulatory Visit: Payer: Medicare Other

## 2016-04-07 ENCOUNTER — Ambulatory Visit
Admission: RE | Admit: 2016-04-07 | Discharge: 2016-04-07 | Disposition: A | Discharge status: Home / Self Care | Payer: Medicare Other | Source: Ambulatory Visit | Attending: Radiation Oncology | Admitting: Radiation Oncology

## 2016-04-07 ENCOUNTER — Other Ambulatory Visit (HOSPITAL_BASED_OUTPATIENT_CLINIC_OR_DEPARTMENT_OTHER): Payer: Medicare Other

## 2016-04-07 ENCOUNTER — Encounter: Payer: Self-pay | Admitting: *Deleted

## 2016-04-07 DIAGNOSIS — Z79899 Other long term (current) drug therapy: Secondary | ICD-10-CM | POA: Insufficient documentation

## 2016-04-07 DIAGNOSIS — Z923 Personal history of irradiation: Secondary | ICD-10-CM | POA: Insufficient documentation

## 2016-04-07 DIAGNOSIS — Z825 Family history of asthma and other chronic lower respiratory diseases: Secondary | ICD-10-CM | POA: Insufficient documentation

## 2016-04-07 DIAGNOSIS — K219 Gastro-esophageal reflux disease without esophagitis: Secondary | ICD-10-CM | POA: Insufficient documentation

## 2016-04-07 DIAGNOSIS — R590 Localized enlarged lymph nodes: Secondary | ICD-10-CM | POA: Insufficient documentation

## 2016-04-07 DIAGNOSIS — Z853 Personal history of malignant neoplasm of breast: Secondary | ICD-10-CM | POA: Diagnosis not present

## 2016-04-07 DIAGNOSIS — Z510 Encounter for antineoplastic radiation therapy: Secondary | ICD-10-CM | POA: Diagnosis present

## 2016-04-07 DIAGNOSIS — Z823 Family history of stroke: Secondary | ICD-10-CM | POA: Insufficient documentation

## 2016-04-07 DIAGNOSIS — Z96652 Presence of left artificial knee joint: Secondary | ICD-10-CM | POA: Insufficient documentation

## 2016-04-07 DIAGNOSIS — M1990 Unspecified osteoarthritis, unspecified site: Secondary | ICD-10-CM | POA: Insufficient documentation

## 2016-04-07 DIAGNOSIS — N189 Chronic kidney disease, unspecified: Secondary | ICD-10-CM | POA: Insufficient documentation

## 2016-04-07 DIAGNOSIS — C679 Malignant neoplasm of bladder, unspecified: Secondary | ICD-10-CM

## 2016-04-07 DIAGNOSIS — Z7983 Long term (current) use of bisphosphonates: Secondary | ICD-10-CM | POA: Diagnosis not present

## 2016-04-07 DIAGNOSIS — Z7951 Long term (current) use of inhaled steroids: Secondary | ICD-10-CM | POA: Insufficient documentation

## 2016-04-07 DIAGNOSIS — I129 Hypertensive chronic kidney disease with stage 1 through stage 4 chronic kidney disease, or unspecified chronic kidney disease: Secondary | ICD-10-CM | POA: Insufficient documentation

## 2016-04-07 DIAGNOSIS — Z906 Acquired absence of other parts of urinary tract: Secondary | ICD-10-CM | POA: Insufficient documentation

## 2016-04-07 DIAGNOSIS — Z7982 Long term (current) use of aspirin: Secondary | ICD-10-CM | POA: Insufficient documentation

## 2016-04-07 LAB — CBC WITH DIFFERENTIAL/PLATELET
BASO%: 0.9 % (ref 0.0–2.0)
BASOS ABS: 0.1 10*3/uL (ref 0.0–0.1)
EOS ABS: 0.2 10*3/uL (ref 0.0–0.5)
EOS%: 2.3 % (ref 0.0–7.0)
HCT: 37.2 % (ref 34.8–46.6)
HGB: 11.9 g/dL (ref 11.6–15.9)
LYMPH%: 24.8 % (ref 14.0–49.7)
MCH: 29.7 pg (ref 25.1–34.0)
MCHC: 32 g/dL (ref 31.5–36.0)
MCV: 93 fL (ref 79.5–101.0)
MONO#: 0.7 10*3/uL (ref 0.1–0.9)
MONO%: 7.8 % (ref 0.0–14.0)
NEUT#: 5.7 10*3/uL (ref 1.5–6.5)
NEUT%: 64.2 % (ref 38.4–76.8)
Platelets: 405 10*3/uL — ABNORMAL HIGH (ref 145–400)
RBC: 4 10*6/uL (ref 3.70–5.45)
RDW: 14.4 % (ref 11.2–14.5)
WBC: 8.8 10*3/uL (ref 3.9–10.3)
lymph#: 2.2 10*3/uL (ref 0.9–3.3)

## 2016-04-07 LAB — COMPREHENSIVE METABOLIC PANEL
ALBUMIN: 3.4 g/dL — AB (ref 3.5–5.0)
ALK PHOS: 52 U/L (ref 40–150)
ALT: 11 U/L (ref 0–55)
AST: 16 U/L (ref 5–34)
Anion Gap: 10 mEq/L (ref 3–11)
BUN: 38.5 mg/dL — AB (ref 7.0–26.0)
CALCIUM: 9 mg/dL (ref 8.4–10.4)
CHLORIDE: 109 meq/L (ref 98–109)
CO2: 19 mEq/L — ABNORMAL LOW (ref 22–29)
Creatinine: 1.5 mg/dL — ABNORMAL HIGH (ref 0.6–1.1)
EGFR: 32 mL/min/{1.73_m2} — AB (ref >90)
GLUCOSE: 69 mg/dL — AB (ref 70–140)
POTASSIUM: 4.4 meq/L (ref 3.5–5.1)
SODIUM: 138 meq/L (ref 136–145)
Total Bilirubin: 0.22 mg/dL (ref 0.20–1.20)
Total Protein: 7.5 g/dL (ref 6.4–8.3)

## 2016-04-07 NOTE — Progress Notes (Signed)
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 female who presented with bleeding with self-catheterization and difficulty performing catheterization. Patient has a history of a mass on the left bladder wall  in August 2009. She had a transurethral resection on 01/31/2008. She underwent a radical cystectomy, bilateral pelvic lymphadenectomy, and ileal neobladder in November, 2009. There was no residual carcinoma and nodes were negative. She remained in remission since 2009.   Bleeding with self-catheterization began in September 2017. Right inguinal lymph node biopsy on 02/17/16 revealed metastatic carcinoma. 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 Margins were positive for carcinoma. Following biopsy, patient had an excision of the suburethral mass by Dr. Jeffie Pollock. Patient had a PET scan completed at [facility]. Patient is to start chemotherapy on 04/25/16. She will be administered Mitomycin/ 5FU continuous infusion with radiation. Patient reports she is back to self catheterizing.  Patient notes a 6 lb weight loss. She denies bowel/bladder complaints, nausea/vomiting, or pain at this time. Patient denies a pacemaker.  PREVIOUS RADIATION THERAPY: Yes; 2005 Left breast Dr. Valere Dross, for left breast cancer, 60.4 Gy  PAST MEDICAL HISTORY:  has a past medical history of Arthritis; Bladder absent (Pt had bladder removed in 2010); Bladder cancer ([place]); Breast cancer ([place]); Chronic kidney disease; Complication of anesthesia; GERD (gastroesophageal reflux  disease); Hypertension; Mitral valve prolapse; PONV (postoperative nausea and vomiting); and Self-catheterizes urinary bladder.    PAST SURGICAL HISTORY: Past Surgical History:  Procedure Laterality Date  . ABDOMINAL HYSTERECTOMY  1980   total  . ANTERIOR AND POSTERIOR REPAIR N/A 01/25/2016   Procedure: Excision of suburethral mass;  Surgeon: Irine Seal, MD;  Location: [facility];  Service: Urology;  Laterality: N/A;  . BLADDER SURGERY  2009   due to cancer  . BREAST SURGERY  2005   Left Breast Lumpectomy  . CHOLECYSTECTOMY N/A 02/19/2013   Procedure: LAPAROSCOPIC CHOLECYSTECTOMY WITH INTRAOPERATIVE CHOLANGIOGRAM;  Surgeon: Ralene Ok, MD;  Location: [facility];  Service: General;  Laterality: N/A;  . REPLACEMENT TOTAL KNEE Left     FAMILY HISTORY: family history includes Asthma in her sister; Stroke in her father and mother.  SOCIAL HISTORY:  reports that she has never smoked. She has never used smokeless tobacco. She reports that she does not drink alcohol or use drugs.  ALLERGIES: Patient has no known allergies.  MEDICATIONS:  Current Outpatient Prescriptions  Medication Sig Dispense Refill  . acetaminophen (TYLENOL) 325 MG tablet Take 650 mg by mouth every 6 (six) hours as needed for pain.    [name] alendronate (FOSAMAX) 35 MG tablet Take 35 mg by mouth every 7 (seven) days. Take with a full glass of water on an empty stomach.    [name] aspirin 81 MG tablet Take 81 mg by mouth daily.    [name] CALCIUM PO Take 500 mg by mouth daily.    . Cholecalciferol (VITAMIN D3) 5000 units TABS Take by mouth.    . diphenhydrAMINE (BENADRYL) 25 MG tablet Take 50 mg by mouth at bedtime.    [name] escitalopram (LEXAPRO) 10 MG tablet Take 10 mg by mouth  at bedtime.     . fluticasone (FLONASE) 50 MCG/ACT nasal spray Place 1 spray into the nose daily as needed for allergies.     [name] lisinopril (PRINIVIL,ZESTRIL) 20 MG tablet Take 1 tablet (20 mg total) by mouth daily. 30 tablet 0  . omeprazole (PRILOSEC) 20 MG capsule Take  20 mg by mouth daily.     [name] VITAMIN B1-B12 IJ Inject as directed every 30 (thirty) days.    [name] HYDROcodone-acetaminophen (NORCO) 5-325 MG tablet Take 1 tablet by mouth every 6 (six) hours as needed for moderate pain. (Patient not taking: Reported on 04/07/2016) 15 tablet 0   No current facility-administered medications for this encounter.     REVIEW OF SYSTEMS:  A 15 point review of systems is documented in the electronic medical record. This was obtained by the nursing staff. However, I reviewed this with the patient to discuss relevant findings and make appropriate changes. No pain or bleeding with self catheterization at this time. No neck pain or pain in the inguinal area   PHYSICAL EXAM:  height is 5\' 2"  (1.575 m) and weight is 172 lb 3.2 oz (78.1 kg). Her oral temperature is 97.7 F (36.5 C). Her blood pressure is 127/51 (abnormal) and her pulse is 79. Her oxygen saturation is 100%.   General: Alert and oriented, in no acute distress HEENT: Head is normocephalic. Extraocular movements are intact. Oropharynx is clear. Neck: Neck is supple, no palpable cervical or supraclavicular lymphadenopathy. Heart: Regular in rate and rhythm with no murmurs, rubs, or gallops. Chest: Clear to auscultation bilaterally, with no rhonchi, wheezes, or rales. Abdomen: Soft, nontender, nondistended, with no rigidity or guarding. Vertical scar in the lower abdomen/supra pubic area Extremities: No cyanosis or edema. Lymphatics: see Neck Exam Skin: No concerning lesions. Musculoskeletal: symmetric strength and muscle tone throughout. Neurologic: Cranial nerves II through XII are grossly intact. No obvious focalities. Speech is fluent. Coordination is intact. Psychiatric: Judgment and insight are intact. Affect is appropriate. Right breast: no palpable mass or nipple discharge. Left breast: tattoos in place from previous radiation treatment. Some mild edema in nipple areolar complex, but no palpable mass or nipple  discharge.  Pelvic exam there is no palpable or visible signs of residual tumor in the periurethral area,  no visible lesions in the vagina or palpable pelvic masses. Patient has a ~ 2 cm right inguinal lymph node  ECOG = 1  LABORATORY DATA:  Lab Results  Component Value Date   WBC 8.8 04/07/2016   HGB 11.9 04/07/2016   HCT 37.2 04/07/2016   MCV 93.0 04/07/2016   PLT 405 (H) 04/07/2016   NEUTROABS 5.7 04/07/2016   Lab Results  Component Value Date   NA 138 04/07/2016   K 4.4 04/07/2016   CL 107 02/17/2016   CO2 19 (L) 04/07/2016   GLUCOSE 69 (L) 04/07/2016   CREATININE 1.5 (H) 04/07/2016   CALCIUM 9.0 04/07/2016      RADIOGRAPHY: Ct Chest Wo Contrast  Result Date: 03/08/2016 CLINICAL DATA:  Staging recurrent bladder cancer diagnosed 01/26/2016. EXAM: CT CHEST WITHOUT CONTRAST TECHNIQUE: Multidetector CT imaging of the chest was performed following the standard protocol without IV contrast. COMPARISON:  02/02/2016 CT abdomen/ pelvis. 01/29/2016 chest radiograph. FINDINGS: Cardiovascular: Normal heart size. No significant pericardial fluid/thickening. Left main, left anterior descending, left circumflex and right coronary atherosclerosis. Atherosclerotic nonaneurysmal thoracic aorta. Normal caliber pulmonary arteries. Mediastinum/Nodes: No discrete thyroid nodules. Unremarkable esophagus. No pathologically enlarged axillary, mediastinal or gross hilar lymph nodes, noting  limited sensitivity for the detection of hilar adenopathy on this noncontrast study. Lungs/Pleura: No pneumothorax. No pleural effusion. Bandlike subpleural consolidation with associated calcifications at both lung apices, consistent with mild pleural-parenchymal scarring. Left lower lobe 4 mm pulmonary nodule (series 5/ image 109) appears stable back to the 03/13/2012, most consistent with a benign etiology. Nonspecific patchy mild subpleural reticulation throughout both lungs is not appreciably changed at the lung bases  back to 07/15/2011. No acute consolidative airspace disease, lung masses or additional significant pulmonary nodules. Upper abdomen: Small to moderate hiatal hernia. Musculoskeletal: No aggressive appearing focal osseous lesions. Minimal thoracic spondylosis. IMPRESSION: 1. No evidence of metastatic disease in the chest. 2. Aortic atherosclerosis. Left main and 3 vessel coronary atherosclerosis. 3. Small to moderate hiatal hernia. Electronically Signed   By: Ilona Sorrel M.D.   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 Recent PET scanning [facility] shows right external iliac and right inguinal adenopathy. There is also a sclerotic lesion within the cervical spine at the level of C3 suspicious for metastatic disease.   PLAN: Patient will be set up for an MRI of the cervical spine for further evaluation. She is not a candidate for IV contrast given her renal insufficiency. If the patient is found to have metastatic disease to the cervical spine I would not recommend combination therapy for definitive treatment however consideration for palliative radiation therapy possibly to the right inguinal lymph node and cervical spine lesion may be indicated prior to initiating systemic chemotherapy.  I spent 60 minutes minutes face to face with the patient and more than 50% of that time was spent in counseling and/or coordination of care.   ------------------------------------------------  Blair Promise, PhD, MD  This document serves as a record of services personally performed by Gery Pray, MD. It was created on his behalf by Bethann Humble, a trained medical scribe. The creation of this record is based on the scribe's personal observations and the provider's statements to them. This document has been checked and  approved by the attending provider.

## 2016-04-07 NOTE — Progress Notes (Signed)
Please see the Nurse Progress Note in the MD Initial Consult Encounter for this patient. 

## 2016-04-08 ENCOUNTER — Telehealth: Payer: Self-pay | Admitting: *Deleted

## 2016-04-08 NOTE — Telephone Encounter (Signed)
CALLED PATIENT TO INFORM OF MRI ON 04-12-16- ARRIVAL TIME - 7:30 AM, NO RESTRICTIONS TO TEST @ [facility] AND [facility] VISIT WITH DR. KINARD ON 04-18-16 10:30 AM FOR 11 AM, SPOKE WITH PATIENT AND SHE IS AWARE OF THESE APPTS.

## 2016-04-12 ENCOUNTER — Ambulatory Visit (HOSPITAL_COMMUNITY)
Admission: RE | Admit: 2016-04-12 | Discharge: 2016-04-12 | Disposition: A | Discharge status: Home / Self Care | Payer: Medicare Other | Source: Ambulatory Visit | Attending: Radiation Oncology | Admitting: Radiation Oncology

## 2016-04-12 DIAGNOSIS — M1288 Other specific arthropathies, not elsewhere classified, other specified site: Secondary | ICD-10-CM | POA: Insufficient documentation

## 2016-04-12 DIAGNOSIS — M4312 Spondylolisthesis, cervical region: Secondary | ICD-10-CM | POA: Insufficient documentation

## 2016-04-12 DIAGNOSIS — C679 Malignant neoplasm of bladder, unspecified: Secondary | ICD-10-CM

## 2016-04-13 ENCOUNTER — Ambulatory Visit (HOSPITAL_BASED_OUTPATIENT_CLINIC_OR_DEPARTMENT_OTHER): Payer: Medicare Other | Admitting: Oncology

## 2016-04-13 ENCOUNTER — Telehealth: Payer: Self-pay | Admitting: Oncology

## 2016-04-13 VITALS — BP 124/45 | HR 72 | Temp 97.5°F | Resp 18 | Ht 62.0 in | Wt 171.1 lb

## 2016-04-13 DIAGNOSIS — Z853 Personal history of malignant neoplasm of breast: Secondary | ICD-10-CM

## 2016-04-13 DIAGNOSIS — I10 Essential (primary) hypertension: Secondary | ICD-10-CM | POA: Diagnosis not present

## 2016-04-13 DIAGNOSIS — R7989 Other specified abnormal findings of blood chemistry: Secondary | ICD-10-CM

## 2016-04-13 DIAGNOSIS — C679 Malignant neoplasm of bladder, unspecified: Secondary | ICD-10-CM

## 2016-04-13 DIAGNOSIS — C774 Secondary and unspecified malignant neoplasm of inguinal and lower limb lymph nodes: Secondary | ICD-10-CM

## 2016-04-13 NOTE — Progress Notes (Signed)
[place] OFFICE PROGRESS NOTE   Diagnosis: Urothelial carcinoma  INTERVAL HISTORY:   Ms. Fehrmann returns as scheduled. No new complaint. No difficulty with urination. She underwent an MRI of the cervical spine on 04/12/2016 to follow-up on the PET finding from [facility]. There is facet arthropathy at C3-4, worse on the left. There is edema of the left facet joint. This is felt to explain the PET finding. There is a low suspicion for metastatic disease.  Objective:  Vital signs in last 24 hours:  Blood pressure (!) 124/45, pulse 72, temperature 97.5 F (36.4 C), temperature source Oral, resp. rate 18, height 5\' 2"  (1.575 m), weight 171 lb 1.6 oz (77.6 kg), SpO2 99 %.    Physical examination-not performed today  Lab Results:  Lab Results  Component Value Date   WBC 8.8 04/07/2016   HGB 11.9 04/07/2016   HCT 37.2 04/07/2016   MCV 93.0 04/07/2016   PLT 405 (H) 04/07/2016   NEUTROABS 5.7 04/07/2016    BUN 38.5, creatinine 1.5   Imaging:  Mr Cervical Spine Wo Contrast  Result Date: 04/12/2016 CLINICAL DATA:  Personal history of bladder cancer. Abnormal finding at the C3 level on PET scan. EXAM: MRI CERVICAL SPINE WITHOUT CONTRAST TECHNIQUE: Multiplanar, multisequence MR imaging of the cervical spine was performed. No intravenous contrast was administered. COMPARISON:  PET CT 03/31/2016 FINDINGS: Alignment: Straightening and kyphotic curvature in the cervical region. 2 mm anterolisthesis C3-4 secondary to facet arthropathy. Vertebrae: No vertebral body lesion. Abnormal signal in the region of the left C3-4 facet. See below. Cord: No cord compression or primary cord lesion. Posterior Fossa, vertebral arteries, paraspinal tissues: Negative except for sinusitis of the sphenoid sinus. Disc levels: Foramen magnum is widely patent. C1-2 shows ordinary osteoarthritis. C2-3: Chronic facet fusion on the left. Wide patency of the canal and foramina. C3-4: Facet arthropathy much worse  on the left than the right. 2 mm of anterolisthesis because of this. Mild bulging of the disc. Edema of the left facet joint, particularly at the superior articular process of C4 an the transverse process of C3. I think this quite likely explains the PET scan finding. The pattern of disease is not likely to represent metastatic disease. C4-5:  Mild bulging of the disc.  No stenosis. C5-6:  Mild bulging of the disc.  No stenosis. C6-7:  Normal interspace. C7-T1:  Normal interspace. IMPRESSION: In correlation with the PET study, there is facet arthropathy on the left at C3-4 that likely explains the abnormal nuclear medicine finding. Left-sided predominant facet arthropathy at C3-4 allows anterolisthesis of 2 mm. There is edema of the joint and of the bones likely to be degenerative. Metastatic disease to this location is felt unlikely. Electronically Signed   By: Nelson Chimes M.D.   On: 04/12/2016 09:03    Medications: I have reviewed the patient's current medications.  Assessment/Plan: 1. 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 Cystectomy, bilateral pelvic lymphadenectomy, and ileal neobladder 04/21/2008-no residual malignancy identified ? 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 Chest x-ray 01/29/2016-negative for metastatic disease ? CT abdomen/pelvis 02/02/2016-enlarged right inguinal lymph node with a borderline adjacent right common femoral node ? Biopsy of right inguinal lymph node on 02/17/2016 confirming metastatic carcinoma, pathology review at [facility] found the inguinal lymph node biopsy to be consistent with metastatic carcinoma  similar to the urethral mass. ? PET scan at [facility] on 03/31/2016 revealed right external iliac and inguinal hypermetabolic lymph  nodes and hypermetabolic activity associated with a sclerotic lesion at C3. MRI 04/12/2016 consistent with degenerative changes at C3-C4   2. Breast cancer, left T1b, N0-2005, status post lumpectomy/radiation and Aromasin  3. Hypertension     Disposition:  Ms. Hartner appears stable. I discussed the case with Dr. Sondra Come last week. The plan is to proceed with concurrent chemotherapy and radiation with the negative cervical MRI.  Ms. Candace Cruise has attended a chemotherapy teaching class. The plan is to proceed with 5-FU/mitomycin-C and concurrent radiation beginning on 04/25/2016. She will return for an office visit on 04/25/2016. She will be scheduled for an office visit and nadir CBC 05/04/2016. The creatinine was mildly elevated on 04/07/2016. We will ask for Dr. Danna Hefty recommendation regarding continuation of lisinopril.  Betsy Coder, MD  04/13/2016  9:24 AM

## 2016-04-13 NOTE — Telephone Encounter (Signed)
Appointments scheduled per 04/13/16 los. AVS report and appointment schedule given to patient per 04/13/16 los. °

## 2016-04-15 NOTE — Progress Notes (Signed)
GU Location of Tumor / Histology: urothelial carcinoma   Caitlin [name] presented with the onset of bleeding with self-catheterization and difficulty performing the catheterization  Biopsies revealed:  02/17/16 Diagnosis Lymph node, needle/core biopsy, right inguinal - METASTATIC CARCINOMA, SEE COMMENT.  01/25/16 Diagnosis Soft tissue mass, biopsy, suburethral mass INFILTRATING HIGH GRADE UROTHELIAL CARCINOMA WITH SQUAMOUS CELL FEATURES (80%) MARGINS OF RESECTION ARE POSITIVE FOR CARCINOMA  Past/Anticipated interventions by urology, if any: 01/25/16 - Procedure: Excision of suburethral mass;  Surgeon: Irine Seal, MD.  She had a radical cystectomy, bilateral pelvic lymphadenectomy, and ileal neobladder in November 2009.   Past/Anticipated interventions by medical oncology, if any: Mitomycin/ 5FU continuous infusion with radiation. Chemotherapy to start on 04/25/16.  Weight changes, if any: yes - has lost 6 lbs from not snacking.  Bowel/Bladder complaints, if any: no   Nausea/Vomiting, if any: no  Pain issues, if any:  no  SAFETY ISSUES:  Prior radiation? Yes - had radiation in 2005 to her left breast by Dr. Valere Dross  Pacemaker/ICD? no  Possible current pregnancy? no  Is the patient on methotrexate? no  Current Complaints / other details:  PET scan done 11/2 at [facility]. The current plan is to proceed with concurrent chemotherapy and radiation with the negative cervical MRI on 04/12/16.  BP (!) 121/59 (BP Location: Right Arm, Patient Position: Sitting)   Pulse 68   Temp 98.1 F (36.7 C) (Oral)   Ht 5\' 2"  (1.575 m)   Wt 170 lb (77.1 kg)   SpO2 100%   BMI 31.09 kg/m   Wt Readings from Last 3 Encounters:  04/13/16 171 lb 1.6 oz (77.6 kg)  04/07/16 172 lb 3.2 oz (78.1 kg)  04/01/16 170 lb 1.6 oz (77.2 kg)

## 2016-04-18 ENCOUNTER — Ambulatory Visit
Admission: RE | Admit: 2016-04-18 | Discharge: 2016-04-18 | Disposition: A | Discharge status: Home / Self Care | Payer: Medicare Other | Source: Ambulatory Visit | Attending: Radiation Oncology | Admitting: Radiation Oncology

## 2016-04-18 ENCOUNTER — Telehealth: Payer: Self-pay | Admitting: *Deleted

## 2016-04-18 ENCOUNTER — Encounter: Payer: Self-pay | Admitting: Radiation Oncology

## 2016-04-18 DIAGNOSIS — C679 Malignant neoplasm of bladder, unspecified: Secondary | ICD-10-CM

## 2016-04-18 DIAGNOSIS — Z510 Encounter for antineoplastic radiation therapy: Secondary | ICD-10-CM | POA: Diagnosis not present

## 2016-04-18 NOTE — Progress Notes (Signed)
Radiation Oncology         [phone] ________________________________  Name: Caitlin [name] MRN: [id number]  Date: 04/18/2016  DOB: August 19, 1940  Re-evaluation Note  CC: Tivis Ringer, MD  Ladell Pier, MD    ICD-9-CM ICD-10-CM   1. 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 With right inguinal and right external iliac lymphadenopathy  Interval Since Last Radiation:  12 years for breast cancer treatment   Narrative:  The patient returns today for routine follow-up following her negative cervical MRI on 04/12/16. The patient presented with onset of bleeding with self-catheterization and difficulty performing the catheterization. 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 The patient is set to begin chemotherapy on 05/02/16.  The patient has lost 6 lbs recently, which she attributes to not snacking. The pateint denies any bowel or bladder complaints. She denies any nausea or vomiting. The patient denies any pain. The patient reports some leakage with her catheters, especially during the night. She occasionally wears Depends. She self catheterizes her neobladder and will use sterile equipment during her chemotherapy and during times of neutropenia.  Since the patient's initial consultation she has undergone a MRI of the cervical spine. This did not confirm metastatic disease but rather a facet joint issue.  Therefore the patient's disease appears to be limited to the pelvis and inguinal area  ALLERGIES:  has No Known Allergies.  Meds: Current Outpatient Prescriptions  Medication Sig Dispense Refill  . acetaminophen (TYLENOL) 325 MG tablet Take 650 mg by mouth every 6 (six) hours as needed for pain.     [name] alendronate (FOSAMAX) 35 MG tablet Take 35 mg by mouth every 7 (seven) days. Take with a full glass of water on an empty stomach.    [name] aspirin 81 MG tablet Take 81 mg by mouth daily.    [name] CALCIUM PO Take 500 mg by mouth daily.    . Cholecalciferol (VITAMIN D3) 5000 units TABS Take by mouth.    . diphenhydrAMINE (BENADRYL) 25 MG tablet Take 50 mg by mouth at bedtime.    [name] escitalopram (LEXAPRO) 10 MG tablet Take 10 mg by mouth at bedtime.     . fluticasone (FLONASE) 50 MCG/ACT nasal spray Place 1 spray into the nose daily as needed for allergies.     [name] lisinopril (PRINIVIL,ZESTRIL) 20 MG tablet Take 1 tablet (20 mg total) by mouth daily. 30 tablet 0  . omeprazole (PRILOSEC) 20 MG capsule Take 20 mg by mouth daily.     [name] VITAMIN B1-B12 IJ Inject as directed every 30 (thirty) days.    [name] amoxicillin (AMOXIL) 500 MG capsule Take 2,000 mg by mouth once. For dental procedures     No current facility-administered medications for this encounter.     Physical Findings: The patient is in no acute distress. Patient is alert and oriented.  height is 5\' 2"  (1.575 m) and weight is 170 lb (77.1 kg). Her oral temperature is 98.1 F (36.7 C). Her blood pressure is 121/59 (abnormal) and her pulse is 68. Her oxygen saturation is 100%. .    Lab Findings: Lab Results  Component Value Date   WBC 8.8 04/07/2016   HGB 11.9 04/07/2016   HCT 37.2 04/07/2016   MCV 93.0 04/07/2016   PLT 405 (  H) 04/07/2016    Radiographic Findings: Mr Cervical Spine Wo Contrast  Result Date: 04/12/2016 CLINICAL DATA:  Personal history of bladder cancer. Abnormal finding at the C3 level on PET scan. EXAM: MRI CERVICAL SPINE WITHOUT CONTRAST TECHNIQUE: Multiplanar, multisequence MR imaging of the cervical spine was performed. No intravenous contrast was administered. COMPARISON:  PET CT 03/31/2016 FINDINGS: Alignment: Straightening and kyphotic curvature in the cervical region. 2 mm anterolisthesis C3-4 secondary to facet  arthropathy. Vertebrae: No vertebral body lesion. Abnormal signal in the region of the left C3-4 facet. See below. Cord: No cord compression or primary cord lesion. Posterior Fossa, vertebral arteries, paraspinal tissues: Negative except for sinusitis of the sphenoid sinus. Disc levels: Foramen magnum is widely patent. C1-2 shows ordinary osteoarthritis. C2-3: Chronic facet fusion on the left. Wide patency of the canal and foramina. C3-4: Facet arthropathy much worse on the left than the right. 2 mm of anterolisthesis because of this. Mild bulging of the disc. Edema of the left facet joint, particularly at the superior articular process of C4 an the transverse process of C3. I think this quite likely explains the PET scan finding. The pattern of disease is not likely to represent metastatic disease. C4-5:  Mild bulging of the disc.  No stenosis. C5-6:  Mild bulging of the disc.  No stenosis. C6-7:  Normal interspace. C7-T1:  Normal interspace. IMPRESSION: In correlation with the PET study, there is facet arthropathy on the left at C3-4 that likely explains the abnormal nuclear medicine finding. Left-sided predominant facet arthropathy at C3-4 allows anterolisthesis of 2 mm. There is edema of the joint and of the bones likely to be degenerative. Metastatic disease to this location is felt unlikely. Electronically Signed   By: Nelson Chimes M.D.   On: 04/12/2016 09:03    Impression:  This is a well appearing 75 year old Caucasian female with a primary diagnosis of Urothelial carcinoma. The patient is a good candidate for radiation therapy in conjunction with chemotherapy.  Plan:  We discussed the risks, benefits, and possible side effects of radiation therapy. We discussed the logistics of radiation therapy for the patient's education. The patient wants to proceed with radiation therapy. We will schedule her for a CT Simulation tomorrow at 10 am and begin treatments December 4 concomitant with chemotherapy. I'm  recommending intensity modulated radiation therapy to limit dose to the femoral head and neck areas in light of radiation treatment to the inguinal area. IMRT is also recommended to decrease dose to the patient's neobladder and small bowel.  -----------------------------------  Blair Promise, PhD, MD   This document serves as a record of services personally performed by Gery Pray, MD. It was created on his behalf by Maryla Morrow, a trained medical scribe. The creation of this record is based on the scribe's personal observations and the provider's statements to them. This document has been checked and approved by the attending provider.

## 2016-04-18 NOTE — Telephone Encounter (Signed)
Call received from patient to inform Dr. Benay Spice that Dr. Sondra Come is planning to move her start of XRT to 05/02/16 from 04/25/16.  Dr. Benay Spice informed.

## 2016-04-18 NOTE — Addendum Note (Signed)
Encounter addended by: Jacqulyn Liner, RN on: 04/18/2016  4:41 PM<BR>    Actions taken: Charge Capture section accepted

## 2016-04-19 ENCOUNTER — Other Ambulatory Visit: Payer: Self-pay | Admitting: Oncology

## 2016-04-19 ENCOUNTER — Ambulatory Visit
Admission: RE | Admit: 2016-04-19 | Discharge: 2016-04-19 | Disposition: A | Discharge status: Home / Self Care | Payer: Medicare Other | Source: Ambulatory Visit | Attending: Radiation Oncology | Admitting: Radiation Oncology

## 2016-04-19 ENCOUNTER — Telehealth: Payer: Self-pay | Admitting: Oncology

## 2016-04-19 DIAGNOSIS — Z510 Encounter for antineoplastic radiation therapy: Secondary | ICD-10-CM | POA: Diagnosis not present

## 2016-04-19 DIAGNOSIS — C679 Malignant neoplasm of bladder, unspecified: Secondary | ICD-10-CM

## 2016-04-19 NOTE — Telephone Encounter (Signed)
lvm to inform pt of r/s appt to 12/4 per los

## 2016-04-25 ENCOUNTER — Other Ambulatory Visit (HOSPITAL_COMMUNITY): Payer: Medicare Other

## 2016-04-25 ENCOUNTER — Other Ambulatory Visit: Payer: Medicare Other

## 2016-04-25 ENCOUNTER — Ambulatory Visit: Payer: Medicare Other | Admitting: Nurse Practitioner

## 2016-04-25 ENCOUNTER — Ambulatory Visit: Payer: Medicare Other

## 2016-04-25 NOTE — Progress Notes (Signed)
  Radiation Oncology         (336) [phone] ________________________________  Name: Caitlin [name] MRN: [id number]  Date: 04/19/2016  DOB: 05-06-1941  SIMULATION AND TREATMENT PLANNING NOTE    ICD-9-CM ICD-10-CM   1. 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 With right inguinal and right external iliac lymphadenopathy   NARRATIVE:  The patient was brought to the [facility].  Identity was confirmed.  All relevant records and images related to the planned course of therapy were reviewed.  The patient freely provided informed written consent to proceed with treatment after reviewing the details related to the planned course of therapy. The consent form was witnessed and verified by the simulation staff.  Then, the patient was set-up in a stable reproducible  supine position for radiation therapy.  CT images were obtained.  Surface markings were placed.  The CT images were loaded into the planning software.  Then the target and avoidance structures were contoured.  Treatment planning then occurred.  The radiation prescription was entered and confirmed.  Then, I designed and supervised the construction of a total of 2 medically necessary complex treatment devices.  I have requested : Intensity Modulated Radiotherapy (IMRT) is medically necessary for this case for the following reason:  Small bowel sparing..  I have ordered:dose calc.  PLAN:  The patient will receive 45 Gy in 25 fractions Along with radiosensitizing chemotherapy directed at the pelvis region. The patient also received a simultaneous integrated boost to the PET positive lymph nodes in the right inguinal and right external iliac chain with 2 doses of 50 and 55 gray.  -----------------------------------    Special Treatment Procedure Note: The patient will be receiving radiosensitizing chemotherapy. Given the  potential of increased toxicities related to combined therapy and the necessity for close monitoring of the patient and blood work, this constitutes a special treatment procedure.    Blair Promise, PhD, MD

## 2016-04-27 DIAGNOSIS — Z510 Encounter for antineoplastic radiation therapy: Secondary | ICD-10-CM | POA: Diagnosis not present

## 2016-05-01 ENCOUNTER — Other Ambulatory Visit: Payer: Self-pay | Admitting: Oncology

## 2016-05-02 ENCOUNTER — Ambulatory Visit: Payer: Medicare Other

## 2016-05-02 ENCOUNTER — Other Ambulatory Visit (HOSPITAL_COMMUNITY): Payer: Medicare Other

## 2016-05-02 ENCOUNTER — Ambulatory Visit (HOSPITAL_BASED_OUTPATIENT_CLINIC_OR_DEPARTMENT_OTHER): Payer: Medicare Other | Admitting: Nurse Practitioner

## 2016-05-02 ENCOUNTER — Telehealth: Payer: Self-pay | Admitting: *Deleted

## 2016-05-02 ENCOUNTER — Ambulatory Visit
Admission: RE | Admit: 2016-05-02 | Discharge: 2016-05-02 | Disposition: A | Discharge status: Home / Self Care | Payer: Medicare Other | Source: Ambulatory Visit | Attending: Radiation Oncology | Admitting: Radiation Oncology

## 2016-05-02 ENCOUNTER — Other Ambulatory Visit (HOSPITAL_BASED_OUTPATIENT_CLINIC_OR_DEPARTMENT_OTHER): Payer: Medicare Other

## 2016-05-02 VITALS — BP 127/59 | HR 83 | Temp 97.7°F | Resp 18 | Ht 62.0 in | Wt 172.6 lb

## 2016-05-02 DIAGNOSIS — C774 Secondary and unspecified malignant neoplasm of inguinal and lower limb lymph nodes: Secondary | ICD-10-CM

## 2016-05-02 DIAGNOSIS — I10 Essential (primary) hypertension: Secondary | ICD-10-CM | POA: Diagnosis not present

## 2016-05-02 DIAGNOSIS — Z853 Personal history of malignant neoplasm of breast: Secondary | ICD-10-CM

## 2016-05-02 DIAGNOSIS — Z510 Encounter for antineoplastic radiation therapy: Secondary | ICD-10-CM | POA: Diagnosis not present

## 2016-05-02 DIAGNOSIS — C679 Malignant neoplasm of bladder, unspecified: Secondary | ICD-10-CM

## 2016-05-02 LAB — CBC WITH DIFFERENTIAL/PLATELET
BASO%: 1 % (ref 0.0–2.0)
Basophils Absolute: 0.1 10*3/uL (ref 0.0–0.1)
EOS%: 1.5 % (ref 0.0–7.0)
Eosinophils Absolute: 0.1 10*3/uL (ref 0.0–0.5)
HEMATOCRIT: 35.2 % (ref 34.8–46.6)
HGB: 11.6 g/dL (ref 11.6–15.9)
LYMPH#: 1.6 10*3/uL (ref 0.9–3.3)
LYMPH%: 18.8 % (ref 14.0–49.7)
MCH: 30.2 pg (ref 25.1–34.0)
MCHC: 32.8 g/dL (ref 31.5–36.0)
MCV: 91.9 fL (ref 79.5–101.0)
MONO#: 0.4 10*3/uL (ref 0.1–0.9)
MONO%: 4.3 % (ref 0.0–14.0)
NEUT%: 74.4 % (ref 38.4–76.8)
NEUTROS ABS: 6.2 10*3/uL (ref 1.5–6.5)
Platelets: 373 10*3/uL (ref 145–400)
RBC: 3.83 10*6/uL (ref 3.70–5.45)
RDW: 14.1 % (ref 11.2–14.5)
WBC: 8.3 10*3/uL (ref 3.9–10.3)

## 2016-05-02 LAB — BASIC METABOLIC PANEL
ANION GAP: 9 meq/L (ref 3–11)
BUN: 24.2 mg/dL (ref 7.0–26.0)
CHLORIDE: 108 meq/L (ref 98–109)
CO2: 21 mEq/L — ABNORMAL LOW (ref 22–29)
Calcium: 9.2 mg/dL (ref 8.4–10.4)
Creatinine: 1.3 mg/dL — ABNORMAL HIGH (ref 0.6–1.1)
EGFR: 39 mL/min/{1.73_m2} — AB (ref >90)
Glucose: 118 mg/dl (ref 70–140)
POTASSIUM: 4 meq/L (ref 3.5–5.1)
Sodium: 138 mEq/L (ref 136–145)

## 2016-05-02 NOTE — Progress Notes (Signed)
  Radiation Oncology         [phone] ________________________________  Name: Caitlin [name] MRN: [id number]  Date: 05/02/2016  DOB: 1941-03-08  Simulation Verification Note    ICD-9-CM ICD-10-CM   1. 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[place]) 188.9 C67.9     Status: outpatient  NARRATIVE: The patient was brought to the treatment unit and placed in the planned treatment position. The clinical setup was verified. Then port films were obtained and uploaded to the radiation oncology medical record software.  The treatment beams were carefully compared against the planned radiation fields. The position location and shape of the radiation fields was reviewed. They targeted volume of tissue appears to be appropriately covered by the radiation beams. Organs at risk appear to be excluded as planned.  Based on my personal review, I approved the simulation verification. The patient's treatment will proceed as planned.  -----------------------------------  Blair Promise, PhD, MD

## 2016-05-02 NOTE — Telephone Encounter (Signed)
Call received from [facility] in IR stating that pt did not show up for PICC placement today and there is no availability until tomorrow at 8:30AM to place PICC.  Call placed to patient and pt states that she checked in this morning in admitting, and admitting told her that she did not have an appt until 12:25PM so she left.  Dr. Benay Spice notified and order received to move chemo until tomorrow, 05/03/16 after PICC placement.  Call placed back to patient to inform her of new PICC placement time on 05/03/16 at 0830 and to check in at radiology and not admitting.  Patient appreciative of call and is currently on her way back for appt at 12:25PM.

## 2016-05-02 NOTE — Progress Notes (Signed)
  [facility] OFFICE PROGRESS NOTE   Diagnosis:  Urothelial carcinoma  INTERVAL HISTORY:   Caitlin [name] returns as scheduled. She is scheduled to have a PICC line placed tomorrow morning. Chemotherapy moved from a start date of today to tomorrow. She feels well. No nausea or vomiting. No mouth sores. No diarrhea. No urinary symptoms. She has a good appetite.  Objective:  Vital signs in last 24 hours:  Blood pressure (!) 127/59, pulse 83, temperature 97.7 F (36.5 C), temperature source Oral, resp. rate 18, height 5\' 2"  (1.575 m), weight 172 lb 9.6 oz (78.3 kg), SpO2 100 %.    HEENT: No thrush or ulcers. Lymphatics: 2 cm firm medial right inguinal lymph node. Resp: Lungs clear bilaterally. Cardio: Regular rate and rhythm. GI: Abdomen soft and nontender. No hepatomegaly. Vascular: No leg edema.   Lab Results:  Lab Results  Component Value Date   WBC 8.3 05/02/2016   HGB 11.6 05/02/2016   HCT 35.2 05/02/2016   MCV 91.9 05/02/2016   PLT 373 05/02/2016   NEUTROABS 6.2 05/02/2016    Imaging:  No results found.  Medications: I have reviewed the patient's current medications.  Assessment/Plan: 1. 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 Cystectomy, bilateral pelvic lymphadenectomy, and ileal neobladder 04/21/2008-no residual malignancy identified ? 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 Chest x-ray 01/29/2016-negative for metastatic disease ? CT abdomen/pelvis 02/02/2016-enlarged right inguinal lymph node with a borderline adjacent right common femoral node ? Biopsyof right inguinal lymph node on 02/17/2016 confirming metastatic carcinoma, pathology review at [facility] found the inguinal lymph node biopsy to be consistent  with metastatic carcinoma similar to the urethral mass. ? PET scan at [facility] on 03/31/2016 revealed right external iliac and inguinal hypermetabolic lymph nodes and hypermetabolic activity associated with a sclerotic lesion at C3. MRI 04/12/2016 consistent with degenerative changes at C3-C4 ? Initiation of radiation 05/02/2016 ? Cycle 1 5-FU/mitomycin C 05/03/2016   2. Breast cancer, left T1b, N0-2005, status post lumpectomy/radiation and Aromasin  3. Hypertension   Disposition: [name] appears stable. She did not have a PICC line placed earlier today as previously scheduled. The PICC placement has been rescheduled to 05/03/2016. We have moved cycle 1 5-FU/mitomycin-C from today to tomorrow. Potential toxicities associated with the chemotherapy again reviewed at today's visit and questions answered. She will return for a follow-up visit/nadir CBC on 05/11/2016. She will contact the office in the interim with any problems.  Ned Card ANP/GNP-BC   05/02/2016  1:29 PM

## 2016-05-03 ENCOUNTER — Ambulatory Visit
Admission: RE | Admit: 2016-05-03 | Discharge: 2016-05-03 | Disposition: A | Discharge status: Home / Self Care | Payer: Medicare Other | Source: Ambulatory Visit | Attending: Radiation Oncology | Admitting: Radiation Oncology

## 2016-05-03 ENCOUNTER — Other Ambulatory Visit: Payer: Self-pay | Admitting: *Deleted

## 2016-05-03 ENCOUNTER — Ambulatory Visit (HOSPITAL_COMMUNITY)
Admission: RE | Admit: 2016-05-03 | Discharge: 2016-05-03 | Disposition: A | Discharge status: Home / Self Care | Payer: Medicare Other | Source: Ambulatory Visit | Attending: Oncology | Admitting: Oncology

## 2016-05-03 ENCOUNTER — Encounter (HOSPITAL_COMMUNITY): Payer: Self-pay | Admitting: Interventional Radiology

## 2016-05-03 ENCOUNTER — Ambulatory Visit (HOSPITAL_BASED_OUTPATIENT_CLINIC_OR_DEPARTMENT_OTHER): Payer: Medicare Other

## 2016-05-03 ENCOUNTER — Other Ambulatory Visit: Payer: Self-pay | Admitting: Oncology

## 2016-05-03 VITALS — BP 128/67 | HR 84 | Temp 98.1°F | Resp 18

## 2016-05-03 VITALS — BP 130/65 | HR 85 | Temp 98.0°F | Ht 62.0 in | Wt 172.4 lb

## 2016-05-03 DIAGNOSIS — Z510 Encounter for antineoplastic radiation therapy: Secondary | ICD-10-CM | POA: Diagnosis not present

## 2016-05-03 DIAGNOSIS — C679 Malignant neoplasm of bladder, unspecified: Secondary | ICD-10-CM

## 2016-05-03 DIAGNOSIS — C774 Secondary and unspecified malignant neoplasm of inguinal and lower limb lymph nodes: Secondary | ICD-10-CM | POA: Diagnosis not present

## 2016-05-03 DIAGNOSIS — Z5111 Encounter for antineoplastic chemotherapy: Secondary | ICD-10-CM

## 2016-05-03 HISTORY — PX: IR GENERIC HISTORICAL: IMG1180011

## 2016-05-03 MED ORDER — MITOMYCIN CHEMO IV INJECTION 20 MG
8.2000 mg/m2 | Freq: Once | INTRAVENOUS | Status: AC
  Administered 2016-05-03: 15 mg via INTRAVENOUS
  Filled 2016-05-03: qty 30

## 2016-05-03 MED ORDER — SODIUM CHLORIDE 0.9 % IV SOLN
800.0000 mg/m2/d | INTRAVENOUS | Status: DC
  Administered 2016-05-03: 5900 mg via INTRAVENOUS
  Filled 2016-05-03: qty 118

## 2016-05-03 MED ORDER — PROCHLORPERAZINE MALEATE 10 MG PO TABS
ORAL_TABLET | ORAL | Status: AC
  Filled 2016-05-03: qty 1

## 2016-05-03 MED ORDER — SODIUM CHLORIDE 0.9 % IV SOLN
Freq: Once | INTRAVENOUS | Status: AC
  Administered 2016-05-03: 10:00:00 via INTRAVENOUS

## 2016-05-03 MED ORDER — LIDOCAINE HCL 1 % IJ SOLN
INTRAMUSCULAR | Status: AC
  Filled 2016-05-03: qty 20

## 2016-05-03 MED ORDER — PROCHLORPERAZINE MALEATE 10 MG PO TABS
10.0000 mg | ORAL_TABLET | Freq: Once | ORAL | Status: AC
  Administered 2016-05-03: 10 mg via ORAL

## 2016-05-03 MED ORDER — PROCHLORPERAZINE MALEATE 10 MG PO TABS: 10.0000 mg | ORAL_TABLET | Freq: Four times a day (QID) | ORAL | 0 refills | Status: AC | PRN

## 2016-05-03 MED ORDER — HEPARIN SOD (PORK) LOCK FLUSH 100 UNIT/ML IV SOLN
250.0000 [IU] | Freq: Once | INTRAVENOUS | Status: DC | PRN
  Filled 2016-05-03: qty 5

## 2016-05-03 MED ORDER — LIDOCAINE HCL 1 % IJ SOLN
INTRAMUSCULAR | Status: DC | PRN
  Administered 2016-05-03: 10 mL

## 2016-05-03 NOTE — Procedures (Signed)
Interventional Radiology Procedure Note  Procedure: Placement of a right brachial vein SL powerPICC, 32cm.  Tip is positioned at the superior cavoatrial junction and catheter is ready for immediate use.  Complications: None Recommendations:  - Ok to shower tomorrow - Do not submerge  - Routine line care   Signed,  Dulcy Fanny. Earleen Newport, DO

## 2016-05-03 NOTE — Patient Instructions (Addendum)
[facility] Discharge Instructions for Patients Receiving Chemotherapy  Today you received the following chemotherapy agents: Mitocycin and Adrucil.  To help prevent nausea and vomiting after your treatment, we encourage you to take your nausea medication as directed.   If you develop nausea and vomiting that is not controlled by your nausea medication, call the clinic.   BELOW ARE SYMPTOMS THAT SHOULD BE REPORTED IMMEDIATELY:  *FEVER GREATER THAN 100.5 F  *CHILLS WITH OR WITHOUT FEVER  NAUSEA AND VOMITING THAT IS NOT CONTROLLED WITH YOUR NAUSEA MEDICATION  *UNUSUAL SHORTNESS OF BREATH  *UNUSUAL BRUISING OR BLEEDING  TENDERNESS IN MOUTH AND THROAT WITH OR WITHOUT PRESENCE OF ULCERS  *URINARY PROBLEMS  *BOWEL PROBLEMS  UNUSUAL RASH Items with * indicate a potential emergency and should be followed up as soon as possible.  Feel free to call the clinic you have any questions or concerns. The clinic phone number is [phone].  Please show the [facility] at check-in to the Emergency Department and triage nurse.  Mitomycin injection What is this medicine? MITOMYCIN (mye toe MYE sin) is a chemotherapy drug. This medicine is used to treat cancer of the stomach and pancreas. This medicine may be used for other purposes; ask your health care provider or pharmacist if you have questions. COMMON BRAND NAME(S): Mutamycin What should I tell my health care provider before I take this medicine? They need to know if you have any of these conditions: -anemia -bleeding disorder -infection (especially a virus infection such as chickenpox, cold sores, or herpes) -kidney disease -low blood counts like low platelets, red blood cells, white blood cells -recent radiation therapy -an unusual or allergic reaction to mitomycin, other chemotherapy agents, other medicines, foods, dyes, or preservatives -pregnant or trying to get pregnant -breast-feeding How should I use  this medicine? This drug is given as an injection or infusion into a vein. It is administered in a hospital or clinic by a specially trained health care professional. Talk to your pediatrician regarding the use of this medicine in children. Special care may be needed. Overdosage: If you think you have taken too much of this medicine contact a poison control center or emergency room at once. NOTE: This medicine is only for you. Do not share this medicine with others. What if I miss a dose? It is important not to miss your dose. Call your doctor or health care professional if you are unable to keep an appointment. What may interact with this medicine? -medicines to increase blood counts like filgrastim, pegfilgrastim, sargramostim -vaccines This list may not describe all possible interactions. Give your health care provider a list of all the medicines, herbs, non-prescription drugs, or dietary supplements you use. Also tell them if you smoke, drink alcohol, or use illegal drugs. Some items may interact with your medicine. What should I watch for while using this medicine? Your condition will be monitored carefully while you are receiving this medicine. You will need important blood work done while you are taking this medicine. This drug may make you feel generally unwell. This is not uncommon, as chemotherapy can affect healthy cells as well as cancer cells. Report any side effects. Continue your course of treatment even though you feel ill unless your doctor tells you to stop. Call your doctor or health care professional for advice if you get a fever, chills or sore throat, or other symptoms of a cold or flu. Do not treat yourself. This drug decreases your body's ability to  fight infections. Try to avoid being around people who are sick. This medicine may increase your risk to bruise or bleed. Call your doctor or health care professional if you notice any unusual bleeding. Be careful brushing and  flossing your teeth or using a toothpick because you may get an infection or bleed more easily. If you have any dental work done, tell your dentist you are receiving this medicine. Avoid taking products that contain aspirin, acetaminophen, ibuprofen, naproxen, or ketoprofen unless instructed by your doctor. These medicines may hide a fever. Do not become pregnant while taking this medicine. Women should inform their doctor if they wish to become pregnant or think they might be pregnant. There is a potential for serious side effects to an unborn child. Talk to your health care professional or pharmacist for more information. Do not breast-feed an infant while taking this medicine. What side effects may I notice from receiving this medicine? Side effects that you should report to your doctor or health care professional as soon as possible: -allergic reactions like skin rash, itching or hives, swelling the face, lips, or tongue -low blood counts - this medicine may decrease the numbof er of white blood cells, red blood cells and platelets. You may be at increased risk for infections and bleeding. -signs of infection - fever or chills, cough, sore throat, pain or difficulty passing urine -signs of decreased platelets or bleeding - bruising, pinpoint red spots on the skin, black, tarry stools, blood in the urine -signs of decreased red blood cells - unusually weak or tired, fainting spells, lightheadedness -breathing problems -changes in vision -chest pain -confusion -dry cough -high blood pressure -mouth sores -pain, swelling, redness at site where injected -pain, tingling, numbness in the hands or feet -seizures -swelling of the ankles, feet, hands -trouble passing urine or change in the amount of urine Side effects that usually do not require medical attention (report to your doctor or health care professional if they continue or are bothersome): -diarrhea -green to blue color of urine -hair  loss -loss of appetite -nausea, vomiting This list may not describe all possible side effects. Call your doctor for medical advice about side effects. You may report side effects to FDA at 1-800-FDA-1088. Where should I keep my medicine? This drug is given in a hospital or clinic and will not be stored at home. NOTE: This sheet is a summary. It may not cover all possible information. If you have questions about this medicine, talk to your doctor, pharmacist, or health care provider.  2017 Elsevier/Gold Standard (2007-11-22 11:16:23) Fluorouracil, 5-FU injection What is this medicine? FLUOROURACIL, 5-FU (flure oh YOOR a sil) is a chemotherapy drug. It slows the growth of cancer cells. This medicine is used to treat many types of cancer like breast cancer, colon or rectal cancer, pancreatic cancer, and stomach cancer. This medicine may be used for other purposes; ask your health care provider or pharmacist if you have questions. COMMON BRAND NAME(S): Adrucil What should I tell my health care provider before I take this medicine? They need to know if you have any of these conditions: -blood disorders -dihydropyrimidine dehydrogenase (DPD) deficiency -infection (especially a virus infection such as chickenpox, cold sores, or herpes) -kidney disease -liver disease -malnourished, poor nutrition -recent or ongoing radiation therapy -an unusual or allergic reaction to fluorouracil, other chemotherapy, other medicines, foods, dyes, or preservatives -pregnant or trying to get pregnant -breast-feeding How should I use this medicine? This drug is given as an infusion or  injection into a vein. It is administered in a hospital or clinic by a specially trained health care professional. Talk to your pediatrician regarding the use of this medicine in children. Special care may be needed. Overdosage: If you think you have taken too much of this medicine contact a poison control center or emergency room at  once. NOTE: This medicine is only for you. Do not share this medicine with others. What if I miss a dose? It is important not to miss your dose. Call your doctor or health care professional if you are unable to keep an appointment. What may interact with this medicine? -allopurinol -cimetidine -dapsone -digoxin -hydroxyurea -leucovorin -levamisole -medicines for seizures like ethotoin, fosphenytoin, phenytoin -medicines to increase blood counts like filgrastim, pegfilgrastim, sargramostim -medicines that treat or prevent blood clots like warfarin, enoxaparin, and dalteparin -methotrexate -metronidazole -pyrimethamine -some other chemotherapy drugs like busulfan, cisplatin, estramustine, vinblastine -trimethoprim -trimetrexate -vaccines Talk to your doctor or health care professional before taking any of these medicines: -acetaminophen -aspirin -ibuprofen -ketoprofen -naproxen This list may not describe all possible interactions. Give your health care provider a list of all the medicines, herbs, non-prescription drugs, or dietary supplements you use. Also tell them if you smoke, drink alcohol, or use illegal drugs. Some items may interact with your medicine. What should I watch for while using this medicine? Visit your doctor for checks on your progress. This drug may make you feel generally unwell. This is not uncommon, as chemotherapy can affect healthy cells as well as cancer cells. Report any side effects. Continue your course of treatment even though you feel ill unless your doctor tells you to stop. In some cases, you may be given additional medicines to help with side effects. Follow all directions for their use. Call your doctor or health care professional for advice if you get a fever, chills or sore throat, or other symptoms of a cold or flu. Do not treat yourself. This drug decreases your body's ability to fight infections. Try to avoid being around people who are  sick. This medicine may increase your risk to bruise or bleed. Call your doctor or health care professional if you notice any unusual bleeding. Be careful brushing and flossing your teeth or using a toothpick because you may get an infection or bleed more easily. If you have any dental work done, tell your dentist you are receiving this medicine. Avoid taking products that contain aspirin, acetaminophen, ibuprofen, naproxen, or ketoprofen unless instructed by your doctor. These medicines may hide a fever. Do not become pregnant while taking this medicine. Women should inform their doctor if they wish to become pregnant or think they might be pregnant. There is a potential for serious side effects to an unborn child. Talk to your health care professional or pharmacist for more information. Do not breast-feed an infant while taking this medicine. Men should inform their doctor if they wish to father a child. This medicine may lower sperm counts. Do not treat diarrhea with over the counter products. Contact your doctor if you have diarrhea that lasts more than 2 days or if it is severe and watery. This medicine can make you more sensitive to the sun. Keep out of the sun. If you cannot avoid being in the sun, wear protective clothing and use sunscreen. Do not use sun lamps or tanning beds/booths. What side effects may I notice from receiving this medicine? Side effects that you should report to your doctor or health care professional as soon  as possible: -allergic reactions like skin rash, itching or hives, swelling of the face, lips, or tongue -low blood counts - this medicine may decrease the number of white blood cells, red blood cells and platelets. You may be at increased risk for infections and bleeding. -signs of infection - fever or chills, cough, sore throat, pain or difficulty passing urine -signs of decreased platelets or bleeding - bruising, pinpoint red spots on the skin, black, tarry stools,  blood in the urine -signs of decreased red blood cells - unusually weak or tired, fainting spells, lightheadedness -breathing problems -changes in vision -chest pain -mouth sores -nausea and vomiting -pain, swelling, redness at site where injected -pain, tingling, numbness in the hands or feet -redness, swelling, or sores on hands or feet -stomach pain -unusual bleeding Side effects that usually do not require medical attention (report to your doctor or health care professional if they continue or are bothersome): -changes in finger or toe nails -diarrhea -dry or itchy skin -hair loss -headache -loss of appetite -sensitivity of eyes to the light -stomach upset -unusually teary eyes This list may not describe all possible side effects. Call your doctor for medical advice about side effects. You may report side effects to FDA at 1-800-FDA-1088. Where should I keep my medicine? This drug is given in a hospital or clinic and will not be stored at home. NOTE: This sheet is a summary. It may not cover all possible information. If you have questions about this medicine, talk to your doctor, pharmacist, or health care provider.  2017 Elsevier/Gold Standard (2007-09-19 13:53:16)

## 2016-05-03 NOTE — Progress Notes (Signed)
Caitlin [name] is here after her 2nd fraction of radiation to her Pelvis. She denies pain or fatigue. She denies any symptoms related to radiation today. She started a 24 hours continuous infusion of chemotherapy today, and it will run till Saturday, and repeat one more time towards the end of radiation. She was provided a book explaining side effects she may have related to radiation today, and knows to let us know if she has any further questions.   BP 130/65   Pulse 85   Temp 98 F (36.7 C)   Ht 5\' 2"  (1.575 m)   Wt 172 lb 6.4 oz (78.2 kg)   SpO2 100% Comment: room air  BMI 31.53 kg/m    Wt Readings from Last 3 Encounters:  05/03/16 172 lb 6.4 oz (78.2 kg)  05/02/16 172 lb 9.6 oz (78.3 kg)  04/18/16 170 lb (77.1 kg)

## 2016-05-03 NOTE — Progress Notes (Signed)
Pt here for patient teaching.  Pt given Radiation and You booklet.  Reviewed areas of pertinence such as diarrhea, fatigue, skin changes and urinary and bladder changes . Pt able to give teach back of to pat skin, use unscented/gentle soap, have Imodium on hand and drink plenty of water,avoid applying anything to skin within 4 hours of treatment. Pt demonstrated understanding and verbalizes understanding of information given and will contact nursing with any questions or concerns.        

## 2016-05-03 NOTE — Progress Notes (Signed)
  Radiation Oncology         (336) [phone] ________________________________  Name: Caitlin [name] MRN: [id number]  Date: 05/03/2016  DOB: 1941-03-16  Weekly Radiation Therapy Management    ICD-9-CM ICD-10-CM   1. 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HCC) 188.9 C67.9      Current Dose: 4.4 Gy     Planned Dose:  55 Gy  Narrative . . . . . . . . The patient presents for routine under treatment assessment.  The patient has completed 2 fractions to her Pelvis. She denies pain or fatigue. The patient denies any symptoms related to radiation therapy. She started a 24 hour continuous infusion of chemotherapy today, and reports it will run until Saturday 05/07/16, and will be repeated once more towards the end of radiation.                                    The patient is without complaint.                                 Set-up films were reviewed.                                 The chart was checked. Physical Findings. . .  height is 5\' 2"  (1.575 m) and weight is 172 lb 6.4 oz (78.2 kg). Her temperature is 98 F (36.7 C). Her blood pressure is 130/65 and her pulse is 85. Her oxygen saturation is 100%. . Weight essentially stable.  No significant changes. Lungs are clear to auscultation bilaterally. Heart has regular rate and rhythm. No palpable cervical, supraclavicular, or axillary adenopathy. Abdomen soft, non-tender, normal bowel sounds. Impression . . . . . . . The patient is tolerating radiation. Plan . . . . . . . . . . . . Continue treatment as planned.  ________________________________   Blair Promise, PhD, MD  This document serves as a record of services personally performed by Gery Pray, MD. It was created on his behalf by Maryla Morrow, a trained medical scribe. The creation of this record is based on the scribe's personal observations and the provider's statements to them. This document has been checked and approved by the attending provider.

## 2016-05-04 ENCOUNTER — Ambulatory Visit
Admission: RE | Admit: 2016-05-04 | Discharge: 2016-05-04 | Disposition: A | Discharge status: Home / Self Care | Payer: Medicare Other | Source: Ambulatory Visit | Attending: Radiation Oncology | Admitting: Radiation Oncology

## 2016-05-04 ENCOUNTER — Other Ambulatory Visit: Payer: Medicare Other

## 2016-05-04 ENCOUNTER — Ambulatory Visit: Payer: Medicare Other | Admitting: Oncology

## 2016-05-04 DIAGNOSIS — Z510 Encounter for antineoplastic radiation therapy: Secondary | ICD-10-CM | POA: Diagnosis not present

## 2016-05-05 ENCOUNTER — Ambulatory Visit: Payer: Medicare Other

## 2016-05-05 ENCOUNTER — Ambulatory Visit
Admission: RE | Admit: 2016-05-05 | Discharge: 2016-05-05 | Disposition: A | Discharge status: Home / Self Care | Payer: Medicare Other | Source: Ambulatory Visit | Attending: Radiation Oncology | Admitting: Radiation Oncology

## 2016-05-05 DIAGNOSIS — Z452 Encounter for adjustment and management of vascular access device: Secondary | ICD-10-CM

## 2016-05-05 DIAGNOSIS — Z510 Encounter for antineoplastic radiation therapy: Secondary | ICD-10-CM | POA: Diagnosis not present

## 2016-05-05 MED ORDER — HEPARIN SOD (PORK) LOCK FLUSH 100 UNIT/ML IV SOLN
500.0000 [IU] | Freq: Once | INTRAVENOUS | Status: DC
  Filled 2016-05-05: qty 5

## 2016-05-05 MED ORDER — SODIUM CHLORIDE 0.9% FLUSH
10.0000 mL | INTRAVENOUS | Status: DC | PRN
  Filled 2016-05-05: qty 10

## 2016-05-06 ENCOUNTER — Ambulatory Visit
Admission: RE | Admit: 2016-05-06 | Discharge: 2016-05-06 | Disposition: A | Discharge status: Home / Self Care | Payer: Medicare Other | Source: Ambulatory Visit | Attending: Radiation Oncology | Admitting: Radiation Oncology

## 2016-05-06 DIAGNOSIS — Z510 Encounter for antineoplastic radiation therapy: Secondary | ICD-10-CM | POA: Diagnosis not present

## 2016-05-07 ENCOUNTER — Ambulatory Visit (HOSPITAL_BASED_OUTPATIENT_CLINIC_OR_DEPARTMENT_OTHER): Payer: Medicare Other

## 2016-05-07 VITALS — BP 122/53 | HR 76 | Temp 98.3°F | Resp 18

## 2016-05-07 DIAGNOSIS — C679 Malignant neoplasm of bladder, unspecified: Secondary | ICD-10-CM

## 2016-05-07 MED ORDER — SODIUM CHLORIDE 0.9% FLUSH
10.0000 mL | INTRAVENOUS | Status: DC | PRN
  Administered 2016-05-07: 10 mL
  Filled 2016-05-07: qty 10

## 2016-05-07 NOTE — Patient Instructions (Signed)
PICC Removal, Care After Refer to this sheet in the next few weeks. These instructions provide you with information on caring for yourself after your procedure. Your health care provider may also give you more specific instructions. Your treatment has been planned according to current medical practices, but problems sometimes occur. Call your health care provider if you have any problems or questions after your procedure. What can I expect after the procedure? After your procedure, it is typical to have mild discomfort at the insertion site. This should not last for more than a day. Follow these instructions at home: You may remove the bandage after 24 hours. The PICC insertion site is very small. A small scab may develop over the insertion site. It is okay to wash the site gently with soap and water. Be careful not to remove or pick off the scab. Gently pat the site dry after washing it. You do not need to put another bandage over the insertion site. Do not lift anything heavy or do strenuous physical activity for 24 hours after the PICC is removed. This includes:  Weight lifting.  Strenuous yard work.  Any physical activity with repetitive arm movement.  Contact a health care provider if:  You have swelling or puffiness in your arm at the PICC insertion site.  You have increasing tenderness at the PICC insertion site. Get help right away if:  You have numbness or tingling in your fingers, hand, or arm.  Your arm looks blue and feels cold.  You have redness around the insertion site or a red streak goes up your arm.  You have any type of drainage from the PICC insertion site. This includes drainage such as: ? Bleeding from the insertion site. If this happens, apply firm, direct pressure to the PICC insertion site with a clean towel. ? Drainage that is yellow or tan.  You have a fever. This information is not intended to replace advice given to you by your health care provider. Make  sure you discuss any questions you have with your health care provider. Document Released: 05/21/2013 Document Revised: 10/22/2015 Document Reviewed: 03/08/2013 Elsevier Interactive Patient Education  2017 [employer]vier Inc.  

## 2016-05-07 NOTE — Progress Notes (Signed)
Pt in for pump disconnect and PICC line DC. Denies any mouth sores. Reports 5 stools in past 24 hours. Pt reports her usual is 4. Side effect management reviewed including mucositis and diarrhea teaching. Pt instructed to drink minimum of 64oz/ day.  Teach back complete with patient and granddaughters.  R arm PICC removed, per Dr. Benay Spice. Manual pressure held for 5 minutes. Pressure dressing applied. Pt maintained flat lying position for 30 min. Pt instructed to keep dressing in place and dry for 24 hours. Written instructions given as well.

## 2016-05-09 ENCOUNTER — Ambulatory Visit
Admission: RE | Admit: 2016-05-09 | Discharge: 2016-05-09 | Disposition: A | Discharge status: Home / Self Care | Payer: Medicare Other | Source: Ambulatory Visit | Attending: Radiation Oncology | Admitting: Radiation Oncology

## 2016-05-09 DIAGNOSIS — Z510 Encounter for antineoplastic radiation therapy: Secondary | ICD-10-CM | POA: Diagnosis not present

## 2016-05-10 ENCOUNTER — Ambulatory Visit
Admission: RE | Admit: 2016-05-10 | Discharge: 2016-05-10 | Disposition: A | Discharge status: Home / Self Care | Payer: Medicare Other | Source: Ambulatory Visit | Attending: Radiation Oncology | Admitting: Radiation Oncology

## 2016-05-10 ENCOUNTER — Encounter: Payer: Self-pay | Admitting: Radiation Oncology

## 2016-05-10 VITALS — BP 127/66 | HR 74 | Temp 98.8°F | Ht 62.0 in | Wt 170.4 lb

## 2016-05-10 DIAGNOSIS — C679 Malignant neoplasm of bladder, unspecified: Secondary | ICD-10-CM

## 2016-05-10 DIAGNOSIS — Z510 Encounter for antineoplastic radiation therapy: Secondary | ICD-10-CM | POA: Diagnosis not present

## 2016-05-10 NOTE — Progress Notes (Signed)
[facility]         [phone]) [phone] ________________________________  Name: Caitlin [name] MRN: [id number]  Date: 05/10/2016  DOB: Oct 12, 1940  Weekly Radiation Therapy Management    ICD-9-CM ICD-10-CM   1. 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HCC) 188.9 C67.9      Current Dose: 15.4 Gy     Planned Dose:  55 Gy  Narrative . . . . . . . . The patient presents for routine under treatment assessment.  The patient has completed 7 fractions to her pelvis. She denies pain or fatigue. Denies nausea, vaginal or rectal bleeding, or difficulty urinating. She does report some vaginal itching and is wondering if she can soak in epsom salt. She reports one loose stool per day and is taking Imodium as needed. She had chemotherapy last week.                                   The patient is without complaint.                                 Set-up films were reviewed.                                 The chart was checked. Physical Findings. . .  height is 5\' 2"  (1.575 m) and weight is 170 lb 6.4 oz (77.3 kg). Her oral temperature is 98.8 F (37.1 C). Her blood pressure is 127/66 and her pulse is 74. Her oxygen saturation is 97%. . Weight essentially stable.  No significant changes. Lungs are clear to auscultation bilaterally. Heart has regular rate and rhythm. Abdomen soft, non-tender, normal bowel sounds. Impression . . . . . . . The patient is tolerating radiation. Plan . . . . . . . . . . . . Continue treatment as planned. I encouraged the patient to use a sitz bath to sooth some of her vaginal irritation. ________________________________   Blair Promise, PhD, MD  This document serves as a record of services personally performed by Gery Pray, MD. It was created on his behalf by Maryla Morrow, a trained medical scribe. The creation of this record is based on the scribe's personal observations and the provider's statements to them. This document has been checked and approved by the attending provider.

## 2016-05-10 NOTE — Progress Notes (Signed)
Caitlin [name] has completed 7 fractions to her pelvis.  She denies having any pain, nausea, vaginal/rectal bleeding or trouble urinating.  She does report having some vaginal itching and is wondering if she can soak in epsom salts.  She will be given a sitz bath.  She reports having one loose bowel movement per day and is taking Imodium as needed.  She had chemotherapy last week.  She denies having fatigue.  BP 127/66 (BP Location: Right Arm, Patient Position: Sitting)   Pulse 74   Temp 98.8 F (37.1 C) (Oral)   Ht 5\' 2"  (1.575 m)   Wt 170 lb 6.4 oz (77.3 kg)   SpO2 97%   BMI 31.17 kg/m    Wt Readings from Last 3 Encounters:  05/10/16 170 lb 6.4 oz (77.3 kg)  05/03/16 172 lb 6.4 oz (78.2 kg)  05/02/16 172 lb 9.6 oz (78.3 kg)

## 2016-05-11 ENCOUNTER — Other Ambulatory Visit (HOSPITAL_BASED_OUTPATIENT_CLINIC_OR_DEPARTMENT_OTHER): Payer: Medicare Other

## 2016-05-11 ENCOUNTER — Telehealth: Payer: Self-pay | Admitting: Oncology

## 2016-05-11 ENCOUNTER — Ambulatory Visit (HOSPITAL_BASED_OUTPATIENT_CLINIC_OR_DEPARTMENT_OTHER): Payer: Medicare Other | Admitting: Oncology

## 2016-05-11 ENCOUNTER — Ambulatory Visit
Admission: RE | Admit: 2016-05-11 | Discharge: 2016-05-11 | Disposition: A | Discharge status: Home / Self Care | Payer: Medicare Other | Source: Ambulatory Visit | Attending: Radiation Oncology | Admitting: Radiation Oncology

## 2016-05-11 VITALS — BP 145/62 | HR 84 | Temp 97.4°F | Resp 17 | Ht 62.0 in | Wt 170.9 lb

## 2016-05-11 DIAGNOSIS — Z510 Encounter for antineoplastic radiation therapy: Secondary | ICD-10-CM | POA: Diagnosis not present

## 2016-05-11 DIAGNOSIS — C679 Malignant neoplasm of bladder, unspecified: Secondary | ICD-10-CM

## 2016-05-11 DIAGNOSIS — C774 Secondary and unspecified malignant neoplasm of inguinal and lower limb lymph nodes: Secondary | ICD-10-CM

## 2016-05-11 DIAGNOSIS — I10 Essential (primary) hypertension: Secondary | ICD-10-CM

## 2016-05-11 DIAGNOSIS — K1230 Oral mucositis (ulcerative), unspecified: Secondary | ICD-10-CM

## 2016-05-11 LAB — CBC WITH DIFFERENTIAL/PLATELET
BASO%: 0.2 % (ref 0.0–2.0)
Basophils Absolute: 0 10*3/uL (ref 0.0–0.1)
EOS%: 2.7 % (ref 0.0–7.0)
Eosinophils Absolute: 0.1 10*3/uL (ref 0.0–0.5)
HCT: 33 % — ABNORMAL LOW (ref 34.8–46.6)
HEMOGLOBIN: 10.7 g/dL — AB (ref 11.6–15.9)
LYMPH%: 11.7 % — AB (ref 14.0–49.7)
MCH: 30.1 pg (ref 25.1–34.0)
MCHC: 32.4 g/dL (ref 31.5–36.0)
MCV: 92.8 fL (ref 79.5–101.0)
MONO#: 0.1 10*3/uL (ref 0.1–0.9)
MONO%: 1.7 % (ref 0.0–14.0)
NEUT%: 83.7 % — ABNORMAL HIGH (ref 38.4–76.8)
NEUTROS ABS: 2.7 10*3/uL (ref 1.5–6.5)
Platelets: 293 10*3/uL (ref 145–400)
RBC: 3.55 10*6/uL — AB (ref 3.70–5.45)
RDW: 13.8 % (ref 11.2–14.5)
WBC: 3.3 10*3/uL — AB (ref 3.9–10.3)
lymph#: 0.4 10*3/uL — ABNORMAL LOW (ref 0.9–3.3)

## 2016-05-11 LAB — BASIC METABOLIC PANEL
Anion Gap: 8 mEq/L (ref 3–11)
BUN: 24.4 mg/dL (ref 7.0–26.0)
CHLORIDE: 110 meq/L — AB (ref 98–109)
CO2: 21 mEq/L — ABNORMAL LOW (ref 22–29)
CREATININE: 1.2 mg/dL — AB (ref 0.6–1.1)
Calcium: 8.5 mg/dL (ref 8.4–10.4)
EGFR: 44 mL/min/{1.73_m2} — AB (ref >90)
Glucose: 107 mg/dl (ref 70–140)
Potassium: 3.8 mEq/L (ref 3.5–5.1)
SODIUM: 139 meq/L (ref 136–145)

## 2016-05-11 NOTE — Progress Notes (Signed)
  [place] OFFICE PROGRESS NOTE   Diagnosis: Bladder cancer  INTERVAL HISTORY:   Caitlin [name] returns as scheduled. She began radiation 05/02/2016. She completed a first cycle of 5-FU/mitomycin-C via a PICC beginning on 05/03/2016. No nausea/vomiting following chemotherapy. She developed sores at the lower lip beginning on day 7. She reports diarrhea twice daily for several days following chemotherapy. She otherwise feels well.  Objective:  Vital signs in last 24 hours:  Blood pressure (!) 145/62, pulse 84, temperature 97.4 F (36.3 C), temperature source Oral, resp. rate 17, height 5\' 2"  (1.575 m), weight 170 lb 14.4 oz (77.5 kg), SpO2 100 %.    HEENT: Small ulcers at the posterior pharynx, and lower outer lip. No thrush. Lymphatics: 3 cm firm node in the right inguinal region Resp: Lungs clear bilaterally Cardio: Regular rate and rhythm GI: No hepatomegaly, nontender Vascular: No leg edema  Skin: No erythema or skin breakdown at the groin or pubic/labial areas     Lab Results:  Lab Results  Component Value Date   WBC 3.3 (L) 05/11/2016   HGB 10.7 (L) 05/11/2016   HCT 33.0 (L) 05/11/2016   MCV 92.8 05/11/2016   PLT 293 05/11/2016   NEUTROABS 2.7 05/11/2016     Medications: I have reviewed the patient's current medications.  Assessment/Plan: 1. 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 Cystectomy, bilateral pelvic lymphadenectomy, and ileal neobladder 04/21/2008-no residual malignancy identified ? 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 Chest x-ray 01/29/2016-negative for metastatic disease ? CT abdomen/pelvis 02/02/2016-enlarged right inguinal lymph node with a borderline adjacent right common femoral  node ? Biopsyof right inguinal lymph node on 02/17/2016 confirming metastatic carcinoma, pathology review at [facility] found the inguinal lymph node biopsy to be consistent with metastatic carcinoma similar to the urethral mass. ? PET scan at [facility] on 03/31/2016 revealed right external iliac and inguinal hypermetabolic lymph nodes and hypermetabolic activity associated with a sclerotic lesion at C3. MRI 04/12/2016 consistent with degenerative changes at C3-C4 ? Initiation of radiation 05/02/2016 ? Cycle 1 5-FU/mitomycin C 05/03/2016  2. Breast cancer, left T1b, N0-2005, status post lumpectomy/radiation and Aromasin  3. Hypertension    Disposition:  Ms. Caitlin [name] is now at day 9 following cycle 1 5-FU/mitomycin-C. She has mild mucositis. She otherwise tolerated the chemotherapy well. She will continue topical treatment of the mouth/lip sores. These should improve over the next several days. She will contact us for a fever. Ms. Songco will be scheduled for an office visit 05/27/2016. We will check another CBC on 05/18/2016.  The next cycle of 5-FU/mitomycin-C and placement of a PICC will be scheduled for 05/31/2016.  Betsy Coder, MD  05/11/2016  11:12 AM

## 2016-05-11 NOTE — Telephone Encounter (Signed)
Gave patient avs report and appointments for December and January, including PICC placement appointments for 1/2 @ 12:30 pm - after xrt and prior to chemo.

## 2016-05-12 ENCOUNTER — Ambulatory Visit
Admission: RE | Admit: 2016-05-12 | Discharge: 2016-05-12 | Disposition: A | Discharge status: Home / Self Care | Payer: Medicare Other | Source: Ambulatory Visit | Attending: Radiation Oncology | Admitting: Radiation Oncology

## 2016-05-12 DIAGNOSIS — Z510 Encounter for antineoplastic radiation therapy: Secondary | ICD-10-CM | POA: Diagnosis not present

## 2016-05-13 ENCOUNTER — Ambulatory Visit
Admission: RE | Admit: 2016-05-13 | Discharge: 2016-05-13 | Disposition: A | Discharge status: Home / Self Care | Payer: Medicare Other | Source: Ambulatory Visit | Attending: Radiation Oncology | Admitting: Radiation Oncology

## 2016-05-13 DIAGNOSIS — Z510 Encounter for antineoplastic radiation therapy: Secondary | ICD-10-CM | POA: Diagnosis not present

## 2016-05-16 ENCOUNTER — Ambulatory Visit
Admission: RE | Admit: 2016-05-16 | Discharge: 2016-05-16 | Disposition: A | Discharge status: Home / Self Care | Payer: Medicare Other | Source: Ambulatory Visit | Attending: Radiation Oncology | Admitting: Radiation Oncology

## 2016-05-16 DIAGNOSIS — Z510 Encounter for antineoplastic radiation therapy: Secondary | ICD-10-CM | POA: Diagnosis not present

## 2016-05-17 ENCOUNTER — Ambulatory Visit
Admission: RE | Admit: 2016-05-17 | Discharge: 2016-05-17 | Disposition: A | Discharge status: Home / Self Care | Payer: Medicare Other | Source: Ambulatory Visit | Attending: Radiation Oncology | Admitting: Radiation Oncology

## 2016-05-17 ENCOUNTER — Encounter: Payer: Self-pay | Admitting: Radiation Oncology

## 2016-05-17 VITALS — BP 126/66 | HR 70 | Temp 98.2°F | Ht 62.0 in | Wt 170.6 lb

## 2016-05-17 DIAGNOSIS — C679 Malignant neoplasm of bladder, unspecified: Secondary | ICD-10-CM

## 2016-05-17 DIAGNOSIS — Z510 Encounter for antineoplastic radiation therapy: Secondary | ICD-10-CM | POA: Diagnosis not present

## 2016-05-17 MED ORDER — FLUCONAZOLE 150 MG PO TABS: 150.0000 mg | ORAL_TABLET | Freq: Every day | ORAL | 0 refills | Status: DC

## 2016-05-17 NOTE — Progress Notes (Signed)
[facility]         [phone]) [phone] ________________________________  Name: Caitlin [name] MRN: [id number]  Date: 05/17/2016  DOB: August 04, 1940  Weekly Radiation Therapy Management    ICD-9-CM ICD-10-CM   1. 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HCC) 188.9 C67.9      Current Dose: 26 Gy     Planned Dose:  55 Gy  Narrative . . . . . . . . The patient presents for routine under treatment assessment.  Caitlin [name] has completed 12 fractions to her pelvis.  She reports having skin irritation in her vaginal area.  Feels more skin irritation on the right side.  She is using a sitz bath and was applying Vaseline to the area. The nurse advised her to not apply Vaseline.  She also reports having diarrhea with cramping and is taking 2 Imodium per day.  She denies having nausea or trouble urinating.  She reports having mild fatigue.                                    The patient is without complaint.                                 Set-up films were reviewed.                                 The chart was checked. Physical Findings. . .  height is 5\' 2"  (1.575 m) and weight is 170 lb 9.6 oz (77.4 kg). Her oral temperature is 98.2 F (36.8 C). Her blood pressure is 126/66 and her pulse is 70. Her oxygen saturation is 100%. . Weight essentially stable.  No significant changes. Lungs are clear to auscultation bilaterally. Heart has regular rate and rhythm. Abdomen soft, non-tender, normal bowel sounds. Impression . . . . . . . The patient is tolerating radiation. Plan . . . . . . . . . . . . Continue treatment as planned. I encouraged the patient to use baby wipes to wipe after having a bowel movement. I will prescribe Diflucan to help with her vaginal irritation. This may be a yeast infection. 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  ________________________________   Blair Promise, PhD, MD  This document serves as a record of services personally performed by Gery Pray, MD. It was created on  his behalf by Darcus Austin, a trained medical scribe. The creation of this record is based on the scribe's personal observations and the provider's statements to them. This document has been checked and approved by the attending provider.

## 2016-05-17 NOTE — Progress Notes (Signed)
Caitlin [name] has completed 12 fractions to her pelvis.  She reports having skin irritation in her vaginal area.  She is using a sitz bath and was applying Vaseline to the area.  Advised her to not apply Vaseline.  She also reports having diarrhea with cramping and is taking 2 Imodium per day.  She denies having nausea or trouble urinating.  She reports having mild fatigue.  BP 126/66 (BP Location: Left Arm, Patient Position: Sitting)   Pulse 70   Temp 98.2 F (36.8 C) (Oral)   Ht 5\' 2"  (1.575 m)   Wt 170 lb 9.6 oz (77.4 kg)   SpO2 100%   BMI 31.20 kg/m    Wt Readings from Last 3 Encounters:  05/17/16 170 lb 9.6 oz (77.4 kg)  05/11/16 170 lb 14.4 oz (77.5 kg)  05/10/16 170 lb 6.4 oz (77.3 kg)

## 2016-05-18 ENCOUNTER — Other Ambulatory Visit (HOSPITAL_BASED_OUTPATIENT_CLINIC_OR_DEPARTMENT_OTHER): Payer: Medicare Other

## 2016-05-18 ENCOUNTER — Ambulatory Visit
Admission: RE | Admit: 2016-05-18 | Discharge: 2016-05-18 | Disposition: A | Discharge status: Home / Self Care | Payer: Medicare Other | Source: Ambulatory Visit | Attending: Radiation Oncology | Admitting: Radiation Oncology

## 2016-05-18 DIAGNOSIS — C774 Secondary and unspecified malignant neoplasm of inguinal and lower limb lymph nodes: Secondary | ICD-10-CM | POA: Diagnosis not present

## 2016-05-18 DIAGNOSIS — Z510 Encounter for antineoplastic radiation therapy: Secondary | ICD-10-CM | POA: Diagnosis not present

## 2016-05-18 DIAGNOSIS — C679 Malignant neoplasm of bladder, unspecified: Secondary | ICD-10-CM

## 2016-05-18 LAB — CBC WITH DIFFERENTIAL/PLATELET
BASO%: 0.3 % (ref 0.0–2.0)
Basophils Absolute: 0 10*3/uL (ref 0.0–0.1)
EOS%: 3.9 % (ref 0.0–7.0)
Eosinophils Absolute: 0.1 10*3/uL (ref 0.0–0.5)
HCT: 32.3 % — ABNORMAL LOW (ref 34.8–46.6)
HGB: 10.3 g/dL — ABNORMAL LOW (ref 11.6–15.9)
LYMPH%: 16.3 % (ref 14.0–49.7)
MCH: 30.1 pg (ref 25.1–34.0)
MCHC: 31.9 g/dL (ref 31.5–36.0)
MCV: 94.4 fL (ref 79.5–101.0)
MONO#: 0.4 10*3/uL (ref 0.1–0.9)
MONO%: 13 % (ref 0.0–14.0)
NEUT%: 66.5 % (ref 38.4–76.8)
NEUTROS ABS: 2 10*3/uL (ref 1.5–6.5)
Platelets: 201 10*3/uL (ref 145–400)
RBC: 3.42 10*6/uL — AB (ref 3.70–5.45)
RDW: 14.7 % — ABNORMAL HIGH (ref 11.2–14.5)
WBC: 3.1 10*3/uL — AB (ref 3.9–10.3)
lymph#: 0.5 10*3/uL — ABNORMAL LOW (ref 0.9–3.3)

## 2016-05-19 ENCOUNTER — Ambulatory Visit
Admission: RE | Admit: 2016-05-19 | Discharge: 2016-05-19 | Disposition: A | Discharge status: Home / Self Care | Payer: Medicare Other | Source: Ambulatory Visit | Attending: Radiation Oncology | Admitting: Radiation Oncology

## 2016-05-19 DIAGNOSIS — Z510 Encounter for antineoplastic radiation therapy: Secondary | ICD-10-CM | POA: Diagnosis not present

## 2016-05-20 ENCOUNTER — Ambulatory Visit
Admission: RE | Admit: 2016-05-20 | Discharge: 2016-05-20 | Disposition: A | Discharge status: Home / Self Care | Payer: Medicare Other | Source: Ambulatory Visit | Attending: Radiation Oncology | Admitting: Radiation Oncology

## 2016-05-20 DIAGNOSIS — Z510 Encounter for antineoplastic radiation therapy: Secondary | ICD-10-CM | POA: Diagnosis not present

## 2016-05-24 ENCOUNTER — Ambulatory Visit
Admission: RE | Admit: 2016-05-24 | Discharge: 2016-05-24 | Disposition: A | Discharge status: Home / Self Care | Payer: Medicare Other | Source: Ambulatory Visit | Attending: Radiation Oncology | Admitting: Radiation Oncology

## 2016-05-24 ENCOUNTER — Encounter: Payer: Self-pay | Admitting: Radiation Oncology

## 2016-05-24 VITALS — BP 115/52 | HR 72 | Temp 97.9°F | Resp 18 | Wt 169.2 lb

## 2016-05-24 DIAGNOSIS — C679 Malignant neoplasm of bladder, unspecified: Secondary | ICD-10-CM

## 2016-05-24 DIAGNOSIS — Z510 Encounter for antineoplastic radiation therapy: Secondary | ICD-10-CM | POA: Diagnosis not present

## 2016-05-24 NOTE — Progress Notes (Signed)
   Weekly Management Note:  Outpatient    ICD-9-CM ICD-10-CM   1. 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HCC) 188.9 C67.9     Current Dose:  35.2 Gy  Projected Dose: 55 Gy   Narrative:  The patient presents for routine under treatment assessment.  CBCT/MVCT images/Port film x-rays were reviewed.  The chart was checked.   Weight and vitals stable. Overall, she is pleased with her progress. Reports perineal pain related to edema. Reports vaginal itching is slightly improved since taking Diflucan. Reports scant white vaginal discharge. Reports diarrhea. Reports she uses her sitz bath twice per day and does obtain some relief. Also, reports taking two Imodium tablets per day. Denies blood in stool. Reports she continues to self catheterize every 2 hours. Reports urine output is clear without odor. Reports mild fatigue.   Physical Findings:  weight is 169 lb 3.2 oz (76.7 kg). Her oral temperature is 97.9 F (36.6 C). Her blood pressure is 115/52 (abnormal) and her pulse is 72. Her respiration is 18 and oxygen saturation is 100%.   Wt Readings from Last 3 Encounters:  05/24/16 169 lb 3.2 oz (76.7 kg)  05/17/16 170 lb 9.6 oz (77.4 kg)  05/11/16 170 lb 14.4 oz (77.5 kg)   Alert and in no acute distress. Well appearing  Impression:  The patient is tolerating radiotherapy.  Plan:  Continue radiotherapy as planned. Encouraged the patient to continue regular sitz baths. Discussed using a desk fan to blow cool dry air on her perineum to soothe it and keep it dry.  ________________________________   Eppie Gibson, M.D.   This document serves as a record of services personally performed by Eppie Gibson, MD. It was created on her behalf by Arlyce Harman, a trained medical scribe. The creation of this record is based on the scribe's personal observations and the provider's statements to them. This document has been checked and approved by the attending provider.

## 2016-05-24 NOTE — Progress Notes (Signed)
Weight and vitals stable. Reports perineal pain related to edema. Reports vaginal itching is slightly improved since taking Diflucan. Reports scant white vaginal discharge. Reports diarrhea. Reports she uses her sitz bath twice per day and does obtain some relief. Also, reports taking two Imodium tablets per day. Denies blood in stool. Reports she continues to self catheterize every 2 hours. Reports urine output is clear without odor. Reports mild fatigue.   BP (!) 115/52 (BP Location: Right Arm, Patient Position: Sitting, Cuff Size: Normal)   Pulse 72   Temp 97.9 F (36.6 C) (Oral)   Resp 18   Wt 169 lb 3.2 oz (76.7 kg)   SpO2 100%   BMI 30.95 kg/m  Wt Readings from Last 3 Encounters:  05/24/16 169 lb 3.2 oz (76.7 kg)  05/17/16 170 lb 9.6 oz (77.4 kg)  05/11/16 170 lb 14.4 oz (77.5 kg)

## 2016-05-25 ENCOUNTER — Ambulatory Visit
Admission: RE | Admit: 2016-05-25 | Discharge: 2016-05-25 | Disposition: A | Discharge status: Home / Self Care | Payer: Medicare Other | Source: Ambulatory Visit | Attending: Radiation Oncology | Admitting: Radiation Oncology

## 2016-05-25 DIAGNOSIS — Z510 Encounter for antineoplastic radiation therapy: Secondary | ICD-10-CM | POA: Diagnosis not present

## 2016-05-26 ENCOUNTER — Telehealth: Payer: Self-pay | Admitting: Medical Oncology

## 2016-05-26 ENCOUNTER — Ambulatory Visit
Admission: RE | Admit: 2016-05-26 | Discharge: 2016-05-26 | Disposition: A | Discharge status: Home / Self Care | Payer: Medicare Other | Source: Ambulatory Visit | Attending: Radiation Oncology | Admitting: Radiation Oncology

## 2016-05-26 DIAGNOSIS — Z510 Encounter for antineoplastic radiation therapy: Secondary | ICD-10-CM | POA: Diagnosis not present

## 2016-05-26 NOTE — Telephone Encounter (Signed)
err

## 2016-05-27 ENCOUNTER — Ambulatory Visit (HOSPITAL_BASED_OUTPATIENT_CLINIC_OR_DEPARTMENT_OTHER): Payer: Medicare Other | Admitting: Oncology

## 2016-05-27 ENCOUNTER — Other Ambulatory Visit: Payer: Self-pay | Admitting: General Surgery

## 2016-05-27 ENCOUNTER — Telehealth: Payer: Self-pay | Admitting: Oncology

## 2016-05-27 ENCOUNTER — Ambulatory Visit
Admission: RE | Admit: 2016-05-27 | Discharge: 2016-05-27 | Disposition: A | Discharge status: Home / Self Care | Payer: Medicare Other | Source: Ambulatory Visit | Attending: Radiation Oncology | Admitting: Radiation Oncology

## 2016-05-27 ENCOUNTER — Encounter: Payer: Self-pay | Admitting: Oncology

## 2016-05-27 ENCOUNTER — Other Ambulatory Visit (HOSPITAL_BASED_OUTPATIENT_CLINIC_OR_DEPARTMENT_OTHER): Payer: Medicare Other

## 2016-05-27 VITALS — BP 125/52 | HR 93 | Temp 98.1°F | Resp 17 | Ht 62.0 in | Wt 166.9 lb

## 2016-05-27 DIAGNOSIS — C774 Secondary and unspecified malignant neoplasm of inguinal and lower limb lymph nodes: Secondary | ICD-10-CM

## 2016-05-27 DIAGNOSIS — C679 Malignant neoplasm of bladder, unspecified: Secondary | ICD-10-CM

## 2016-05-27 DIAGNOSIS — R197 Diarrhea, unspecified: Secondary | ICD-10-CM

## 2016-05-27 DIAGNOSIS — R110 Nausea: Secondary | ICD-10-CM | POA: Diagnosis not present

## 2016-05-27 DIAGNOSIS — I10 Essential (primary) hypertension: Secondary | ICD-10-CM

## 2016-05-27 DIAGNOSIS — Z853 Personal history of malignant neoplasm of breast: Secondary | ICD-10-CM

## 2016-05-27 DIAGNOSIS — Z510 Encounter for antineoplastic radiation therapy: Secondary | ICD-10-CM | POA: Diagnosis not present

## 2016-05-27 LAB — CBC WITH DIFFERENTIAL/PLATELET
BASO%: 0.4 % (ref 0.0–2.0)
Basophils Absolute: 0 10*3/uL (ref 0.0–0.1)
EOS%: 1.5 % (ref 0.0–7.0)
Eosinophils Absolute: 0.1 10*3/uL (ref 0.0–0.5)
HEMATOCRIT: 34 % — AB (ref 34.8–46.6)
HGB: 11.3 g/dL — ABNORMAL LOW (ref 11.6–15.9)
LYMPH#: 0.4 10*3/uL — AB (ref 0.9–3.3)
LYMPH%: 5.2 % — ABNORMAL LOW (ref 14.0–49.7)
MCH: 30.9 pg (ref 25.1–34.0)
MCHC: 33.1 g/dL (ref 31.5–36.0)
MCV: 93.4 fL (ref 79.5–101.0)
MONO#: 0.6 10*3/uL (ref 0.1–0.9)
MONO%: 8.7 % (ref 0.0–14.0)
NEUT%: 84.2 % — ABNORMAL HIGH (ref 38.4–76.8)
NEUTROS ABS: 5.8 10*3/uL (ref 1.5–6.5)
PLATELETS: 464 10*3/uL — AB (ref 145–400)
RBC: 3.64 10*6/uL — AB (ref 3.70–5.45)
RDW: 15 % — ABNORMAL HIGH (ref 11.2–14.5)
WBC: 6.9 10*3/uL (ref 3.9–10.3)

## 2016-05-27 LAB — COMPREHENSIVE METABOLIC PANEL
ALT: 29 U/L (ref 0–55)
ANION GAP: 10 meq/L (ref 3–11)
AST: 29 U/L (ref 5–34)
Albumin: 3.2 g/dL — ABNORMAL LOW (ref 3.5–5.0)
Alkaline Phosphatase: 66 U/L (ref 40–150)
BILIRUBIN TOTAL: 0.31 mg/dL (ref 0.20–1.20)
BUN: 16.9 mg/dL (ref 7.0–26.0)
CALCIUM: 9.5 mg/dL (ref 8.4–10.4)
CO2: 24 meq/L (ref 22–29)
CREATININE: 1.3 mg/dL — AB (ref 0.6–1.1)
Chloride: 105 mEq/L (ref 98–109)
EGFR: 40 mL/min/{1.73_m2} — ABNORMAL LOW (ref >90)
Glucose: 89 mg/dl (ref 70–140)
Potassium: 4.4 mEq/L (ref 3.5–5.1)
Sodium: 139 mEq/L (ref 136–145)
TOTAL PROTEIN: 7.1 g/dL (ref 6.4–8.3)

## 2016-05-27 NOTE — Progress Notes (Signed)
  [place] OFFICE PROGRESS NOTE   Diagnosis: Bladder cancer  INTERVAL HISTORY:   Ms. Caitlin [name] returns as scheduled. She began radiation 05/02/2016. She completed a first cycle of 5-FU/mitomycin-C via a PICC beginning on 05/03/2016. He has mild nausea and uses antiemetics for this. No vomiting. Mouth sores have resolved. She reports diarrhea Velva Harman 4 times a day. His Imodium which helps. She otherwise feels well.  Objective:  Vital signs in last 24 hours:  Blood pressure (!) 125/52, pulse 93, temperature 98.1 F (36.7 C), temperature source Oral, resp. rate 17, height 5\' 2"  (1.575 m), weight 166 lb 14.4 oz (75.7 kg), SpO2 99 %.    HEENT: Small ulcers at the posterior pharynx, and lower outer lip. No thrush. Lymphatics: 1 cm firm node in the right inguinal region Resp: Lungs clear bilaterally Cardio: Regular rate and rhythm GI: No hepatomegaly, nontender Vascular: No leg edema  Skin: No erythema or skin breakdown at the groin or pubic/labial areas     Lab Results:  Lab Results  Component Value Date   WBC 6.9 05/27/2016   HGB 11.3 (L) 05/27/2016   HCT 34.0 (L) 05/27/2016   MCV 93.4 05/27/2016   PLT 464 (H) 05/27/2016   NEUTROABS 5.8 05/27/2016     Medications: I have reviewed the patient's current medications.  Assessment/Plan: 1. 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 Cystectomy, bilateral pelvic lymphadenectomy, and ileal neobladder 04/21/2008-no residual malignancy identified ? 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 Chest x-ray 01/29/2016-negative for metastatic disease ? CT abdomen/pelvis 02/02/2016-enlarged right inguinal lymph node with a borderline adjacent right common femoral node ? Biopsyof right  inguinal lymph node on 02/17/2016 confirming metastatic carcinoma, pathology review at [facility] found the inguinal lymph node biopsy to be consistent with metastatic carcinoma similar to the urethral mass. ? PET scan at [facility] on 03/31/2016 revealed right external iliac and inguinal hypermetabolic lymph nodes and hypermetabolic activity associated with a sclerotic lesion at C3. MRI 04/12/2016 consistent with degenerative changes at C3-C4 ? Initiation of radiation 05/02/2016 ? Cycle 1 5-FU/mitomycin C 05/03/2016  2. Breast cancer, left T1b, N0-2005, status post lumpectomy/radiation and Aromasin  3. Hypertension    Disposition:  Ms. [name] is doing well with her radiation and tolerated her first cycle chemotherapy well. She is scheduled to begin a second cycle of 5-FU/mitomycin-C on 05/31/2016. PICC will be placed that morning.  Ms. [name] will be scheduled for a follow-up visit around 06/09/2016. We will recheck a CBC on that date.   Mikey Bussing, NP  05/27/2016  12:44 PM

## 2016-05-27 NOTE — Telephone Encounter (Signed)
Appointments scheduled per 12/29 LOS. Patient given AVS report and calendars with future scheduled appointments. °

## 2016-05-30 ENCOUNTER — Other Ambulatory Visit: Payer: Self-pay | Admitting: Oncology

## 2016-05-31 ENCOUNTER — Ambulatory Visit
Admission: RE | Admit: 2016-05-31 | Discharge: 2016-05-31 | Disposition: A | Discharge status: Home / Self Care | Payer: Medicare Other | Source: Ambulatory Visit | Attending: Radiation Oncology | Admitting: Radiation Oncology

## 2016-05-31 ENCOUNTER — Ambulatory Visit (HOSPITAL_BASED_OUTPATIENT_CLINIC_OR_DEPARTMENT_OTHER): Payer: Medicare Other

## 2016-05-31 ENCOUNTER — Other Ambulatory Visit: Payer: Self-pay | Admitting: Oncology

## 2016-05-31 ENCOUNTER — Ambulatory Visit (HOSPITAL_COMMUNITY)
Admission: RE | Admit: 2016-05-31 | Discharge: 2016-05-31 | Disposition: A | Discharge status: Home / Self Care | Payer: Medicare Other | Source: Ambulatory Visit | Attending: Oncology | Admitting: Oncology

## 2016-05-31 ENCOUNTER — Encounter: Payer: Self-pay | Admitting: Radiation Oncology

## 2016-05-31 VITALS — BP 126/58 | HR 78 | Temp 98.2°F | Resp 18

## 2016-05-31 VITALS — BP 125/64 | HR 83 | Temp 98.1°F | Ht 62.0 in | Wt 166.2 lb

## 2016-05-31 DIAGNOSIS — C774 Secondary and unspecified malignant neoplasm of inguinal and lower limb lymph nodes: Secondary | ICD-10-CM

## 2016-05-31 DIAGNOSIS — C679 Malignant neoplasm of bladder, unspecified: Secondary | ICD-10-CM | POA: Diagnosis not present

## 2016-05-31 DIAGNOSIS — Z510 Encounter for antineoplastic radiation therapy: Secondary | ICD-10-CM | POA: Diagnosis not present

## 2016-05-31 DIAGNOSIS — Z5111 Encounter for antineoplastic chemotherapy: Secondary | ICD-10-CM

## 2016-05-31 HISTORY — PX: IR GENERIC HISTORICAL: IMG1180011

## 2016-05-31 MED ORDER — PROCHLORPERAZINE MALEATE 10 MG PO TABS
ORAL_TABLET | ORAL | Status: AC
  Filled 2016-05-31: qty 1

## 2016-05-31 MED ORDER — SODIUM CHLORIDE 0.9 % IV SOLN
800.0000 mg/m2/d | INTRAVENOUS | Status: DC
  Administered 2016-05-31: 5900 mg via INTRAVENOUS
  Filled 2016-05-31: qty 118

## 2016-05-31 MED ORDER — LIDOCAINE HCL 1 % IJ SOLN
INTRAMUSCULAR | Status: AC
  Filled 2016-05-31: qty 20

## 2016-05-31 MED ORDER — SODIUM CHLORIDE 0.9 % IV SOLN
Freq: Once | INTRAVENOUS | Status: AC
  Administered 2016-05-31: 15:00:00 via INTRAVENOUS

## 2016-05-31 MED ORDER — PROCHLORPERAZINE MALEATE 10 MG PO TABS
10.0000 mg | ORAL_TABLET | Freq: Once | ORAL | Status: AC
  Administered 2016-05-31: 10 mg via ORAL

## 2016-05-31 MED ORDER — MITOMYCIN CHEMO IV INJECTION 20 MG
15.0000 mg | Freq: Once | INTRAVENOUS | Status: AC
  Administered 2016-05-31: 15 mg via INTRAVENOUS
  Filled 2016-05-31: qty 30

## 2016-05-31 MED ORDER — LIDOCAINE HCL 1 % IJ SOLN
INTRAMUSCULAR | Status: DC | PRN
  Administered 2016-05-31: 5 mL via INTRADERMAL

## 2016-05-31 NOTE — Progress Notes (Signed)
Caitlin [name] presents for her 20th fraction of radiation to her Pelvis. She denies pain. She does report nausea at times and takes a compazine with relief. She reports vaginal itching and itching to her groin area. She also reports 3-4 diarrhea stools daily. She will take imodium at times with some relief. She denies urinary problems or concerns. She is eating well.   BP 125/64   Pulse 83   Temp 98.1 F (36.7 C)   Ht 5\' 2"  (1.575 m)   Wt 166 lb 3.2 oz (75.4 kg)   SpO2 98% Comment: room air  BMI 30.40 kg/m    Wt Readings from Last 3 Encounters:  05/31/16 166 lb 3.2 oz (75.4 kg)  05/27/16 166 lb 14.4 oz (75.7 kg)  05/24/16 169 lb 3.2 oz (76.7 kg)

## 2016-05-31 NOTE — Progress Notes (Signed)
[place]         [phone]) [phone] ________________________________  Name: Caitlin [name] MRN: [id number]  Date: 05/31/2016  DOB: 07-20-1940  Weekly Radiation Therapy Management    ICD-9-CM ICD-10-CM   1. 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[place]) 188.9 C67.9      Current Dose: 44 Gy     Planned Dose:  55 Gy  Narrative . . . . . . . . The patient presents for routine under treatment assessment.  Caitlin [name] presents for her 20th fraction of radiation to her pelvis. She denies pain. She does report nausea at times and takes a compazine with relief. She reports vaginal itching and itching to her groin area. She reports Diflucan has helped. She also reports 3-4 diarrhea stools daily. She will take imodium at times with some relief. She denies hematuria or other urinary problems or concerns. She is eating well. Reports some fatigue and has cut back a little on her activities and sleep during the afternoon and sleeps earlier at night.                                  Set-up films were reviewed.                                 The chart was checked. Physical Findings. . .  height is 5\' 2"  (1.575 m) and weight is 166 lb 3.2 oz (75.4 kg). Her temperature is 98.1 F (36.7 C). Her blood pressure is 125/64 and her pulse is 83. Her oxygen saturation is 98%. . Weight essentially stable. Lungs are clear to auscultation bilaterally. Heart has regular rate and rhythm. Abdomen soft, non-tender, normal bowel sounds. Impression . . . . . . . The patient is tolerating radiation. Plan . . . . . . . . . . . . Continue treatment as planned.  ________________________________   Blair Promise, PhD, MD  This document serves as a record of services personally performed by Gery Pray, MD. It was created on his behalf by Darcus Austin, a trained medical scribe. The creation of this record is based on the scribe's personal observations and the provider's statements to them. This document has been checked and approved by the attending  provider.

## 2016-05-31 NOTE — Patient Instructions (Signed)
[place] Discharge Instructions for Patients Receiving Chemotherapy  Today you received the following chemotherapy agents:  5FU and Mitomycin  To help prevent nausea and vomiting after your treatment, we encourage you to take your nausea medication as ordered per MD.   If you develop nausea and vomiting that is not controlled by your nausea medication, call the clinic.   BELOW ARE SYMPTOMS THAT SHOULD BE REPORTED IMMEDIATELY:  *FEVER GREATER THAN 100.5 F  *CHILLS WITH OR WITHOUT FEVER  NAUSEA AND VOMITING THAT IS NOT CONTROLLED WITH YOUR NAUSEA MEDICATION  *UNUSUAL SHORTNESS OF BREATH  *UNUSUAL BRUISING OR BLEEDING  TENDERNESS IN MOUTH AND THROAT WITH OR WITHOUT PRESENCE OF ULCERS  *URINARY PROBLEMS  *BOWEL PROBLEMS  UNUSUAL RASH Items with * indicate a potential emergency and should be followed up as soon as possible.  Feel free to call the clinic you have any questions or concerns. The clinic phone number is [phone].  Please show the [place] at check-in to the Emergency Department and triage nurse.

## 2016-05-31 NOTE — Procedures (Signed)
Successful placement of a single lumen PICC line in left brachial vein 37cm in length at atriocaval junction No immediate complications  Caitlin [name] E

## 2016-05-31 NOTE — Progress Notes (Signed)
OK to stop 5FU pump at 12:30PM with PICC d/c on Saturday, 06/04/16 per Dr. Benay Spice.  Patient notified and verbalizes an understanding of MD instructions.

## 2016-06-01 ENCOUNTER — Other Ambulatory Visit: Payer: Self-pay | Admitting: *Deleted

## 2016-06-01 ENCOUNTER — Ambulatory Visit
Admission: RE | Admit: 2016-06-01 | Discharge: 2016-06-01 | Disposition: A | Discharge status: Home / Self Care | Payer: Medicare Other | Source: Ambulatory Visit | Attending: Radiation Oncology | Admitting: Radiation Oncology

## 2016-06-01 DIAGNOSIS — Z510 Encounter for antineoplastic radiation therapy: Secondary | ICD-10-CM | POA: Diagnosis not present

## 2016-06-02 ENCOUNTER — Ambulatory Visit
Admission: RE | Admit: 2016-06-02 | Discharge: 2016-06-02 | Disposition: A | Discharge status: Home / Self Care | Payer: Medicare Other | Source: Ambulatory Visit | Attending: Radiation Oncology | Admitting: Radiation Oncology

## 2016-06-02 DIAGNOSIS — Z510 Encounter for antineoplastic radiation therapy: Secondary | ICD-10-CM | POA: Diagnosis not present

## 2016-06-03 ENCOUNTER — Encounter: Payer: Self-pay | Admitting: Radiation Oncology

## 2016-06-03 ENCOUNTER — Ambulatory Visit
Admission: RE | Admit: 2016-06-03 | Discharge: 2016-06-03 | Disposition: A | Discharge status: Home / Self Care | Payer: Medicare Other | Source: Ambulatory Visit | Attending: Radiation Oncology | Admitting: Radiation Oncology

## 2016-06-03 VITALS — BP 128/67 | HR 81 | Temp 98.1°F | Ht 62.0 in | Wt 167.0 lb

## 2016-06-03 DIAGNOSIS — Z510 Encounter for antineoplastic radiation therapy: Secondary | ICD-10-CM | POA: Diagnosis not present

## 2016-06-03 DIAGNOSIS — C679 Malignant neoplasm of bladder, unspecified: Secondary | ICD-10-CM

## 2016-06-03 MED ORDER — BIAFINE EX EMUL
Freq: Once | CUTANEOUS | Status: AC
  Administered 2016-06-03: 13:00:00 via TOPICAL

## 2016-06-03 NOTE — Progress Notes (Signed)
[facility]         [phone]) [phone] ________________________________  Name: Caitlin [name] MRN: [id number]  Date: 06/03/2016  DOB: 03/17/1941  Weekly Radiation Therapy Management    ICD-9-CM ICD-10-CM   1. 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[place]) 188.9 C67.9 topical emolient (BIAFINE) emulsion     Current Dose: 50.6 Gy     Planned Dose:  55 Gy  Narrative . . . . . . . . The patient presents for routine under treatment assessment.  Caitlin [name] presents for her 23rd fraction of radiation to her pelvis. She reports skin irritation and burning in her groin area today. She denies using anything on the area. Per nursing, the skin on her groin is red with a small area of peeling in her right groin. She also reports 2 loose stools per day with occasional incontinence which causes skin burning. She is taking Imodium and is using a sitz bath. She currently has chemotherapy infusing.                                  Set-up films were reviewed.                                 The chart was checked. Physical Findings. . .  height is 5\' 2"  (1.575 m) and weight is 167 lb (75.8 kg). Her oral temperature is 98.1 F (36.7 C). Her blood pressure is 128/67 and her pulse is 81. Her oxygen saturation is 100%. . Weight essentially stable. Lungs are clear to auscultation bilaterally. Heart has regular rate and rhythm. Abdomen soft, non-tender, normal bowel sounds. Brisk erythema in the inguinal and perineum. There is no skin breakdown. Impression . . . . . . . The patient is tolerating radiation. Plan . . . . . . . . . . . . Continue treatment as planned. Biafine for skin reaction.  ________________________________   Blair Promise, PhD, MD  This document serves as a record of services personally performed by Gery Pray, MD. It was created on his behalf by Bethann Humble, a trained medical scribe. The creation of this record is based on the scribe's personal observations and the provider's statements to them. This document has  been checked and approved by the attending provider.

## 2016-06-03 NOTE — Progress Notes (Signed)
Caitlin [name] has completed 23 fractions to her pelvis.  She reports having skin irritation and burning in her groin area today.  The skin on her groin area is red with a small area of peeling in her right groin.  She also reports having 2 loose stools per day with occasional incontinence and said her skin really burns when this happens.  She is taking Imodium and is using a sitz bath.  She currently has chemotherapy infusing.  BP 128/67 (BP Location: Right Arm, Patient Position: Sitting)   Pulse 81   Temp 98.1 F (36.7 C) (Oral)   Ht 5\' 2"  (1.575 m)   Wt 167 lb (75.8 kg)   SpO2 100%   BMI 30.54 kg/m    Wt Readings from Last 3 Encounters:  06/03/16 167 lb (75.8 kg)  05/31/16 166 lb 3.2 oz (75.4 kg)  05/27/16 166 lb 14.4 oz (75.7 kg)

## 2016-06-04 ENCOUNTER — Ambulatory Visit (HOSPITAL_BASED_OUTPATIENT_CLINIC_OR_DEPARTMENT_OTHER): Payer: Medicare Other

## 2016-06-04 VITALS — BP 125/52 | HR 77 | Temp 98.7°F | Resp 18

## 2016-06-04 DIAGNOSIS — C679 Malignant neoplasm of bladder, unspecified: Secondary | ICD-10-CM | POA: Diagnosis not present

## 2016-06-04 MED ORDER — HEPARIN SOD (PORK) LOCK FLUSH 100 UNIT/ML IV SOLN
250.0000 [IU] | Freq: Once | INTRAVENOUS | Status: AC | PRN
  Administered 2016-06-04: 250 [IU]
  Filled 2016-06-04: qty 5

## 2016-06-04 MED ORDER — SODIUM CHLORIDE 0.9% FLUSH
10.0000 mL | INTRAVENOUS | Status: DC | PRN
  Administered 2016-06-04: 10 mL
  Filled 2016-06-04: qty 10

## 2016-06-04 NOTE — Patient Instructions (Signed)
PICC Removal, Care After Refer to this sheet in the next few weeks. These instructions provide you with information on caring for yourself after your procedure. Your health care provider may also give you more specific instructions. Your treatment has been planned according to current medical practices, but problems sometimes occur. Call your health care provider if you have any problems or questions after your procedure. What can I expect after the procedure? After your procedure, it is typical to have mild discomfort at the insertion site. This should not last for more than a day. Follow these instructions at home: You may remove the bandage after 24 hours. The PICC insertion site is very small. A small scab may develop over the insertion site. It is okay to wash the site gently with soap and water. Be careful not to remove or pick off the scab. Gently pat the site dry after washing it. You do not need to put another bandage over the insertion site. Do not lift anything heavy or do strenuous physical activity for 24 hours after the PICC is removed. This includes:  Weight lifting.  Strenuous yard work.  Any physical activity with repetitive arm movement.  Contact a health care provider if:  You have swelling or puffiness in your arm at the PICC insertion site.  You have increasing tenderness at the PICC insertion site. Get help right away if:  You have numbness or tingling in your fingers, hand, or arm.  Your arm looks blue and feels cold.  You have redness around the insertion site or a red streak goes up your arm.  You have any type of drainage from the PICC insertion site. This includes drainage such as: ? Bleeding from the insertion site. If this happens, apply firm, direct pressure to the PICC insertion site with a clean towel. ? Drainage that is yellow or tan.  You have a fever. This information is not intended to replace advice given to you by your health care provider. Make  sure you discuss any questions you have with your health care provider. Document Released: 05/21/2013 Document Revised: 10/22/2015 Document Reviewed: 03/08/2013 Elsevier Interactive Patient Education  2017 [employer]vier Inc.  

## 2016-06-04 NOTE — Progress Notes (Signed)
Per note on 05/31/16, okay to d/c pump at 1230 06/04/16.  1242: PICC flushed and blood return noted.  1255: PICC removed, pt tolerated well. Vaseline gauze pressure dressing applied. Pt educated on PICC removal instructions such as to not lift for 24 hours, keep dressing on for 24 hours and keep dressing dry, and to monitor for s/s of bleeding or infection,etc.. Pt aware and verbalizes understanding.  1329: pt reports that she feels no changes. Pt and VS stable at discharge.

## 2016-06-06 ENCOUNTER — Ambulatory Visit
Admission: RE | Admit: 2016-06-06 | Discharge: 2016-06-06 | Disposition: A | Discharge status: Home / Self Care | Payer: Medicare Other | Source: Ambulatory Visit | Attending: Radiation Oncology | Admitting: Radiation Oncology

## 2016-06-06 DIAGNOSIS — Z510 Encounter for antineoplastic radiation therapy: Secondary | ICD-10-CM | POA: Diagnosis not present

## 2016-06-07 ENCOUNTER — Ambulatory Visit: Payer: Medicare Other | Admitting: Cardiovascular Disease

## 2016-06-07 ENCOUNTER — Ambulatory Visit
Admission: RE | Admit: 2016-06-07 | Discharge: 2016-06-07 | Disposition: A | Discharge status: Home / Self Care | Payer: Medicare Other | Source: Ambulatory Visit | Attending: Radiation Oncology | Admitting: Radiation Oncology

## 2016-06-07 ENCOUNTER — Encounter: Payer: Self-pay | Admitting: Radiation Oncology

## 2016-06-07 ENCOUNTER — Ambulatory Visit: Payer: Medicare Other | Admitting: Cardiology

## 2016-06-07 VITALS — BP 108/68 | HR 89 | Temp 98.3°F | Ht 62.0 in | Wt 164.0 lb

## 2016-06-07 DIAGNOSIS — C679 Malignant neoplasm of bladder, unspecified: Secondary | ICD-10-CM

## 2016-06-07 DIAGNOSIS — Z510 Encounter for antineoplastic radiation therapy: Secondary | ICD-10-CM | POA: Diagnosis not present

## 2016-06-07 NOTE — Progress Notes (Signed)
Caitlin [name] has completed treatments with 25 fractions to her pelvis.  She reports having sinus drainage is wondering if it is Ok to see her primary care doctor now.  She said her nose has been bleeding.  She denies having pain other than burning in her pelvic area due to skin irritation.  She reports her skin is better and denies having any peeling.  She rpeorts having some burning with urination.  She reports having 2 episodes of diarrhea and is taking Imodium.  She reports feeling fatigued.  She reports having some nausea.  She has been given a one month follow up appointment.  BP 108/68 (BP Location: Right Arm, Patient Position: Sitting)   Pulse 89   Temp 98.3 F (36.8 C) (Oral)   Ht 5\' 2"  (1.575 m)   Wt 164 lb (74.4 kg)   SpO2 96%   BMI 30.00 kg/m    Wt Readings from Last 3 Encounters:  06/07/16 164 lb (74.4 kg)  06/03/16 167 lb (75.8 kg)  05/31/16 166 lb 3.2 oz (75.4 kg)

## 2016-06-07 NOTE — Progress Notes (Signed)
[facility]         [phone]) [phone] ________________________________  Name: Caitlin [name] MRN: [id number]  Date: 06/07/2016  DOB: 04/11/1941  Weekly Radiation Therapy Management    ICD-9-CM ICD-10-CM   1. 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[place]) 188.9 C67.9      Current Dose: 55 Gy     Planned Dose:  55 Gy  Narrative . . . . . . . . The patient presents for routine under treatment assessment.     Kymara Ruen has completed treatment with 25 fractions to her pelvis. She reports having sinus drainage is wondering if it is ok to see her PCP now. She said her nose has been bleeding. She denies having pain other than burning in her pelvic area due to skin irritation.  She reports her skin is better and denies having any peeling.  She reports some dysuria.  She reports having 2 episodes of diarrhea and is taking Imodium.  She reports feeling fatigued.  She reports having some nausea.  She has been given a one month follow up appointment by the nurse.                                  Set-up films were reviewed.                                 The chart was checked. Physical Findings. . .  height is 5\' 2"  (1.575 m) and weight is 164 lb (74.4 kg). Her oral temperature is 98.3 F (36.8 C). Her blood pressure is 108/68 and her pulse is 89. Her oxygen saturation is 96%. . Weight essentially stable. Lungs are clear to auscultation bilaterally. Heart has regular rate and rhythm. Abdomen soft, non-tender, normal bowel sounds. Impression . . . . . . . The patient has tolerated radiation. Plan . . . . . . . . . . . . The patient has completed radiation and will return in 1 month for a follow up. If her sinus issues persist for a week, then she could see her PCP.  ________________________________   Blair Promise, PhD, MD  This document serves as a record of services personally performed by Gery Pray, MD. It was created on his behalf by Darcus Austin, a trained medical scribe. The creation of this record is based on the  scribe's personal observations and the provider's statements to them. This document has been checked and approved by the attending provider.

## 2016-06-09 ENCOUNTER — Telehealth: Payer: Self-pay | Admitting: Oncology

## 2016-06-09 ENCOUNTER — Ambulatory Visit (HOSPITAL_BASED_OUTPATIENT_CLINIC_OR_DEPARTMENT_OTHER): Payer: Medicare Other | Admitting: Nurse Practitioner

## 2016-06-09 ENCOUNTER — Other Ambulatory Visit (HOSPITAL_BASED_OUTPATIENT_CLINIC_OR_DEPARTMENT_OTHER): Payer: Medicare Other

## 2016-06-09 VITALS — BP 119/58 | HR 92 | Temp 97.7°F | Resp 18 | Wt 163.6 lb

## 2016-06-09 DIAGNOSIS — C774 Secondary and unspecified malignant neoplasm of inguinal and lower limb lymph nodes: Secondary | ICD-10-CM

## 2016-06-09 DIAGNOSIS — R197 Diarrhea, unspecified: Secondary | ICD-10-CM

## 2016-06-09 DIAGNOSIS — C679 Malignant neoplasm of bladder, unspecified: Secondary | ICD-10-CM

## 2016-06-09 DIAGNOSIS — I10 Essential (primary) hypertension: Secondary | ICD-10-CM

## 2016-06-09 DIAGNOSIS — Z853 Personal history of malignant neoplasm of breast: Secondary | ICD-10-CM

## 2016-06-09 LAB — COMPREHENSIVE METABOLIC PANEL
ALT: 8 U/L (ref 0–55)
ANION GAP: 9 meq/L (ref 3–11)
AST: 14 U/L (ref 5–34)
Albumin: 3.2 g/dL — ABNORMAL LOW (ref 3.5–5.0)
Alkaline Phosphatase: 56 U/L (ref 40–150)
BUN: 29.9 mg/dL — ABNORMAL HIGH (ref 7.0–26.0)
CALCIUM: 9.7 mg/dL (ref 8.4–10.4)
CHLORIDE: 106 meq/L (ref 98–109)
CO2: 22 mEq/L (ref 22–29)
CREATININE: 1.4 mg/dL — AB (ref 0.6–1.1)
EGFR: 35 mL/min/{1.73_m2} — AB (ref >90)
Glucose: 102 mg/dl (ref 70–140)
POTASSIUM: 4.3 meq/L (ref 3.5–5.1)
Sodium: 137 mEq/L (ref 136–145)
Total Bilirubin: 0.44 mg/dL (ref 0.20–1.20)
Total Protein: 7.1 g/dL (ref 6.4–8.3)

## 2016-06-09 LAB — CBC WITH DIFFERENTIAL/PLATELET
BASO%: 0 % (ref 0.0–2.0)
BASOS ABS: 0 10*3/uL (ref 0.0–0.1)
EOS%: 7.4 % — ABNORMAL HIGH (ref 0.0–7.0)
Eosinophils Absolute: 0.4 10*3/uL (ref 0.0–0.5)
HEMATOCRIT: 32.6 % — AB (ref 34.8–46.6)
HGB: 10.5 g/dL — ABNORMAL LOW (ref 11.6–15.9)
LYMPH#: 0.2 10*3/uL — AB (ref 0.9–3.3)
LYMPH%: 3.8 % — ABNORMAL LOW (ref 14.0–49.7)
MCH: 31 pg (ref 25.1–34.0)
MCHC: 32.2 g/dL (ref 31.5–36.0)
MCV: 96.2 fL (ref 79.5–101.0)
MONO#: 0.1 10*3/uL (ref 0.1–0.9)
MONO%: 2.7 % (ref 0.0–14.0)
NEUT#: 4.1 10*3/uL (ref 1.5–6.5)
NEUT%: 86.1 % — AB (ref 38.4–76.8)
PLATELETS: 291 10*3/uL (ref 145–400)
RBC: 3.39 10*6/uL — AB (ref 3.70–5.45)
RDW: 15.6 % — ABNORMAL HIGH (ref 11.2–14.5)
WBC: 4.8 10*3/uL (ref 3.9–10.3)

## 2016-06-09 NOTE — Progress Notes (Addendum)
  [facility] OFFICE PROGRESS NOTE   Diagnosis:  Bladder cancer  INTERVAL HISTORY:   Caitlin [name] returns as scheduled. She completed cycle 2 5-FU/mitomycin C beginning 05/31/2016. She completed the course of radiation on 06/07/2016. She denies nausea/vomiting. No mouth sores. She has a mild sore throat that is improving. She is tolerating fluids without difficulty. She has intermittent diarrhea. Imodium is effective. Main complaint is being tired.  Objective:  Vital signs in last 24 hours:  Blood pressure (!) 119/58, pulse 92, temperature 97.7 F (36.5 C), temperature source Oral, resp. rate 18, weight 163 lb 9.6 oz (74.2 kg), SpO2 97 %.    HEENT: Posterior palate is mildly erythematous. No thrush or ulcers. Lymphatics: Approximate 1-2 cm firm node right inguinal region. Resp: Lungs clear bilaterally. Cardio: Regular rate and rhythm. GI: Abdomen soft and nontender. No hepatomegaly. Vascular: No leg edema. Skin: Erythema over the groin and pubic/labial regions. Superficial ulceration bilateral inguinal regions.    Lab Results:  Lab Results  Component Value Date   WBC 4.8 06/09/2016   HGB 10.5 (L) 06/09/2016   HCT 32.6 (L) 06/09/2016   MCV 96.2 06/09/2016   PLT 291 06/09/2016   NEUTROABS 4.1 06/09/2016    Imaging:  No results found.  Medications: I have reviewed the patient's current medications.  Assessment/Plan: 1. 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 Cystectomy, bilateral pelvic lymphadenectomy, and ileal neobladder 04/21/2008-no residual malignancy identified ? 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 Chest x-ray 01/29/2016-negative for metastatic disease ? CT abdomen/pelvis 02/02/2016-enlarged right  inguinal lymph node with a borderline adjacent right common femoral node ? Biopsyof right inguinal lymph node on 02/17/2016 confirming metastatic carcinoma, pathology review at [facility] found the inguinal lymph node biopsy to be consistent with metastatic carcinoma similar to the urethral mass. ? PET scan at [facility] on 03/31/2016 revealed right external iliac and inguinal hypermetabolic lymph nodes and hypermetabolic activity associated with a sclerotic lesion at C3. MRI 04/12/2016 consistent with degenerative changes at C3-C4 ? Initiation of radiation 05/02/2016; completion of radiation 06/08/2015 ? Cycle 1 5-FU/mitomycin C 05/03/2016 ? Cycle 2 5-FU/mitomycin-C 06/03/2015  2. Breast cancer, left T1b, N0-2005, status post lumpectomy/radiation and Aromasin  3. Hypertension  4.   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 Ritenour appears stable. She has completed the course of 5-FU/mitomycin C and radiation. The right inguinal lymph node is smaller. She has skin toxicity related to treatment. She will continue supportive care. She is taking Imodium for the diarrhea. She will return for a follow-up visit and CBC in one month. She will contact the office in the interim with any problems. We specifically discussed poorly controlled diarrhea.  Patient seen with Dr. Benay Spice.    Ned Card ANP/GNP-BC   06/09/2016  10:23 AM This was a shared visit with Ned Card. Ms. Carney was interviewed and examined. The right inguinal lymph node appears smaller. She has completed the planned course of chemotherapy and radiation.  Julieanne Manson, M.D.

## 2016-06-09 NOTE — Telephone Encounter (Signed)
Gave patient avs report and appointments for February.  °

## 2016-06-16 NOTE — Progress Notes (Signed)
  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  Beams/energy:   IMRT // 6X Photon  Narrative: The patient tolerated radiation treatment relatively well. Patient complained of dysuria, diarrhea, fatigue, nausea, and skin irritation in the treatment area. There was brisk erythema in the inguinal and perineum areas and biafine was provided for the patient. The patient also had vaginal irritation and itching with scant white vaginal discharge. This was a possible yeast infection therefore Diflucan was prescribed. The patient used sitz baths to help with irritation as well.  Plan: The patient has completed radiation treatment. The patient will return to radiation oncology clinic for routine followup in one month. I advised them to call or return sooner if they have any questions or concerns related to their recovery or treatment.  -----------------------------------  Blair Promise, PhD, MD  This document serves as a record of services personally performed by Gery Pray, MD. It was created on his behalf by Darcus Austin, a trained medical scribe. The creation of this record is based on the scribe's personal observations and the provider's statements to them. This document has been checked and approved by the attending provider.

## 2016-07-01 ENCOUNTER — Ambulatory Visit (INDEPENDENT_AMBULATORY_CARE_PROVIDER_SITE_OTHER): Payer: Medicare Other | Admitting: Cardiovascular Disease

## 2016-07-01 ENCOUNTER — Encounter: Payer: Self-pay | Admitting: Cardiovascular Disease

## 2016-07-01 ENCOUNTER — Encounter (INDEPENDENT_AMBULATORY_CARE_PROVIDER_SITE_OTHER): Payer: Self-pay

## 2016-07-01 VITALS — BP 106/60 | HR 88 | Ht 62.0 in | Wt 159.0 lb

## 2016-07-01 DIAGNOSIS — I10 Essential (primary) hypertension: Secondary | ICD-10-CM | POA: Diagnosis not present

## 2016-07-01 NOTE — Progress Notes (Signed)
Cardiology Office Note   Date:  07/01/2016   ID:  Caitlin [name], Caitlin [name] 06-Sep-1940, MRN [id number]  PCP:  Tivis Ringer, MD  Cardiologist: Darlin Coco MD  Chief Complaint  Patient presents with  . 1 year follow up    PACs    Problem List 1. MVP  2.  Premature atrial contractions.  3. Essential HTN   Previous notes from Caitlin [name] is a 76 y.o. female who presents for a one-year follow-up office visit.  This pleasant 76 year old woman is seen for a one-year follow-up office visit.  She reminds me that we did a stress test on her about 10 years ago which was normal. She has been told in the remote past that she may have mitral valve prolapse. She has a history of hypertension followed closely by Dr. Dagmar Hait. She was hospitalized May 14-16, 2014 after having a severe dizzy spell in her doctor's office. She had presyncope. The patient had a subsequent echocardiogram 12/04/12 which showed grade 1 diastolic dysfunction. She had a normal ejection fraction at 65-70%. There was mild mitral valve thickening. Since last visit she has had no further episodes of presyncope or syncope.  She has a past history of premature atrial contractions.  She has not been aware of any recent palpitations.  She is able to drink diet Coke without causing palpitations.  However sweet tea often causes palpitations. Denies any exertional chest pain or angina.  She does have some mild exertional dyspnea worse in the hot humid weather. She has a past history of bladder cancer.  She has a neobladder.  She self catheterizes herself. She is followed by Dr. Dorina Hoyer.   Feb. 2, 2018:  Caitlin [name] is seen for the first time today .  Transfer from [place].  She's followed for occasional palpitations. She has documented premature atrial contractions. She's never had any serious arrhythmias. She's never had any episodes of syncope or presyncope.   Past Medical History:  Diagnosis Date  . Arthritis   . Bladder  absent Pt had bladder removed in 2010  . Bladder cancer ([place])   . Breast cancer ([place])   . Chronic kidney disease    does not empty bladder,self cath  . Complication of anesthesia   . GERD (gastroesophageal reflux disease)   . Hypertension   . Mitral valve prolapse   . PONV (postoperative nausea and vomiting)   . Self-catheterizes urinary bladder     Past Surgical History:  Procedure Laterality Date  . ABDOMINAL HYSTERECTOMY  1980   total  . ANTERIOR AND POSTERIOR REPAIR N/A 01/25/2016   Procedure: Excision of suburethral mass;  Surgeon: Irine Seal, MD;  Location: [facility];  Service: Urology;  Laterality: N/A;  . BLADDER SURGERY  2009   due to cancer  . BREAST SURGERY  2005   Left Breast Lumpectomy  . CHOLECYSTECTOMY N/A 02/19/2013   Procedure: LAPAROSCOPIC CHOLECYSTECTOMY WITH INTRAOPERATIVE CHOLANGIOGRAM;  Surgeon: Ralene Ok, MD;  Location: [facility];  Service: General;  Laterality: N/A;  . IR GENERIC HISTORICAL  05/03/2016   IR FLUORO GUIDE CV LINE RIGHT 05/03/2016 Corrie Mckusick, DO [facility]  . IR GENERIC HISTORICAL  05/03/2016   IR US GUIDE VASC ACCESS RIGHT 05/03/2016 Corrie Mckusick, DO [facility]  . IR GENERIC HISTORICAL  05/31/2016   IR FLUORO GUIDE CV LINE LEFT 05/31/2016 Sandi Mariscal, MD [facility]  . IR GENERIC HISTORICAL  05/31/2016   IR US GUIDE VASC ACCESS LEFT 05/31/2016 Jenny Reichmann  Pascal Lux, MD [facility]  . REPLACEMENT TOTAL KNEE Left      Current Outpatient Prescriptions  Medication Sig Dispense Refill  . acetaminophen (TYLENOL) 325 MG tablet Take 650 mg by mouth every 6 (six) hours as needed for pain.    [name] alendronate (FOSAMAX) 35 MG tablet Take 35 mg by mouth every 7 (seven) days. Take with a full glass of water on an empty stomach.    [name] amLODipine (NORVASC) 5 MG tablet     . aspirin 81 MG tablet Take 81 mg by mouth daily.    . Cholecalciferol (VITAMIN D3) 5000 units TABS Take by mouth.    . diphenhydrAMINE (BENADRYL) 25 MG tablet Take 50 mg by mouth at bedtime.    [name]  escitalopram (LEXAPRO) 10 MG tablet Take 10 mg by mouth at bedtime.     . fluticasone (FLONASE) 50 MCG/ACT nasal spray Place 1 spray into the nose daily as needed for allergies.     [name] lisinopril (PRINIVIL,ZESTRIL) 20 MG tablet Take 1 tablet (20 mg total) by mouth daily. 30 tablet 0  . loperamide (IMODIUM) 2 MG capsule Take 2 mg by mouth as needed for diarrhea or loose stools.    [name] omeprazole (PRILOSEC) 20 MG capsule Take 20 mg by mouth daily.     . prochlorperazine (COMPAZINE) 10 MG tablet Take 1 tablet (10 mg total) by mouth every 6 (six) hours as needed for nausea or vomiting. 30 tablet 0  . VITAMIN B1-B12 IJ Inject as directed every 30 (thirty) days.     No current facility-administered medications for this visit.     Allergies:   Patient has no known allergies.    Social History:  The patient  reports that she has never smoked. She has never used smokeless tobacco. She reports that she does not drink alcohol or use drugs.   Family History:  The patient's family history includes Asthma in her sister; Stroke in her father and mother.    ROS:  Please see the history of present illness.   Otherwise, review of systems are positive for none.   All other systems are reviewed and negative.    PHYSICAL EXAM: VS:  BP 106/60   Pulse 88   Ht 5\' 2"  (1.575 m)   Wt 159 lb (72.1 kg)   SpO2 97%   BMI 29.08 kg/m  , BMI Body mass index is 29.08 kg/m. GEN: Well nourished, well developed, in no acute distress  HEENT: normal  Neck: no JVD, carotid bruits, or masses Cardiac: RRR; no murmurs, rubs, or gallops,no edema  Respiratory:  clear to auscultation bilaterally, normal work of breathing GI: soft, nontender, nondistended, + BS MS: no deformity or atrophy  Skin: warm and dry, no rash Neuro:  Strength and sensation are intact Psych: euthymic mood, full affect   EKG:  EKG is ordered today. The ekg ordered today demonstrates normal sinus rhythm with minor nonspecific ST-T wave  changes.   Recent Labs: 06/09/2016: ALT 8; BUN 29.9; Creatinine 1.4; HGB 10.5; Platelets 291; Potassium 4.3; Sodium 137    Lipid Panel No results found for: CHOL, TRIG, HDL, CHOLHDL, VLDL, LDLCALC, LDLDIRECT    Wt Readings from Last 3 Encounters:  07/01/16 159 lb (72.1 kg)  06/09/16 163 lb 9.6 oz (74.2 kg)  06/07/16 164 lb (74.4 kg)        ASSESSMENT AND PLAN:  1. essential hypertension without heart failure 2. diastolic dysfunction by echo 3. mild exogenous obesity 4. status post bladder  surgery with neobladder by Dr. Vernie Shanks 5.  Past history of PACs   She has been followed by Dr. Dagmar Hait. We do not have her on any meds.   We will see her on an as needed basis.     Mertie Moores, MD  07/01/2016 10:28 AM    [address],  [place], [place]  [address] Pager [phone] Phone: [phone]; Fax: [phone]

## 2016-07-01 NOTE — Patient Instructions (Signed)

## 2016-07-07 ENCOUNTER — Ambulatory Visit (HOSPITAL_BASED_OUTPATIENT_CLINIC_OR_DEPARTMENT_OTHER): Payer: Medicare Other | Admitting: Oncology

## 2016-07-07 ENCOUNTER — Other Ambulatory Visit (HOSPITAL_BASED_OUTPATIENT_CLINIC_OR_DEPARTMENT_OTHER): Payer: Medicare Other

## 2016-07-07 ENCOUNTER — Telehealth: Payer: Self-pay | Admitting: Oncology

## 2016-07-07 VITALS — BP 119/59 | HR 80 | Temp 97.5°F | Resp 18 | Ht 62.0 in | Wt 159.5 lb

## 2016-07-07 DIAGNOSIS — D649 Anemia, unspecified: Secondary | ICD-10-CM | POA: Diagnosis not present

## 2016-07-07 DIAGNOSIS — C679 Malignant neoplasm of bladder, unspecified: Secondary | ICD-10-CM | POA: Diagnosis not present

## 2016-07-07 DIAGNOSIS — C774 Secondary and unspecified malignant neoplasm of inguinal and lower limb lymph nodes: Secondary | ICD-10-CM | POA: Diagnosis not present

## 2016-07-07 DIAGNOSIS — Z853 Personal history of malignant neoplasm of breast: Secondary | ICD-10-CM

## 2016-07-07 DIAGNOSIS — I10 Essential (primary) hypertension: Secondary | ICD-10-CM | POA: Diagnosis not present

## 2016-07-07 LAB — CBC WITH DIFFERENTIAL/PLATELET
BASO%: 0.4 % (ref 0.0–2.0)
Basophils Absolute: 0 10*3/uL (ref 0.0–0.1)
EOS ABS: 0.1 10*3/uL (ref 0.0–0.5)
EOS%: 2.1 % (ref 0.0–7.0)
HCT: 30.9 % — ABNORMAL LOW (ref 34.8–46.6)
HGB: 10.2 g/dL — ABNORMAL LOW (ref 11.6–15.9)
LYMPH%: 11.1 % — AB (ref 14.0–49.7)
MCH: 32.4 pg (ref 25.1–34.0)
MCHC: 33.1 g/dL (ref 31.5–36.0)
MCV: 97.7 fL (ref 79.5–101.0)
MONO#: 0.5 10*3/uL (ref 0.1–0.9)
MONO%: 7 % (ref 0.0–14.0)
NEUT%: 79.4 % — ABNORMAL HIGH (ref 38.4–76.8)
NEUTROS ABS: 5.3 10*3/uL (ref 1.5–6.5)
Platelets: 413 10*3/uL — ABNORMAL HIGH (ref 145–400)
RBC: 3.16 10*6/uL — ABNORMAL LOW (ref 3.70–5.45)
RDW: 20.3 % — ABNORMAL HIGH (ref 11.2–14.5)
WBC: 6.6 10*3/uL (ref 3.9–10.3)
lymph#: 0.7 10*3/uL — ABNORMAL LOW (ref 0.9–3.3)

## 2016-07-07 NOTE — Progress Notes (Signed)
  [facility] OFFICE PROGRESS NOTE   Diagnosis: Bladder cancer  INTERVAL HISTORY:   Caitlin [name] returns as scheduled. She feels well. She reports mild alopecia following chemotherapy. She cannot palpate the right groin lymph node. Her urination has improved.  Objective:  Vital signs in last 24 hours:  Blood pressure (!) 119/59, pulse 80, temperature 97.5 F (36.4 C), temperature source Oral, resp. rate 18, height 5\' 2"  (1.575 m), weight 159 lb 8 oz (72.3 kg), SpO2 99 %.    HEENT: No thrush or ulcers Lymphatics: No cervical, supraclavicular, axillary, or left inguinal nodes. 1.5 cm firm right inguinal node Resp: Lungs clear bilaterally Cardio: Regular rate and rhythm GI: No hepatosplenomegaly, no mass, nontender Vascular: No leg edema   Lab Results:  Lab Results  Component Value Date   WBC 6.6 07/07/2016   HGB 10.2 (L) 07/07/2016   HCT 30.9 (L) 07/07/2016   MCV 97.7 07/07/2016   PLT 413 (H) 07/07/2016   NEUTROABS 5.3 07/07/2016     Medications: I have reviewed the patient's current medications.  Assessment/Plan: 1. 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 Cystectomy, bilateral pelvic lymphadenectomy, and ileal neobladder 04/21/2008-no residual malignancy identified ? 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 Chest x-ray 01/29/2016-negative for metastatic disease ? CT abdomen/pelvis 02/02/2016-enlarged right inguinal lymph node with a borderline adjacent right common femoral node ? Biopsyof right inguinal lymph node on 02/17/2016 confirming metastatic carcinoma, pathology review at [facility] found the inguinal lymph node biopsy to be consistent with metastatic carcinoma similar to the urethral mass. ? PET scan at [facility] on  03/31/2016 revealed right external iliac and inguinal hypermetabolic lymph nodes and hypermetabolic activity associated with a sclerotic lesion at C3. MRI 04/12/2016 consistent with degenerative changes at C3-C4 ? Initiation of radiation 05/02/2016; completion of radiation 06/08/2015 ? Cycle 1 5-FU/mitomycin C 05/03/2016 ? Cycle 2 5-FU/mitomycin-C 06/03/2015  2. Breast cancer, left T1b, N0-2005, status post lumpectomy/radiation and Aromasin  3. Hypertension  4.   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   Mild anemia-likely secondary to chemotherapy and radiation    Disposition:  Caitlin [name] appears well. The right inguinal lymph nodes clearly smaller compared to pretreatment. She is scheduled to see Dr.Kinard later this month. She will return for an office visit in approximately 2 months. We will consider obtaining a restaging CT after the next office visit.  15 minutes were spent with the patient today. The majority of the time was used for counseling and coordination of care.   Betsy Coder, MD  07/07/2016  10:44 AM

## 2016-07-07 NOTE — Telephone Encounter (Signed)
Appointments scheduled per 07/07/16 los. Patient was given a copy of the AVS report and appointment schedule per 07/07/16 los. °

## 2016-07-21 ENCOUNTER — Ambulatory Visit: Payer: Medicare Other | Admitting: Radiation Oncology

## 2016-08-10 ENCOUNTER — Encounter: Payer: Self-pay | Admitting: Oncology

## 2016-08-15 ENCOUNTER — Encounter: Payer: Self-pay | Admitting: Radiation Oncology

## 2016-08-15 ENCOUNTER — Ambulatory Visit
Admission: RE | Admit: 2016-08-15 | Discharge: 2016-08-15 | Disposition: A | Discharge status: Home / Self Care | Payer: Medicare Other | Source: Ambulatory Visit | Attending: Radiation Oncology | Admitting: Radiation Oncology

## 2016-08-15 DIAGNOSIS — Z79899 Other long term (current) drug therapy: Secondary | ICD-10-CM | POA: Insufficient documentation

## 2016-08-15 DIAGNOSIS — Z923 Personal history of irradiation: Secondary | ICD-10-CM | POA: Insufficient documentation

## 2016-08-15 DIAGNOSIS — Z7982 Long term (current) use of aspirin: Secondary | ICD-10-CM | POA: Diagnosis not present

## 2016-08-15 DIAGNOSIS — Z9221 Personal history of antineoplastic chemotherapy: Secondary | ICD-10-CM | POA: Insufficient documentation

## 2016-08-15 DIAGNOSIS — R590 Localized enlarged lymph nodes: Secondary | ICD-10-CM | POA: Diagnosis not present

## 2016-08-15 DIAGNOSIS — C679 Malignant neoplasm of bladder, unspecified: Secondary | ICD-10-CM | POA: Diagnosis not present

## 2016-08-15 NOTE — Progress Notes (Signed)
Radiation Oncology         [phone] ________________________________  Name: Caitlin [name] MRN: [id number]  Date: 08/15/2016  DOB: 22-Sep-1940  Follow-Up Visit Note  CC: Tivis Ringer, MD  Ladell Pier, MD    ICD-9-CM ICD-10-CM   1. 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 Her last chemotherapy treatment was on 05/31/16.  She denies pain, dysuria, or urinary frequency. She reports occasional diarrhea that is diet-dependent. She denies vaginal/rectal bleeding. She reports improving energy level.                              ALLERGIES:  has No Known Allergies.  Meds: Current Outpatient Prescriptions  Medication Sig Dispense Refill  . acetaminophen (TYLENOL) 325 MG tablet Take 650 mg by mouth every 6 (six) hours as needed for pain.    [name] alendronate (FOSAMAX) 35 MG tablet Take 35 mg by mouth every 7 (seven) days. Take with a full glass of water on an empty stomach.    [name] aspirin 81 MG tablet Take 81 mg by mouth daily.    . Cholecalciferol (VITAMIN D3) 5000 units TABS Take by mouth.    . escitalopram (LEXAPRO) 10 MG tablet Take 10 mg by mouth at bedtime.     [name] lisinopril (PRINIVIL,ZESTRIL) 20 MG tablet Take 20 mg by mouth daily.    [name] loperamide (IMODIUM) 2 MG capsule Take 2 mg by mouth as needed for diarrhea or loose stools.    [name] omeprazole (PRILOSEC) 20 MG capsule Take 20 mg by mouth daily.     [name] VITAMIN B1-B12 IJ Inject as directed every 30 (thirty) days.    [name] amLODipine (NORVASC) 5 MG tablet     . diphenhydrAMINE (BENADRYL) 25 MG tablet Take 50 mg by mouth at bedtime.    . fluticasone (FLONASE) 50 MCG/ACT nasal spray Place 1 spray into the nose  daily as needed for allergies.     [name] prochlorperazine (COMPAZINE) 10 MG tablet Take 1 tablet (10 mg total) by mouth every 6 (six) hours as needed for nausea or vomiting. (Patient not taking: Reported on 07/07/2016) 30 tablet 0   No current facility-administered medications for this encounter.     Physical Findings: The patient is in no acute distress. Patient is alert and oriented.  height is 5\' 2"  (1.575 m) and weight is 164 lb 4.8 oz (74.5 kg). Her oral temperature is 97.9 F (36.6 C). Her blood pressure is 143/69 (abnormal) and her pulse is 72. Her oxygen saturation is 100%. .  No significant changes.  Lungs are clear to auscultation bilaterally. Heart has regular rate and rhythm. No palpable cervical, supraclavicular, or axillary adenopathy. Abdomen soft, non-tender, normal bowel sounds.  The patient's skin has healed well in the inguinal and perineal area. There is no palpable adenopathy in the right pelvis.  Lab Findings: Lab Results  Component Value Date   WBC 6.6 07/07/2016   HGB 10.2 (L) 07/07/2016   HCT 30.9 (L) 07/07/2016   MCV 97.7 07/07/2016   PLT 413 (H) 07/07/2016    Radiographic Findings: No results found.  Impression:  The patient is recovering from  the effects of radiation. Clinically stable, her right inguinal node is no longer palpable.  Plan:  Patient will follow up with radiation oncology in July 2018. She will follow up with medical oncology in April. She will have a CT scan either prior to that visit with Dr. Benay Spice or in May.   ____________________________________    This document serves as a record of services personally performed by Gery Pray, MD. It was created on his behalf by Bethann Humble, a trained medical scribe. The creation of this record is based on the scribe's personal observations and the provider's statements to them. This document has been checked and approved by the attending provider.

## 2016-08-15 NOTE — Progress Notes (Signed)
Caitlin [name] is here for follow up after treatment to her pelvis.  She denies having pain.  She denies having any dysuria or urinary frequency.  She reports having diarrhea occasionally depending on what she eats.  She denies having any vaginal/rectal bleeding.  She reports her energy level is improving.  Her last chemotherapy treatment was 05/31/16.  BP (!) 143/69 (BP Location: Right Arm, Patient Position: Sitting)   Pulse 72   Temp 97.9 F (36.6 C) (Oral)   Ht 5\' 2"  (1.575 m)   Wt 164 lb 4.8 oz (74.5 kg)   SpO2 100%   BMI 30.05 kg/m    Wt Readings from Last 3 Encounters:  08/15/16 164 lb 4.8 oz (74.5 kg)  07/07/16 159 lb 8 oz (72.3 kg)  07/01/16 159 lb (72.1 kg)

## 2016-08-30 ENCOUNTER — Other Ambulatory Visit: Payer: Self-pay | Admitting: Internal Medicine

## 2016-08-30 DIAGNOSIS — Z1231 Encounter for screening mammogram for malignant neoplasm of breast: Secondary | ICD-10-CM

## 2016-09-08 ENCOUNTER — Telehealth: Payer: Self-pay | Admitting: Oncology

## 2016-09-08 ENCOUNTER — Other Ambulatory Visit (HOSPITAL_BASED_OUTPATIENT_CLINIC_OR_DEPARTMENT_OTHER): Payer: Medicare Other

## 2016-09-08 ENCOUNTER — Ambulatory Visit (HOSPITAL_BASED_OUTPATIENT_CLINIC_OR_DEPARTMENT_OTHER): Payer: Medicare Other | Admitting: Oncology

## 2016-09-08 VITALS — BP 150/63 | HR 81 | Temp 98.2°F | Resp 17 | Ht 62.0 in | Wt 160.6 lb

## 2016-09-08 DIAGNOSIS — C774 Secondary and unspecified malignant neoplasm of inguinal and lower limb lymph nodes: Secondary | ICD-10-CM | POA: Diagnosis not present

## 2016-09-08 DIAGNOSIS — C679 Malignant neoplasm of bladder, unspecified: Secondary | ICD-10-CM | POA: Diagnosis not present

## 2016-09-08 DIAGNOSIS — I10 Essential (primary) hypertension: Secondary | ICD-10-CM

## 2016-09-08 DIAGNOSIS — Z853 Personal history of malignant neoplasm of breast: Secondary | ICD-10-CM | POA: Diagnosis not present

## 2016-09-08 LAB — BASIC METABOLIC PANEL
ANION GAP: 9 meq/L (ref 3–11)
BUN: 13.5 mg/dL (ref 7.0–26.0)
CO2: 24 mEq/L (ref 22–29)
Calcium: 9.2 mg/dL (ref 8.4–10.4)
Chloride: 108 mEq/L (ref 98–109)
Creatinine: 1.2 mg/dL — ABNORMAL HIGH (ref 0.6–1.1)
EGFR: 45 mL/min/{1.73_m2} — AB (ref >90)
Glucose: 87 mg/dl (ref 70–140)
POTASSIUM: 3.5 meq/L (ref 3.5–5.1)
Sodium: 141 mEq/L (ref 136–145)

## 2016-09-08 LAB — CBC WITH DIFFERENTIAL/PLATELET
BASO%: 0.2 % (ref 0.0–2.0)
Basophils Absolute: 0 10*3/uL (ref 0.0–0.1)
EOS%: 4.6 % (ref 0.0–7.0)
Eosinophils Absolute: 0.3 10*3/uL (ref 0.0–0.5)
HCT: 34.4 % — ABNORMAL LOW (ref 34.8–46.6)
HGB: 10.9 g/dL — ABNORMAL LOW (ref 11.6–15.9)
LYMPH%: 15 % (ref 14.0–49.7)
MCH: 32.2 pg (ref 25.1–34.0)
MCHC: 31.7 g/dL (ref 31.5–36.0)
MCV: 101.8 fL — ABNORMAL HIGH (ref 79.5–101.0)
MONO#: 0.4 10*3/uL (ref 0.1–0.9)
MONO%: 6.4 % (ref 0.0–14.0)
NEUT#: 4.6 10*3/uL (ref 1.5–6.5)
NEUT%: 73.8 % (ref 38.4–76.8)
Platelets: 337 10*3/uL (ref 145–400)
RBC: 3.38 10*6/uL — AB (ref 3.70–5.45)
RDW: 14.1 % (ref 11.2–14.5)
WBC: 6.3 10*3/uL (ref 3.9–10.3)
lymph#: 0.9 10*3/uL (ref 0.9–3.3)

## 2016-09-08 NOTE — Telephone Encounter (Signed)
Scheduled appt per 4/12 los. - [facility] to contact patient with CT schedule.

## 2016-09-08 NOTE — Progress Notes (Signed)
  [facility] OFFICE PROGRESS NOTE   Diagnosis: Urothelial carcinoma  INTERVAL HISTORY:   Caitlin [name] returns as scheduled per she feels well. No difficulty with self-catheterization. No palpable inguinal node. No complaint.  Objective:  Vital signs in last 24 hours:  Blood pressure (!) 150/60, pulse 81, temperature 98.2 F (36.8 C), temperature source Oral, resp. rate 17, height 5\' 2"  (1.575 m), weight 160 lb 9.6 oz (72.8 kg), SpO2 100 %.    HEENT: Neck without mass Lymphatics: No cervical, supraclavicular, axillary, or left inguinal nodes. Possible 1 cm remaining firm right inguinal node Resp: Lungs clear bilaterally Cardio: Regular rate and rhythm GI: No hepatosplenomegaly, nontender, no mass Vascular: No leg edema   Lab Results:  Lab Results  Component Value Date   WBC 6.3 09/08/2016   HGB 10.9 (L) 09/08/2016   HCT 34.4 (L) 09/08/2016   MCV 101.8 (H) 09/08/2016   PLT 337 09/08/2016   NEUTROABS 4.6 09/08/2016     Medications: I have reviewed the patient's current medications.  Assessment/Plan: 1.Bladder carcinoma, sarcomatoid urothelial carcinoma-status post trans-urethral resection of a left bladder wall tumor in September 2009 ? Cystectomy, bilateral pelvic lymphadenectomy, and ileal neobladder 04/21/2008-no residual malignancy identified ? 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 Chest x-ray 01/29/2016-negative for metastatic disease ? CT abdomen/pelvis 02/02/2016-enlarged right inguinal lymph node with a borderline adjacent right common femoral node ? Biopsyof right inguinal lymph node on 02/17/2016 confirming metastatic carcinoma, pathology review at [facility] found the inguinal lymph node biopsy to be consistent with metastatic carcinoma similar to the urethral mass. ? [facility] scan at [facility] on 03/31/2016 revealed  right external iliac and inguinal hypermetabolic lymph nodes and hypermetabolic activity associated with a sclerotic lesion at C3. MRI 04/12/2016 consistent with degenerative changes at C3-C4 ? Initiation of radiation 05/02/2016; completion of radiation 06/08/2015 ? Cycle 1 5-FU/mitomycin C 05/03/2016 ? Cycle 2 5-FU/mitomycin-C 06/03/2015 ? Radiation completed 06/07/2016  2. Breast cancer, left T1b, N0-2005, status post lumpectomy/radiation and Aromasin  3. Hypertension  4.   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 The palpable right inguinal lymph node appears to have almost completely resolved. She will be referred for a restaging pelvic CT within the next few weeks. She is scheduled to see Dr.Kinard In July. She will return for an office visit here in 6 months.  15 minutes were spent with the patient today. The majority of the time was used for counseling and coordination of care.  Betsy Coder, MD  09/08/2016  9:44 AM

## 2016-09-15 ENCOUNTER — Ambulatory Visit (HOSPITAL_COMMUNITY)
Admission: RE | Admit: 2016-09-15 | Discharge: 2016-09-15 | Disposition: A | Discharge status: Home / Self Care | Payer: Medicare Other | Source: Ambulatory Visit | Attending: Oncology | Admitting: Oncology

## 2016-09-15 ENCOUNTER — Encounter (HOSPITAL_COMMUNITY): Payer: Self-pay

## 2016-09-15 DIAGNOSIS — R590 Localized enlarged lymph nodes: Secondary | ICD-10-CM | POA: Diagnosis not present

## 2016-09-15 DIAGNOSIS — C679 Malignant neoplasm of bladder, unspecified: Secondary | ICD-10-CM | POA: Diagnosis present

## 2016-09-15 MED ORDER — IOPAMIDOL (ISOVUE-300) INJECTION 61%
INTRAVENOUS | Status: AC
  Administered 2016-09-15: 80 mL
  Filled 2016-09-15: qty 100

## 2016-09-16 ENCOUNTER — Telehealth: Payer: Self-pay | Admitting: *Deleted

## 2016-09-16 NOTE — Telephone Encounter (Signed)
Received call from pt asking for results of CT done yest.  Message to Dr Sherrill/Pod RN. She can be reached at [phone]

## 2016-09-16 NOTE — Telephone Encounter (Signed)
Message received from patient requesting call back regarding her CT scan results.  Call placed to patient and patient notified per order of Dr. Benay Spice that CT shows one inguinal lymph node that is unchanged and that patient will be put on GI conference schedule for 09/21/16.  Patient appreciative of call back and has no questions at this time.

## 2016-09-23 ENCOUNTER — Telehealth: Payer: Self-pay | Admitting: *Deleted

## 2016-09-23 NOTE — Telephone Encounter (Signed)
Late entry for 4/26: Spoke with pt, informed of recommendation from conference: Recommend referral back to urology/surgeon to decide whether to biopsy or remove the lymph node due to pt's prior radiation tx. Pt feels strongly she would like to keep her care local. Pt will be on vacation 5/16-5/24 requests that we plan around those dates for any appointments. Dr. Benay Spice made aware, will refer for biopsy of lymph node.

## 2016-09-26 ENCOUNTER — Other Ambulatory Visit: Payer: Self-pay | Admitting: Oncology

## 2016-09-26 DIAGNOSIS — C679 Malignant neoplasm of bladder, unspecified: Secondary | ICD-10-CM

## 2016-10-03 ENCOUNTER — Other Ambulatory Visit: Payer: Self-pay | Admitting: Radiology

## 2016-10-05 ENCOUNTER — Ambulatory Visit (HOSPITAL_COMMUNITY)
Admission: RE | Admit: 2016-10-05 | Discharge: 2016-10-05 | Disposition: A | Discharge status: Home / Self Care | Payer: Medicare Other | Source: Ambulatory Visit | Attending: Oncology | Admitting: Oncology

## 2016-10-05 ENCOUNTER — Ambulatory Visit: Payer: Medicare Other

## 2016-10-05 ENCOUNTER — Encounter (HOSPITAL_COMMUNITY): Payer: Self-pay

## 2016-10-05 DIAGNOSIS — N189 Chronic kidney disease, unspecified: Secondary | ICD-10-CM | POA: Diagnosis not present

## 2016-10-05 DIAGNOSIS — Z79899 Other long term (current) drug therapy: Secondary | ICD-10-CM | POA: Insufficient documentation

## 2016-10-05 DIAGNOSIS — I129 Hypertensive chronic kidney disease with stage 1 through stage 4 chronic kidney disease, or unspecified chronic kidney disease: Secondary | ICD-10-CM | POA: Insufficient documentation

## 2016-10-05 DIAGNOSIS — Z96652 Presence of left artificial knee joint: Secondary | ICD-10-CM | POA: Insufficient documentation

## 2016-10-05 DIAGNOSIS — Z9049 Acquired absence of other specified parts of digestive tract: Secondary | ICD-10-CM | POA: Insufficient documentation

## 2016-10-05 DIAGNOSIS — R590 Localized enlarged lymph nodes: Secondary | ICD-10-CM | POA: Diagnosis present

## 2016-10-05 DIAGNOSIS — I341 Nonrheumatic mitral (valve) prolapse: Secondary | ICD-10-CM | POA: Diagnosis not present

## 2016-10-05 DIAGNOSIS — Z906 Acquired absence of other parts of urinary tract: Secondary | ICD-10-CM | POA: Diagnosis not present

## 2016-10-05 DIAGNOSIS — M1990 Unspecified osteoarthritis, unspecified site: Secondary | ICD-10-CM | POA: Insufficient documentation

## 2016-10-05 DIAGNOSIS — Z8551 Personal history of malignant neoplasm of bladder: Secondary | ICD-10-CM | POA: Insufficient documentation

## 2016-10-05 DIAGNOSIS — Z90710 Acquired absence of both cervix and uterus: Secondary | ICD-10-CM | POA: Insufficient documentation

## 2016-10-05 DIAGNOSIS — Z923 Personal history of irradiation: Secondary | ICD-10-CM | POA: Insufficient documentation

## 2016-10-05 DIAGNOSIS — C774 Secondary and unspecified malignant neoplasm of inguinal and lower limb lymph nodes: Secondary | ICD-10-CM | POA: Insufficient documentation

## 2016-10-05 DIAGNOSIS — Z9221 Personal history of antineoplastic chemotherapy: Secondary | ICD-10-CM | POA: Diagnosis not present

## 2016-10-05 DIAGNOSIS — Z7982 Long term (current) use of aspirin: Secondary | ICD-10-CM | POA: Insufficient documentation

## 2016-10-05 DIAGNOSIS — Z853 Personal history of malignant neoplasm of breast: Secondary | ICD-10-CM | POA: Insufficient documentation

## 2016-10-05 DIAGNOSIS — Z7951 Long term (current) use of inhaled steroids: Secondary | ICD-10-CM | POA: Insufficient documentation

## 2016-10-05 DIAGNOSIS — C679 Malignant neoplasm of bladder, unspecified: Secondary | ICD-10-CM

## 2016-10-05 DIAGNOSIS — K219 Gastro-esophageal reflux disease without esophagitis: Secondary | ICD-10-CM | POA: Insufficient documentation

## 2016-10-05 LAB — BASIC METABOLIC PANEL
Anion gap: 7 (ref 5–15)
BUN: 17 mg/dL (ref 6–20)
CO2: 23 mmol/L (ref 22–32)
CREATININE: 1.09 mg/dL — AB (ref 0.44–1.00)
Calcium: 9 mg/dL (ref 8.9–10.3)
Chloride: 109 mmol/L (ref 101–111)
GFR calc Af Amer: 55 mL/min — ABNORMAL LOW (ref >60)
GFR calc non Af Amer: 48 mL/min — ABNORMAL LOW (ref >60)
Glucose, Bld: 97 mg/dL (ref 65–99)
POTASSIUM: 3.7 mmol/L (ref 3.5–5.1)
Sodium: 139 mmol/L (ref 135–145)

## 2016-10-05 LAB — CBC WITH DIFFERENTIAL/PLATELET
BASOS ABS: 0 10*3/uL (ref 0.0–0.1)
Basophils Relative: 0 %
EOS ABS: 0.2 10*3/uL (ref 0.0–0.7)
EOS PCT: 3 %
HCT: 36.8 % (ref 36.0–46.0)
Hemoglobin: 11.9 g/dL — ABNORMAL LOW (ref 12.0–15.0)
LYMPHS ABS: 1.1 10*3/uL (ref 0.7–4.0)
LYMPHS PCT: 20 %
MCH: 31.7 pg (ref 26.0–34.0)
MCHC: 32.3 g/dL (ref 30.0–36.0)
MCV: 98.1 fL (ref 78.0–100.0)
MONO ABS: 0.4 10*3/uL (ref 0.1–1.0)
Monocytes Relative: 7 %
Neutro Abs: 3.9 10*3/uL (ref 1.7–7.7)
Neutrophils Relative %: 70 %
PLATELETS: 357 10*3/uL (ref 150–400)
RBC: 3.75 MIL/uL — ABNORMAL LOW (ref 3.87–5.11)
RDW: 13.8 % (ref 11.5–15.5)
WBC: 5.5 10*3/uL (ref 4.0–10.5)

## 2016-10-05 LAB — PROTIME-INR
INR: 0.89
Prothrombin Time: 12 seconds (ref 11.4–15.2)

## 2016-10-05 MED ORDER — MIDAZOLAM HCL 2 MG/2ML IJ SOLN
INTRAMUSCULAR | Status: AC
  Filled 2016-10-05: qty 4

## 2016-10-05 MED ORDER — SODIUM CHLORIDE 0.9 % IV SOLN
INTRAVENOUS | Status: DC
  Administered 2016-10-05: 11:00:00 via INTRAVENOUS

## 2016-10-05 MED ORDER — FENTANYL CITRATE (PF) 100 MCG/2ML IJ SOLN
INTRAMUSCULAR | Status: AC | PRN
  Administered 2016-10-05 (×2): 25 ug via INTRAVENOUS
  Administered 2016-10-05: 50 ug via INTRAVENOUS

## 2016-10-05 MED ORDER — MIDAZOLAM HCL 2 MG/2ML IJ SOLN
INTRAMUSCULAR | Status: AC | PRN
  Administered 2016-10-05 (×3): 1 mg via INTRAVENOUS

## 2016-10-05 MED ORDER — FENTANYL CITRATE (PF) 100 MCG/2ML IJ SOLN
INTRAMUSCULAR | Status: AC
  Filled 2016-10-05: qty 2

## 2016-10-05 NOTE — H&P (Signed)
Referring Physician(s): Ladell Pier  Supervising Physician: Jacqulynn Cadet  Patient Status:  [facility] OP  Chief Complaint:  "I'm having another biopsy"  Subjective: Patient familiar to IR service from prior right inguinal lymph node biopsy in September of last year. Pathology revealed metastatic urothelial carcinoma. She has also had prior PICC line placements in 2017 and in January of this year. She has a known history of sarcomatoid urothelial carcinoma with prior TURBT 2009 as well as cystectomy with neobladder 2009. Also with remote history of left breast cancer. She is status post chemoradiation.  CT from 09/15/16 revealed a persistently enlarged right inguinal  lymph node. She presents again today for ultrasound-guided right inguinal lymph node biopsy for further evaluation. She is currently asymptomatic. Past Medical History:  Diagnosis Date  . Arthritis   . 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 Bladder cancer ([place])   . Breast cancer ([place])   . Chronic kidney disease    does not empty bladder,self cath  . Complication of anesthesia   . GERD (gastroesophageal reflux disease)   . History of radiation therapy 05/02/16-06/07/16   pelvis 55 Gy in 25 fractions  . Hypertension   . Mitral valve prolapse   . PONV (postoperative nausea and vomiting)   . Self-catheterizes urinary bladder    Past Surgical History:  Procedure Laterality Date  . ABDOMINAL HYSTERECTOMY  1980   total  . ANTERIOR AND POSTERIOR REPAIR N/A 01/25/2016   Procedure: Excision of suburethral mass;  Surgeon: Irine Seal, MD;  Location: [facility];  Service: Urology;  Laterality: N/A;  . BLADDER SURGERY  2009   due to cancer  . BREAST SURGERY  2005   Left Breast Lumpectomy  . CHOLECYSTECTOMY N/A 02/19/2013   Procedure: LAPAROSCOPIC CHOLECYSTECTOMY WITH INTRAOPERATIVE CHOLANGIOGRAM;  Surgeon: Ralene Ok, MD;  Location: [facility];  Service: General;  Laterality: N/A;  . IR GENERIC HISTORICAL  05/03/2016   IR FLUORO GUIDE CV LINE RIGHT 05/03/2016 Corrie Mckusick, DO [facility]  . IR GENERIC HISTORICAL  05/03/2016   IR US GUIDE VASC ACCESS RIGHT 05/03/2016 Corrie Mckusick, DO [facility]  . IR GENERIC HISTORICAL  05/31/2016   IR FLUORO GUIDE CV LINE LEFT 05/31/2016 Sandi Mariscal, MD [facility]  . IR GENERIC HISTORICAL  05/31/2016   IR US GUIDE VASC ACCESS LEFT 05/31/2016 Sandi Mariscal, MD [facility]  . REPLACEMENT TOTAL KNEE Left       Allergies: Patient has no known allergies.  Medications: Prior to Admission medications   Medication Sig Start Date End Date Taking? Authorizing Provider  acetaminophen (TYLENOL) 325 MG tablet Take 650 mg by mouth every 6 (six) hours as needed for pain.   Yes [provider]  aspirin 81 MG tablet Take 81 mg by mouth daily.   Yes [provider]  Cholecalciferol (VITAMIN D3) 5000 units TABS Take by mouth.   Yes [provider]  diphenhydrAMINE (BENADRYL) 25 MG tablet Take 50 mg by mouth at bedtime.   Yes [provider]  escitalopram (LEXAPRO) 10 MG tablet Take 10 mg by mouth daily.   Yes [provider]  fluticasone (FLONASE) 50 MCG/ACT nasal spray Place 1 spray into the nose daily as needed for allergies.  06/22/12  Yes [provider]  loperamide (IMODIUM) 2 MG capsule Take 2 mg by mouth as needed for diarrhea or loose stools.   Yes [provider]  omeprazole (PRILOSEC) 20 MG capsule Take 20 mg by mouth daily.  06/25/12  Yes [provider]  VITAMIN B1-B12 IJ Inject as directed every 30 (thirty) days.   Yes [provider]  alendronate (FOSAMAX) 35 MG tablet Take 35 mg by mouth every 7 (seven) days. Take with a full glass of water on an empty stomach.    [provider]  amLODipine (NORVASC) 5 MG tablet  04/13/16   [provider]  lisinopril (PRINIVIL,ZESTRIL) 20 MG tablet Take 20 mg by mouth daily.    [provider]  prochlorperazine (COMPAZINE) 10 MG tablet  Take 1 tablet (10 mg total) by mouth every 6 (six) hours as needed for nausea or vomiting. Patient not taking: Reported on 07/07/2016 05/03/16   Ladell Pier, MD     Vital Signs: BP (!) 141/59   Pulse 80   Temp 97.9 F (36.6 C) (Oral)   Resp 15   Ht 5\' 2"  (1.575 m)   Wt 156 lb 8 oz (71 kg)   SpO2 99%   BMI 28.62 kg/m   Physical Exam awake, alert. Chest clear to auscultation bilaterally. Heart with regular rate and rhythm. Abdomen soft, positive bowel sounds, nontender. Mild right inguinal adenopathy. Significant lower extremity edema.  Imaging: No results found.  Labs:  CBC:  Recent Labs  06/09/16 0941 07/07/16 0954 09/08/16 0922 10/05/16 1105  WBC 4.8 6.6 6.3 5.5  HGB 10.5* 10.2* 10.9* 11.9*  HCT 32.6* 30.9* 34.4* 36.8  PLT 291 413* 337 357    COAGS:  Recent Labs  02/17/16 1100 10/05/16 1105  INR 1.02 0.89    BMP:  Recent Labs  01/26/16 0924 01/27/16 0517 02/17/16 1100  05/27/16 1135 06/09/16 0941 09/08/16 0922 10/05/16 1105  NA 133* 139 139  < > 139 137 141 139  K 4.7 5.6* 4.4  < > 4.4 4.3 3.5 3.7  CL 110 115* 107  --   --   --   --  109  CO2 18* 20* 21*  < > 24 22 24 23   GLUCOSE 109* 99 98  < > 89 102 87 97  BUN 52* 34* 24*  < > 16.9 29.9* 13.5 17  CALCIUM 8.2* 8.2* 9.4  < > 9.5 9.7 9.2 9.0  CREATININE 2.32* 1.57* 1.59*  < > 1.3* 1.4* 1.2* 1.09*  GFRNONAA 19* 31* 30*  --   --   --   --  48*  GFRAA 22* 36* 35*  --   --   --   --  55*  < > = values in this interval not displayed.  LIVER FUNCTION TESTS:  Recent Labs  04/07/16 1136 05/27/16 1135 06/09/16 0941  BILITOT 0.22 0.31 0.44  AST 16 29 14   ALT 11 29 8   ALKPHOS 52 66 56  PROT 7.5 7.1 7.1  ALBUMIN 3.4* 3.2* 3.2*    Assessment and Plan: Pt with remote history left breast cancer 2005 as well as sarcomatoid urothelial carcinoma with prior TURBT 2009 and cystectomy with neobladder 2009.   Status post right inguinal lymph node biopsy in September 2017 revealing metastatic  urothelial carcinoma. She is status post chemoradiation. CT from 09/15/16 revealed a persistently enlarged right inguinal  lymph node. She presents again today for ultrasound-guided right inguinal  lymph node biopsy for further evaluation.Risks and benefits discussed with the patient/spouse including, but not limited to bleeding, infection, damage to adjacent structures or low yield requiring additional tests. All of the patient's questions were answered, patient is agreeable to proceed. Consent signed and in chart.  Electronically Signed: D. Nash Mantis 10/05/2016, 12:09 PM   I spent a total of 15 minutes at the the patient's bedside AND on the patient's hospital floor or unit, greater than 50% of which was counseling/coordinating care for ultrasound-guided right inguinal lymph node biopsy

## 2016-10-05 NOTE — Discharge Instructions (Addendum)
Needle Biopsy, Care After °These instructions give you information about caring for yourself after your procedure. Your doctor may also give you more specific instructions. Call your doctor if you have any problems or questions after your procedure. °Follow these instructions at home: °· Rest as told by your doctor. °· Take medicines only as told by your doctor. °· There are many different ways to close and cover the biopsy site, including stitches (sutures), skin glue, and adhesive strips. Follow instructions from your doctor about: °¨ How to take care of your biopsy site. °¨ When and how you should change your bandage (dressing). °¨ When you should remove your dressing. °¨ Removing whatever was used to close your biopsy site. °· Check your biopsy site every day for signs of infection. Watch for: °¨ Redness, swelling, or pain. °¨ Fluid, blood, or pus. °Contact a doctor if: °· You have a fever. °· You have redness, swelling, or pain at the biopsy site, and it lasts longer than a few days. °· You have fluid, blood, or pus coming from the biopsy site. °· You feel sick to your stomach (nauseous). °· You throw up (vomit). °Get help right away if: °· You are short of breath. °· You have trouble breathing. °· Your chest hurts. °· You feel dizzy or you pass out (faint). °· You have bleeding that does not stop with pressure or a bandage. °· You cough up blood. °· Your belly (abdomen) hurts. °This information is not intended to replace advice given to you by your health care provider. Make sure you discuss any questions you have with your health care provider. °Document Released: 04/28/2008 Document Revised: 10/22/2015 Document Reviewed: 05/12/2014 °Elsevier Interactive Patient Education © 2017 [employer] ° ° °Moderate Conscious Sedation, Adult, Care After °These instructions provide you with information about caring for yourself after your procedure. Your health care provider may also give you more specific instructions.  Your treatment has been planned according to current medical practices, but problems sometimes occur. Call your health care provider if you have any problems or questions after your procedure. °What can I expect after the procedure? °After your procedure, it is common: °· To feel sleepy for several hours. °· To feel clumsy and have poor balance for several hours. °· To have poor judgment for several hours. °· To vomit if you eat too soon. °Follow these instructions at home: °For at least 24 hours after the procedure:  ° °· Do not: °¨ Participate in activities where you could fall or become injured. °¨ Drive. °¨ Use heavy machinery. °¨ Drink alcohol. °¨ Take sleeping pills or medicines that cause drowsiness. °¨ Make important decisions or sign legal documents. °¨ Take care of children on your own. °· Rest. °Eating and drinking  °· Follow the diet recommended by your health care provider. °· If you vomit: °¨ Drink water, juice, or soup when you can drink without vomiting. °¨ Make sure you have little or no nausea before eating solid foods. °General instructions  °· Have a responsible adult stay with you until you are awake and alert. °· Take over-the-counter and prescription medicines only as told by your health care provider. °· If you smoke, do not smoke without supervision. °· Keep all follow-up visits as told by your health care provider. This is important. °Contact a health care provider if: °· You keep feeling nauseous or you keep vomiting. °· You feel light-headed. °· You develop a rash. °· You have a fever. °Get help right   away if: °· You have trouble breathing. °This information is not intended to replace advice given to you by your health care provider. Make sure you discuss any questions you have with your health care provider. °Document Released: 03/06/2013 Document Revised: 10/19/2015 Document Reviewed: 09/05/2015 °Elsevier Interactive Patient Education © 2017 [employer] Inc[employer] ° °

## 2016-10-05 NOTE — Sedation Documentation (Signed)
Patient is resting comfortably. 

## 2016-10-05 NOTE — Procedures (Signed)
Interventional Radiology Procedure Note  Procedure: US guided bx of right inguinal LN.  Complications: None  Estimated Blood Loss: None  Recommendations: - Bedrest x 1 hr - DC home  Signed,  Criselda Peaches, MD

## 2016-10-07 ENCOUNTER — Telehealth: Payer: Self-pay

## 2016-10-07 ENCOUNTER — Telehealth: Payer: Self-pay | Admitting: *Deleted

## 2016-10-07 NOTE — Telephone Encounter (Signed)
Pt was asking if the possibility of surgery would be done here or in [place].

## 2016-10-07 NOTE — Telephone Encounter (Signed)
Pt returned call and Dr Gearldine Shown attached message given. She has an appt with Dr Benay Spice on 5/31 which corresponds to after her trip.

## 2016-10-07 NOTE — Telephone Encounter (Signed)
Pt called for biopsy results.

## 2016-10-07 NOTE — Telephone Encounter (Signed)
Returned call to pt, she would like to come in sooner to see Dr. Benay Spice. Appt given for 5/25. Message to schedulers.

## 2016-10-07 NOTE — Telephone Encounter (Signed)
-----   Message from Ladell Pier, MD sent at 10/06/2016  5:52 PM EDT ----- Please call patient Lymph node biopsy confirms mostly necrotic(dead) tumor in lymph node, but some persistent tumor cells are seen Schedule f/u for when she returns from trip Will need to discuss role for removing the lymph node with surgery

## 2016-10-21 ENCOUNTER — Telehealth: Payer: Self-pay | Admitting: *Deleted

## 2016-10-21 ENCOUNTER — Telehealth: Payer: Self-pay

## 2016-10-21 ENCOUNTER — Ambulatory Visit (HOSPITAL_BASED_OUTPATIENT_CLINIC_OR_DEPARTMENT_OTHER): Payer: Medicare Other | Admitting: Oncology

## 2016-10-21 ENCOUNTER — Encounter (INDEPENDENT_AMBULATORY_CARE_PROVIDER_SITE_OTHER): Payer: Self-pay

## 2016-10-21 ENCOUNTER — Other Ambulatory Visit: Payer: Self-pay | Admitting: *Deleted

## 2016-10-21 ENCOUNTER — Telehealth: Payer: Self-pay | Admitting: Oncology

## 2016-10-21 VITALS — BP 147/69 | HR 79 | Temp 97.7°F | Resp 18 | Wt 161.6 lb

## 2016-10-21 DIAGNOSIS — M25552 Pain in left hip: Secondary | ICD-10-CM | POA: Diagnosis not present

## 2016-10-21 DIAGNOSIS — M79659 Pain in unspecified thigh: Secondary | ICD-10-CM | POA: Diagnosis not present

## 2016-10-21 DIAGNOSIS — C774 Secondary and unspecified malignant neoplasm of inguinal and lower limb lymph nodes: Secondary | ICD-10-CM | POA: Diagnosis not present

## 2016-10-21 DIAGNOSIS — Z853 Personal history of malignant neoplasm of breast: Secondary | ICD-10-CM

## 2016-10-21 DIAGNOSIS — C679 Malignant neoplasm of bladder, unspecified: Secondary | ICD-10-CM

## 2016-10-21 DIAGNOSIS — M25551 Pain in right hip: Secondary | ICD-10-CM | POA: Diagnosis not present

## 2016-10-21 DIAGNOSIS — I10 Essential (primary) hypertension: Secondary | ICD-10-CM

## 2016-10-21 NOTE — Telephone Encounter (Signed)
Pt called that CT scan not scheduled yet. Contrast is needed so she is thinking lab Monday, CT Tues, MD Wednesday. Message forwarded to Dr Gearldine Shown nurse for prior auth to be submitted Tuesday 5/29.

## 2016-10-21 NOTE — Progress Notes (Signed)
[place] OFFICE PROGRESS NOTE   Diagnosis: Urothelial carcinoma  INTERVAL HISTORY:   Caitlin [name] returns as scheduled. She feels well. She just returned from a vacation. She has discomfort at the bilateral hip and upper thigh area. She relates this to beginning "wellchol "for treatment of diarrhea. She discontinued this medication earlier today. She continues to perform self catheterization without difficulty. A CT of the pelvis on 08/07/202018 revealed a persistent enlarged right inguinal node measuring 16 x 14 mm. She was referred for an ultrasound-guided biopsy of the right inguinal node on 10/05/2016. The pathology ([id number]) sees revealed necrosis involving the majority of core biopsies. Focal residual high-grade carcinoma was noted.  Objective:  Vital signs in last 24 hours:  Blood pressure (!) 147/69, pulse 79, temperature 97.7 F (36.5 C), temperature source Oral, resp. rate 18, weight 161 lb 9 oz (73.3 kg), SpO2 100 %.    HEENT: Neck without mass Lymphatics: No cervical, supraclavicular, axillary, or left inguinal nodes. There is slight fullness on deep palpation in the mid right inguinal canal without a discrete palpable lymph node. Resp: Lungs clear bilaterally Cardio: Regular rate and rhythm GI: No hepatosplenomegaly, no mass Vascular: No leg edema   Lab Results:  Lab Results  Component Value Date   WBC 5.5 10/05/2016   HGB 11.9 (L) 10/05/2016   HCT 36.8 10/05/2016   MCV 98.1 10/05/2016   PLT 357 10/05/2016   NEUTROABS 3.9 10/05/2016    CMP     Component Value Date/Time   NA 139 10/05/2016 1105   NA 141 09/08/2016 0922   K 3.7 10/05/2016 1105   K 3.5 09/08/2016 0922   CL 109 10/05/2016 1105   CO2 23 10/05/2016 1105   CO2 24 09/08/2016 0922   GLUCOSE 97 10/05/2016 1105   GLUCOSE 87 09/08/2016 0922   BUN 17 10/05/2016 1105   BUN 13.5 09/08/2016 0922   CREATININE 1.09 (H) 10/05/2016 1105   CREATININE 1.2 (H) 09/08/2016 0922   CALCIUM  9.0 10/05/2016 1105   CALCIUM 9.2 09/08/2016 0922   PROT 7.1 06/09/2016 0941   ALBUMIN 3.2 (L) 06/09/2016 0941   AST 14 06/09/2016 0941   ALT 8 06/09/2016 0941   ALKPHOS 56 06/09/2016 0941   BILITOT 0.44 06/09/2016 0941   GFRNONAA 48 (L) 10/05/2016 1105   GFRAA 55 (L) 10/05/2016 1105    Imaging:CT images 08/07/202018-reviewed    Medications: I have reviewed the patient's current medications.  Assessment/Plan: 1.Bladder carcinoma, sarcomatoid urothelial carcinoma-status post trans-urethral resection of a left bladder wall tumor in September 2009 ? Cystectomy, bilateral pelvic lymphadenectomy, and ileal neobladder 04/21/2008-no residual malignancy identified ? 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 Chest x-ray 01/29/2016-negative for metastatic disease ? CT abdomen/pelvis 02/02/2016-enlarged right inguinal lymph node with a borderline adjacent right common femoral node ? Biopsyof right inguinal lymph node on 02/17/2016 confirming metastatic carcinoma, pathology review at [facility] found the inguinal lymph node biopsy to be consistent with metastatic carcinoma similar to the urethral mass. ? PET scan at [facility] on 03/31/2016 revealed right external iliac and inguinal hypermetabolic lymph nodes and hypermetabolic activity associated with a sclerotic lesion at C3. MRI 04/12/2016 consistent with degenerative changes at C3-C4 ? Initiation of radiation 05/02/2016; completion of radiation 06/08/2015 ? Cycle 1 5-FU/mitomycin C 05/03/2016 ? Cycle 2 5-FU/mitomycin-C 06/03/2015 ? Radiation completed 06/07/2016 ? CT pelvis 08/07/202018-persistent enlarged right inguinal node ? Ultrasound-guided  biopsy of the right inguinal node 10/05/2016 revealed metastatic carcinoma with necrosis  2. Breast cancer, left T1b, N0-2005, status post lumpectomy/radiation and  Aromasin  3. Hypertension  4. 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 The right inguinal node is not clearly palpable on exam today. The biopsy on 10/05/2016 revealed a large degree of necrosis. It is possible the node is resolving. We discussed a referral to [facility] to consider an inguinal lymph node dissection.  We decided to obtain a repeat CT in one month. If there is a persistent enlarged lymph node I will refer her to the surgical oncology service at [facility].  The "hip "and upper thigh discomfort is most likely unrelated to the urothelial carcinoma or treatment. We will consider additional evaluation if the pain persists. She plans to follow-up with Dr. Dagmar Hait.  25 minutes were spent with the patient today. The majority of the time was used for counseling and coordination of care.  Betsy Coder, MD  10/21/2016  10:19 AM

## 2016-10-21 NOTE — Telephone Encounter (Signed)
Lab and follow up appointments schedule dper 10/21/16 los. Patient was given a copy of the AVS report and appointment schedule, per 10/21/16 los.

## 2016-10-21 NOTE — Telephone Encounter (Signed)
Call placed back to patient to notify her that CT will not be scheduled until prior auth has been complete.  Patient requested that lab appt on 11/22/16 be moved to 11/21/16.  Message sent to scheduling.  Patient appreciative of call back.

## 2016-10-25 ENCOUNTER — Ambulatory Visit: Payer: Medicare Other | Admitting: Oncology

## 2016-10-25 ENCOUNTER — Ambulatory Visit
Admission: RE | Admit: 2016-10-25 | Discharge: 2016-10-25 | Disposition: A | Discharge status: Home / Self Care | Payer: Medicare Other | Source: Ambulatory Visit | Attending: Internal Medicine | Admitting: Internal Medicine

## 2016-10-25 DIAGNOSIS — Z1231 Encounter for screening mammogram for malignant neoplasm of breast: Secondary | ICD-10-CM

## 2016-10-25 HISTORY — DX: Personal history of irradiation: Z92.3

## 2016-10-27 ENCOUNTER — Ambulatory Visit: Payer: Medicare Other | Admitting: Oncology

## 2016-11-04 ENCOUNTER — Telehealth: Payer: Self-pay

## 2016-11-04 ENCOUNTER — Telehealth: Payer: Self-pay | Admitting: *Deleted

## 2016-11-04 NOTE — Telephone Encounter (Signed)
Spoke with patient and she is aware of her new lab appt per inbox message  Kollin Udell

## 2016-11-04 NOTE — Telephone Encounter (Signed)
Message received from patient requesting date for CT scan.  Message sent to N. Singleton regarding PA not yet complete.  Call placed back to patient to inform her that prior auth has not been complete and that central scheduling will call her when PA is complete.  Patient appreciative of call back and has no further questions at this time.

## 2016-11-21 ENCOUNTER — Other Ambulatory Visit (HOSPITAL_BASED_OUTPATIENT_CLINIC_OR_DEPARTMENT_OTHER): Payer: Medicare Other

## 2016-11-21 ENCOUNTER — Ambulatory Visit (HOSPITAL_COMMUNITY)
Admission: RE | Admit: 2016-11-21 | Discharge: 2016-11-21 | Disposition: A | Discharge status: Home / Self Care | Payer: Medicare Other | Source: Ambulatory Visit | Attending: Oncology | Admitting: Oncology

## 2016-11-21 DIAGNOSIS — R590 Localized enlarged lymph nodes: Secondary | ICD-10-CM | POA: Insufficient documentation

## 2016-11-21 DIAGNOSIS — K5730 Diverticulosis of large intestine without perforation or abscess without bleeding: Secondary | ICD-10-CM | POA: Diagnosis not present

## 2016-11-21 DIAGNOSIS — C774 Secondary and unspecified malignant neoplasm of inguinal and lower limb lymph nodes: Secondary | ICD-10-CM | POA: Diagnosis not present

## 2016-11-21 DIAGNOSIS — C679 Malignant neoplasm of bladder, unspecified: Secondary | ICD-10-CM

## 2016-11-21 DIAGNOSIS — I700 Atherosclerosis of aorta: Secondary | ICD-10-CM | POA: Insufficient documentation

## 2016-11-21 LAB — BASIC METABOLIC PANEL
ANION GAP: 12 meq/L — AB (ref 3–11)
BUN: 22.7 mg/dL (ref 7.0–26.0)
CO2: 24 mEq/L (ref 22–29)
Calcium: 10 mg/dL (ref 8.4–10.4)
Chloride: 107 mEq/L (ref 98–109)
Creatinine: 1.4 mg/dL — ABNORMAL HIGH (ref 0.6–1.1)
EGFR: 35 mL/min/{1.73_m2} — AB (ref >90)
GLUCOSE: 91 mg/dL (ref 70–140)
POTASSIUM: 4.5 meq/L (ref 3.5–5.1)
Sodium: 143 mEq/L (ref 136–145)

## 2016-11-21 MED ORDER — IOPAMIDOL (ISOVUE-300) INJECTION 61%
75.0000 mL | Freq: Once | INTRAVENOUS | Status: AC | PRN
  Administered 2016-11-21: 60 mL via INTRAVENOUS

## 2016-11-22 ENCOUNTER — Other Ambulatory Visit: Payer: Medicare Other

## 2016-11-23 ENCOUNTER — Ambulatory Visit (HOSPITAL_BASED_OUTPATIENT_CLINIC_OR_DEPARTMENT_OTHER): Payer: Medicare Other | Admitting: Oncology

## 2016-11-23 ENCOUNTER — Telehealth: Payer: Self-pay | Admitting: Oncology

## 2016-11-23 ENCOUNTER — Ambulatory Visit (HOSPITAL_BASED_OUTPATIENT_CLINIC_OR_DEPARTMENT_OTHER): Payer: Medicare Other

## 2016-11-23 VITALS — BP 163/60 | HR 77 | Temp 97.7°F | Resp 18 | Ht 62.0 in | Wt 160.5 lb

## 2016-11-23 DIAGNOSIS — C774 Secondary and unspecified malignant neoplasm of inguinal and lower limb lymph nodes: Secondary | ICD-10-CM

## 2016-11-23 DIAGNOSIS — M25551 Pain in right hip: Secondary | ICD-10-CM | POA: Diagnosis not present

## 2016-11-23 DIAGNOSIS — C679 Malignant neoplasm of bladder, unspecified: Secondary | ICD-10-CM | POA: Diagnosis not present

## 2016-11-23 DIAGNOSIS — I10 Essential (primary) hypertension: Secondary | ICD-10-CM | POA: Diagnosis not present

## 2016-11-23 DIAGNOSIS — M25552 Pain in left hip: Secondary | ICD-10-CM

## 2016-11-23 DIAGNOSIS — Z853 Personal history of malignant neoplasm of breast: Secondary | ICD-10-CM

## 2016-11-23 LAB — BASIC METABOLIC PANEL
Anion Gap: 8 mEq/L (ref 3–11)
BUN: 22.5 mg/dL (ref 7.0–26.0)
CO2: 24 mEq/L (ref 22–29)
Calcium: 9.4 mg/dL (ref 8.4–10.4)
Chloride: 106 mEq/L (ref 98–109)
Creatinine: 1.3 mg/dL — ABNORMAL HIGH (ref 0.6–1.1)
EGFR: 39 mL/min/{1.73_m2} — ABNORMAL LOW (ref >90)
Glucose: 92 mg/dl (ref 70–140)
Potassium: 4.2 mEq/L (ref 3.5–5.1)
Sodium: 139 mEq/L (ref 136–145)

## 2016-11-23 NOTE — Telephone Encounter (Signed)
Lab added for today, per 11/23/16 los. Lab and follow up scheduled for September, per 11/23/16 los. Patient was given a copy of the AVS report and appointment schedule, per 11/23/16 los.

## 2016-11-23 NOTE — Progress Notes (Signed)
[place] OFFICE PROGRESS NOTE   Diagnosis: Urothelial carcinoma  INTERVAL HISTORY:   Caitlin [name] returns as scheduled. She cannot palpate a lymph node in the right inguinal region. She continues to have discomfort at the bilateral "hip ". The pain is intermittent. The pain improves after ambulation. The pain is worse at night.    Objective:  Vital signs in last 24 hours:  Blood pressure (!) 163/60, pulse 77, temperature 97.7 F (36.5 C), temperature source Oral, resp. rate 18, height 5\' 2"  (1.575 m), weight 160 lb 8 oz (72.8 kg), SpO2 98 %.    HEENT: Neck without mass Lymphatics: No cervical, supraclavicular, or axillary nodes. No left inguinal nodes. No discrete right inguinal node. Resp: Lungs clear bilaterally Cardio: Regular rate and rhythm GI: No hepatosplenomegaly, nontender Vascular: No leg edema Musculoskeletal: No pain with motion at the hip bilaterally. Tender over the posterior mid to lateral iliac region bilaterally. Tender at the bilateral trochanter    Lab Results:  Lab Results  Component Value Date   WBC 5.5 10/05/2016   HGB 11.9 (L) 10/05/2016   HCT 36.8 10/05/2016   MCV 98.1 10/05/2016   PLT 357 10/05/2016   NEUTROABS 3.9 10/05/2016    CMP     Component Value Date/Time   NA 143 11/21/2016 0821   K 4.5 11/21/2016 0821   CL 109 10/05/2016 1105   CO2 24 11/21/2016 0821   GLUCOSE 91 11/21/2016 0821   BUN 22.7 11/21/2016 0821   CREATININE 1.4 (H) 11/21/2016 0821   CALCIUM 10.0 11/21/2016 0821   PROT 7.1 06/09/2016 0941   ALBUMIN 3.2 (L) 06/09/2016 0941   AST 14 06/09/2016 0941   ALT 8 06/09/2016 0941   ALKPHOS 56 06/09/2016 0941   BILITOT 0.44 06/09/2016 0941   GFRNONAA 48 (L) 10/05/2016 1105   GFRAA 55 (L) 10/05/2016 1105    No results found for: CEA1  Lab Results  Component Value Date   INR 0.89 10/05/2016    Imaging:  Ct Pelvis W Contrast  Result Date: 11/21/2016 CLINICAL DATA:  Followup metastatic bladder  carcinoma to right inguinal lymph nodes. Status post chemotherapy. EXAM: CT PELVIS WITH CONTRAST TECHNIQUE: Multidetector CT imaging of the pelvis was performed using the standard protocol following the bolus administration of intravenous contrast. CONTRAST:  70mL ISOVUE-300 IOPAMIDOL (ISOVUE-300) INJECTION 61% COMPARISON:  Feb 13, 202015 FINDINGS: Urinary Tract: Previously cystectomy with stable appearance of pelvic neobladder. Bowel: Mild sigmoid diverticulosis, without evidence of diverticulitis. Vascular/Lymphatic: Interval decrease in size of right inguinal lymph node, currently measuring 1.3 x 0.9 cm on image 32/2 compared to 1.6 x 1.4 cm previously. No new or progressive lymphadenopathy identified within the pelvis. Distal aortic and bilateral iliac atherosclerotic calcification noted. Reproductive: Prior hysterectomy noted. Adnexal regions are unremarkable in appearance. Other: None. Musculoskeletal: No significant abnormality identified. IMPRESSION: Interval decrease in mild right inguinal lymphadenopathy since prior study. No new or progressive pelvic metastatic disease identified. Colonic diverticulosis. No radiographic evidence of diverticulitis. Aortic Atherosclerosis (ICD10-I70.0). Electronically Signed   By: Earle Gell M.D.   On: 11/21/2016 13:21  CT images reviewed  Medications: I have reviewed the patient's current medications.  Assessment/Plan: 1.Bladder carcinoma, sarcomatoid urothelial carcinoma-status post trans-urethral resection of a left bladder wall tumor in September 2009 ? Cystectomy, bilateral pelvic lymphadenectomy, and ileal neobladder 04/21/2008-no residual malignancy identified ? 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 Chest x-ray 01/29/2016-negative  for metastatic disease ? CT abdomen/pelvis 02/02/2016-enlarged right inguinal  lymph node with a borderline adjacent right common femoral node ? Biopsyof right inguinal lymph node on 02/17/2016 confirming metastatic carcinoma, pathology review at [facility] found the inguinal lymph node biopsy to be consistent with metastatic carcinoma similar to the urethral mass. ? PET scan at [facility] on 03/31/2016 revealed right external iliac and inguinal hypermetabolic lymph nodes and hypermetabolic activity associated with a sclerotic lesion at C3. MRI 04/12/2016 consistent with degenerative changes at C3-C4 ? Initiation of radiation 05/02/2016; completion of radiation 06/08/2015 ? Cycle 1 5-FU/mitomycin C 05/03/2016 ? Cycle 2 5-FU/mitomycin-C 06/03/2015 ? Radiation completed 06/07/2016 ? CT pelvis 2020/01/817-persistent enlarged right inguinal node ? Ultrasound-guided biopsy of the right inguinal node 10/05/2016 revealed metastatic carcinoma with necrosis ? CT pelvis 11/21/2017-decrease in the size of the right inguinal lymph node  2.Breast cancer, left T1b, N0-2005, status post lumpectomy/radiation and Aromasin  3. 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 The restaging CT reveals a decrease in the size of the right inguinal lymph node and no evidence of progressive disease. I will discuss the case with Dr.Kinard. I favor observation with a repeat pelvic CT in 2-3 months.  She will return for an office visit after the restaging CT in early September.  I suspect the "hip "discomfort is related to a benign musculoskeletal condition. She plans to schedule an appointment with her orthopedic physician.  25 minutes were spent with the patient today. The majority of the time was used for counseling and coordination of care.   Donneta Romberg, MD  11/23/2016  9:37 AM

## 2016-11-24 ENCOUNTER — Telehealth: Payer: Self-pay | Admitting: *Deleted

## 2016-11-24 NOTE — Telephone Encounter (Signed)
-----   Message from Ladell Pier, MD sent at 11/23/2016  9:40 PM EDT ----- Please call patient, creatinine is stable, f/u as scheduled

## 2016-11-29 IMAGING — CT CT CHEST W/O CM
2 of 4 series · 15 of 36 positions shown, 18 images · non-contrast
Comparison: 02/02/2016 CT abdomen/ pelvis. 01/29/2016 chest
radiograph.

CLINICAL DATA: Staging recurrent bladder cancer diagnosed
01/26/2016.

EXAM:
CT CHEST WITHOUT CONTRAST
TECHNIQUE: Multidetector CT imaging of the chest was performed following the
standard protocol without IV contrast.

[Series 2: chest w/o st · axial · non-contrast · 0.70mm/px · z∈[-242,+8]mm · 12 of 149 slices shown, 15 images]
[im 12/149  mediastinal]
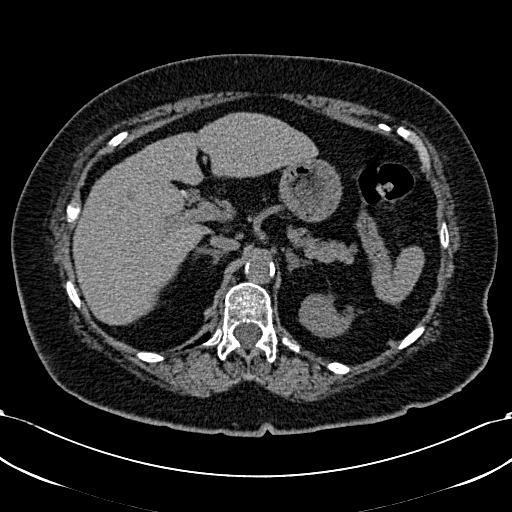
[im 12/149  lung]
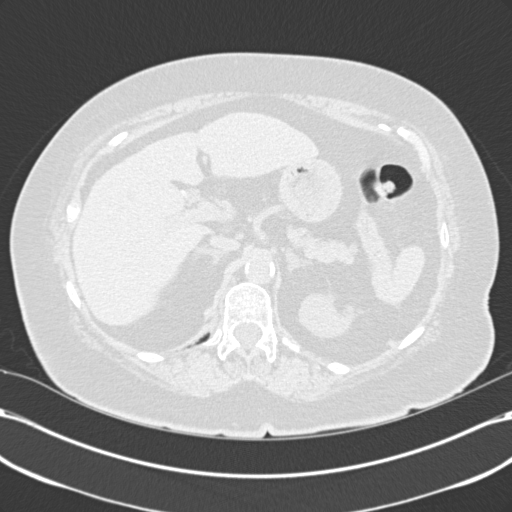
[im 23/149  lung]
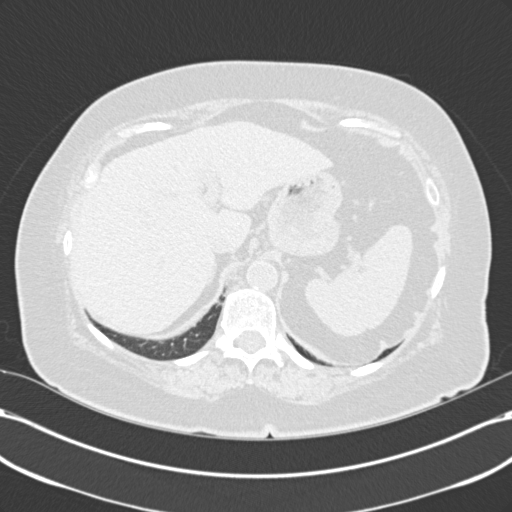
[im 35/149  lung]
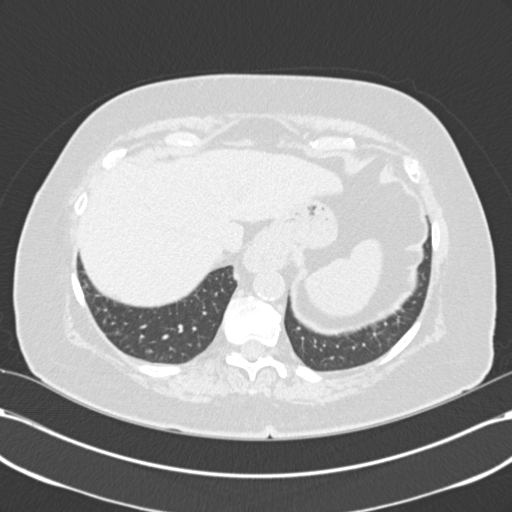
[im 46/149  lung]
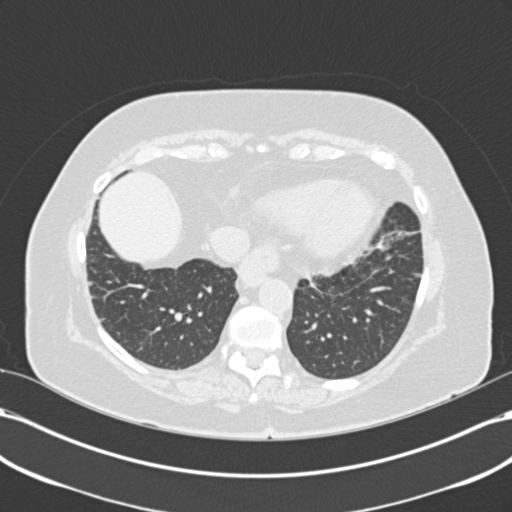
[im 57/149  mediastinal]
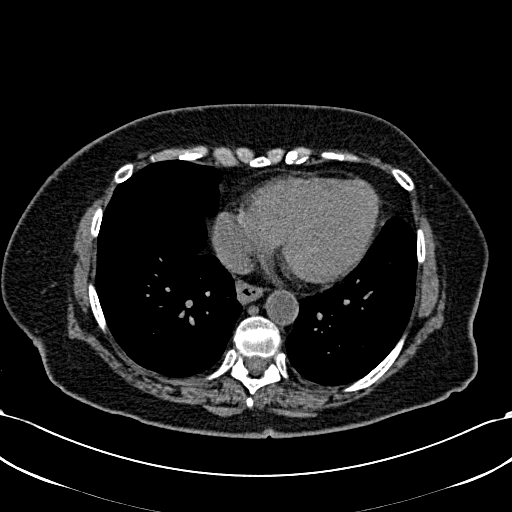
[im 57/149  lung]
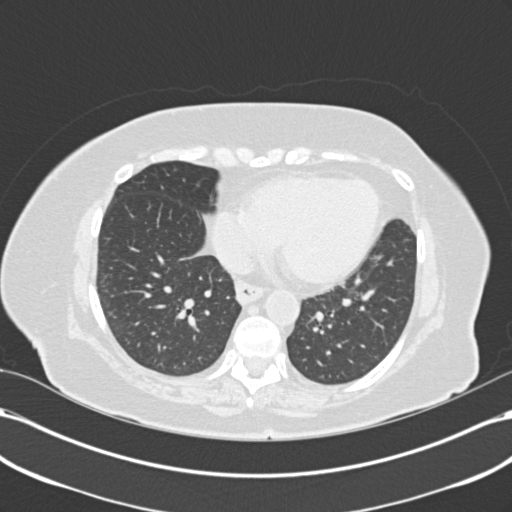
[im 69/149  lung]
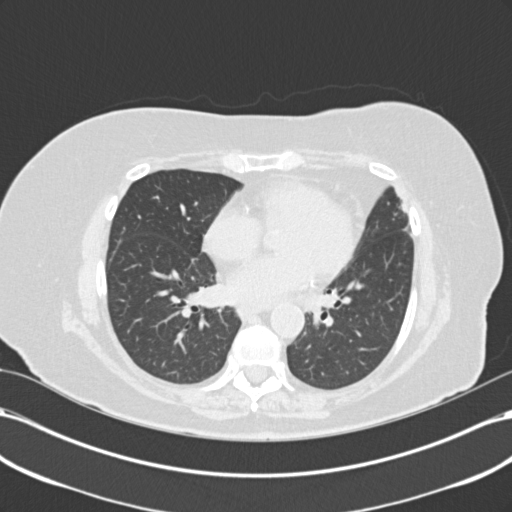
[im 80/149  lung]
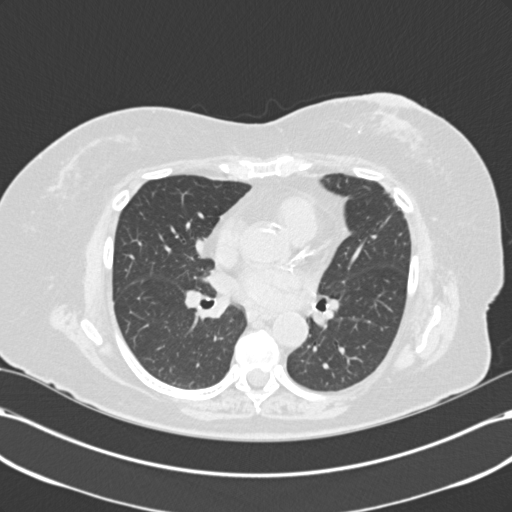
[im 92/149  lung]
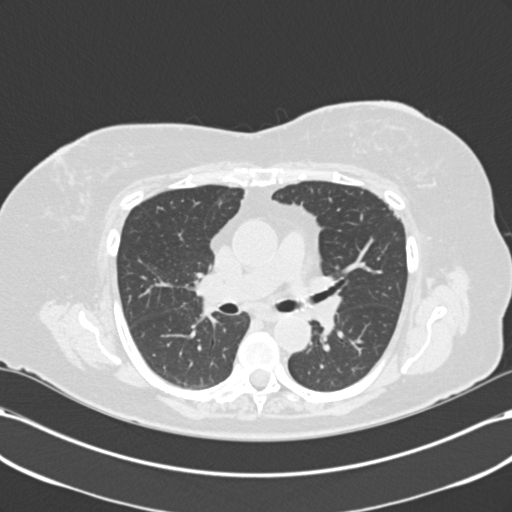
[im 103/149  mediastinal]
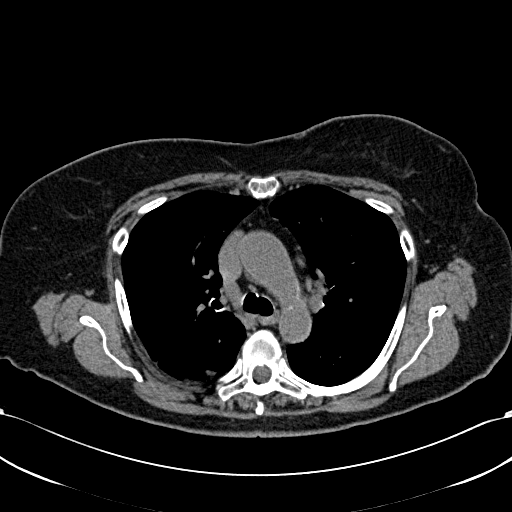
[im 103/149  lung]
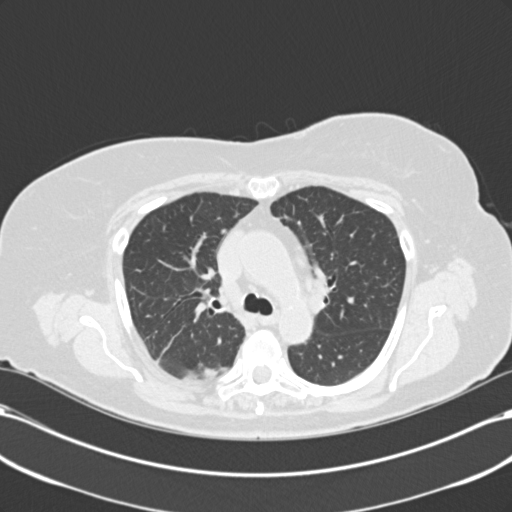
[im 114/149  lung]
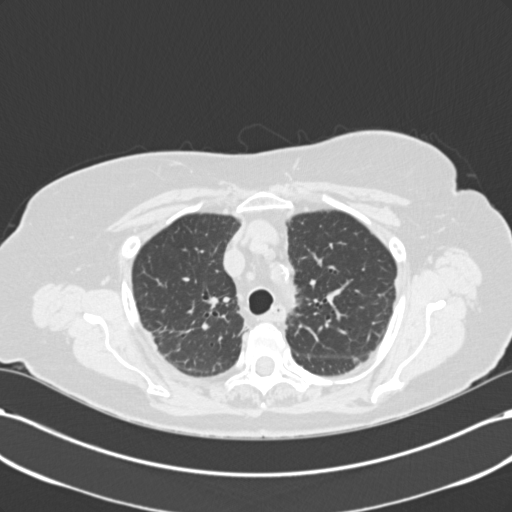
[im 126/149  lung]
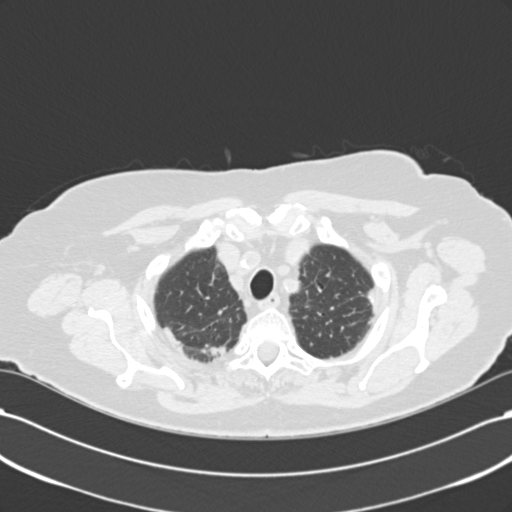
[im 137/149  lung]
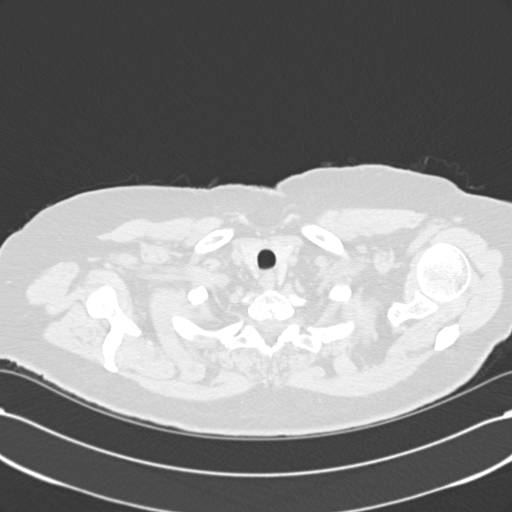

[Series 602: coronal · coronal · 0.70mm/px · 3 of 118 slices shown]
[im 24/118  lung]
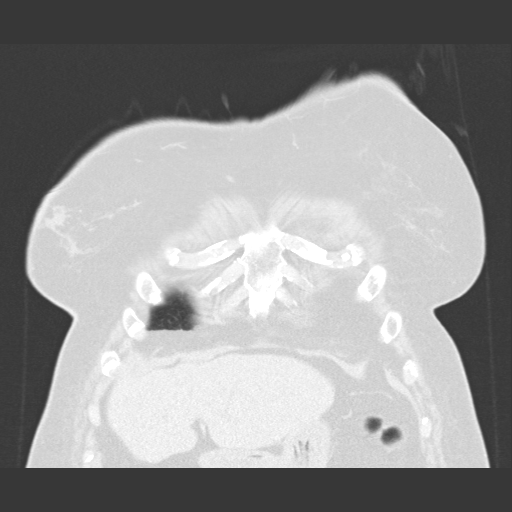
[im 47/118  lung]
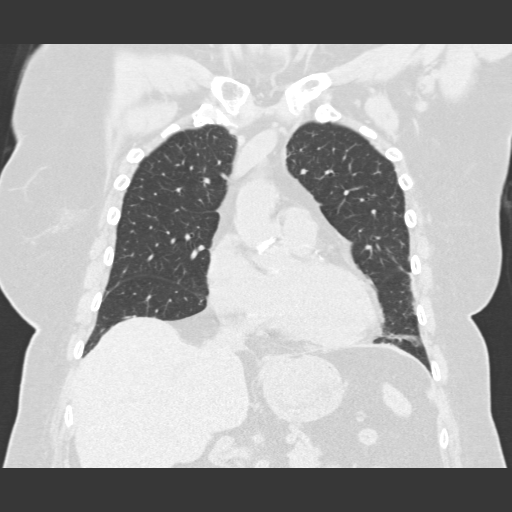
[im 71/118  lung]
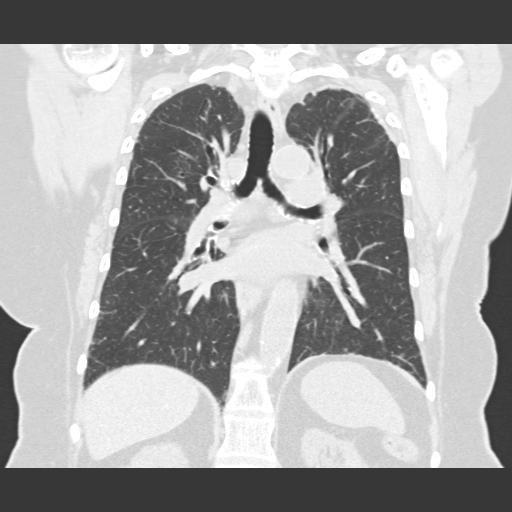

[15 of 36 positions shown; findings below may reference images not displayed]

FINDINGS: Cardiovascular: Normal heart size. No significant pericardial
fluid/thickening. Left main, left anterior descending, left
circumflex and right coronary atherosclerosis. Atherosclerotic
nonaneurysmal thoracic aorta. Normal caliber pulmonary arteries.

Mediastinum/Nodes: No discrete thyroid nodules. Unremarkable
esophagus. No pathologically enlarged axillary, mediastinal or gross
hilar lymph nodes, noting limited sensitivity for the detection of
hilar adenopathy on this noncontrast study.

Lungs/Pleura: No pneumothorax. No pleural effusion. Bandlike
subpleural consolidation with associated calcifications at both lung
apices, consistent with mild pleural-parenchymal scarring. Left
lower lobe 4 mm pulmonary nodule (series 5/ image 109) appears
stable back to the 03/13/2012, most consistent with a benign
etiology. Nonspecific patchy mild subpleural reticulation throughout
both lungs is not appreciably changed at the lung bases back to
07/15/2011. No acute consolidative airspace disease, lung masses or
additional significant pulmonary nodules.

Upper abdomen: Small to moderate hiatal hernia.

Musculoskeletal: No aggressive appearing focal osseous lesions.
Minimal thoracic spondylosis.
IMPRESSION: 1. No evidence of metastatic disease in the chest.
2. Aortic atherosclerosis. Left main and 3 vessel coronary
atherosclerosis.
3. Small to moderate hiatal hernia.

## 2016-12-15 ENCOUNTER — Encounter: Payer: Self-pay | Admitting: Radiation Oncology

## 2016-12-15 ENCOUNTER — Ambulatory Visit
Admission: RE | Admit: 2016-12-15 | Discharge: 2016-12-15 | Disposition: A | Discharge status: Home / Self Care | Payer: Medicare Other | Source: Ambulatory Visit | Attending: Radiation Oncology | Admitting: Radiation Oncology

## 2016-12-15 DIAGNOSIS — C679 Malignant neoplasm of bladder, unspecified: Secondary | ICD-10-CM | POA: Diagnosis not present

## 2016-12-15 NOTE — Progress Notes (Signed)
Caitlin [name] is here for follow up after treatment to her pelvis.  She reports having pain in her lower back for the past week.  She thinks she hurt it while working at [employer].  She continues to do self catherization.  She deines having any dysuria or hematuria.  She reports having occasional diarrhea and takes Imodium as needed.  She denies having fatigue and has a good appetite.  She denies having any skin irritation in her pelvic area.  BP (!) 123/57 (BP Location: Right Arm, Patient Position: Sitting)   Pulse 87   Temp 98.1 F (36.7 C) (Oral)   Ht 5\' 2"  (1.575 m)   Wt 161 lb (73 kg)   SpO2 99%   BMI 29.45 kg/m    Wt Readings from Last 3 Encounters:  12/15/16 161 lb (73 kg)  11/23/16 160 lb 8 oz (72.8 kg)  10/21/16 161 lb 9 oz (73.3 kg)

## 2016-12-15 NOTE — Progress Notes (Signed)
Radiation Oncology         [phone] ________________________________  Name: Caitlin [name] MRN: [id number]  Date: 12/15/2016  DOB: 18-Mar-1941  Follow-Up Visit Note  CC: Prince Solian, MD  Ladell Pier, MD    ICD-10-CM   1. Bladder carcinoma [facility]) C67.9     Diagnosis:   Urothelial carcinoma, infiltrating high grade urothelial carcinoma with squamous cell features (80%) with right inguinal and right external iliac lymphadenopathy  Interval Since Last Radiation:  6 months 05/02/16-06/07/16 55 Gy to the pelvis in 25 fractions  Narrative:  The patient returns today for routine follow-up and review of rceent imaging.  The patient underwent CT pelvis on 11/21/16 which showed interval decrease in mild right inguinal lymphadenopathy since prior study. No new or progressive pelvic metastatic disease identified.  On review of systems, the patient reports having pain in her lower back for the past week. She thinks she injured it working at [employer]. She reports she has followed up with her PCP about this, and is due to see an orthopedic doctor this month. She is doing exercises at home, which has helped. She does not take any prescribed or OTC medication for her pain. She continues to self catheterize. She denies dysuria or hematuria. She reports occasional diarrhea and uses Imodium as needed. She denies fatigue, and reports a good appetite. She denies any skin irritation in her pelvic area.                             ALLERGIES:  has No Known Allergies.  Meds: Current Outpatient Prescriptions  Medication Sig Dispense Refill  . acetaminophen (TYLENOL) 325 MG tablet Take 650 mg by mouth every 6 (six) hours as needed for pain.    [name] alendronate (FOSAMAX) 35 MG tablet Take 35 mg by mouth every 7 (seven) days. Take with a full glass of water on an empty stomach.    [name] amLODipine (NORVASC) 5 MG tablet     . aspirin 81 MG tablet Take 81 mg by mouth daily.    . Cholecalciferol  (VITAMIN D3) 5000 units TABS Take by mouth.    . escitalopram (LEXAPRO) 10 MG tablet Take 10 mg by mouth daily.    . fluticasone (FLONASE) 50 MCG/ACT nasal spray Place 1 spray into the nose daily as needed for allergies.     [name] loperamide (IMODIUM) 2 MG capsule Take 2 mg by mouth as needed for diarrhea or loose stools.    [name] omeprazole (PRILOSEC) 20 MG capsule Take 20 mg by mouth daily.     [name] VITAMIN B1-B12 IJ Inject as directed every 30 (thirty) days.    . diphenhydrAMINE (BENADRYL) 25 MG tablet Take 50 mg by mouth at bedtime.    . prochlorperazine (COMPAZINE) 10 MG tablet Take 1 tablet (10 mg total) by mouth every 6 (six) hours as needed for nausea or vomiting. (Patient not taking: Reported on 07/07/2016) 30 tablet 0   No current facility-administered medications for this encounter.     Physical Findings: The patient is in no acute distress. Patient is alert and oriented.  height is 5\' 2"  (1.575 m) and weight is 161 lb (73 kg). Her oral temperature is 98.1 F (36.7 C). Her blood pressure is 123/57 (abnormal) and her pulse is 87. Her oxygen saturation is 99%.  No significant changes.  Lungs are clear to auscultation bilaterally. Heart has regular rate and rhythm. No palpable cervical,  supraclavicular, or axillary adenopathy. No inguinal adenopathy. Abdomen soft, non-tender, normal bowel sounds. No edema in extremities.  On pelvic exam No mucosal lesions noted in the vaginal vault. No signs of recurrence along the periurethral area. On bimanual and rectovaginal exam, no masses appreciated.  Lab Findings: Lab Results  Component Value Date   WBC 5.5 10/05/2016   HGB 11.9 (L) 10/05/2016   HCT 36.8 10/05/2016   MCV 98.1 10/05/2016   PLT 357 10/05/2016    Radiographic Findings: Ct Pelvis W Contrast  Result Date: 11/21/2016 CLINICAL DATA:  Followup metastatic bladder carcinoma to right inguinal lymph nodes. Status post chemotherapy. EXAM: CT PELVIS WITH CONTRAST TECHNIQUE: Multidetector CT  imaging of the pelvis was performed using the standard protocol following the bolus administration of intravenous contrast. CONTRAST:  62mL ISOVUE-300 IOPAMIDOL (ISOVUE-300) INJECTION 61% COMPARISON:  04/20/202015 FINDINGS: Urinary Tract: Previously cystectomy with stable appearance of pelvic neobladder. Bowel: Mild sigmoid diverticulosis, without evidence of diverticulitis. Vascular/Lymphatic: Interval decrease in size of right inguinal lymph node, currently measuring 1.3 x 0.9 cm on image 32/2 compared to 1.6 x 1.4 cm previously. No new or progressive lymphadenopathy identified within the pelvis. Distal aortic and bilateral iliac atherosclerotic calcification noted. Reproductive: Prior hysterectomy noted. Adnexal regions are unremarkable in appearance. Other: None. Musculoskeletal: No significant abnormality identified. IMPRESSION: Interval decrease in mild right inguinal lymphadenopathy since prior study. No new or progressive pelvic metastatic disease identified. Colonic diverticulosis. No radiographic evidence of diverticulitis. Aortic Atherosclerosis (ICD10-I70.0). Electronically Signed   By: Earle Gell M.D.   On: 11/21/2016 13:21    Impression:  No evidence of disease recurrence on clinical exam and recent CT.  Plan:  Patient will follow up with radiation oncology in 3 months.  -----------------------------------  Blair Promise, PhD, MD  This document serves as a record of services personally performed by Gery Pray, MD. It was created on his behalf by Maryla Morrow, a trained medical scribe. The creation of this record is based on the scribe's personal observations and the provider's statements to them. This document has been checked and approved by the attending provider.

## 2016-12-30 ENCOUNTER — Telehealth: Payer: Self-pay

## 2016-12-30 NOTE — Telephone Encounter (Signed)
Pt called asking if it is time for her prior auth for CT due 9/7. Pt offered to call back Monday to ask same question b/c it is a few days too early to get PA.

## 2017-01-03 ENCOUNTER — Telehealth: Payer: Self-pay | Admitting: *Deleted

## 2017-01-03 NOTE — Telephone Encounter (Signed)
"  Cancel the 01-09-2017 appointment with dr. Benay Spice.  I'm scheduled to see him after scans in September."  Appointment cancelled per patient request.  11-23-2016 LOS request F/U 1-2 days after scans.

## 2017-01-09 ENCOUNTER — Ambulatory Visit: Payer: Medicare Other | Admitting: Oncology

## 2017-01-25 ENCOUNTER — Ambulatory Visit (HOSPITAL_COMMUNITY)
Admission: RE | Admit: 2017-01-25 | Discharge: 2017-01-25 | Disposition: A | Discharge status: Home / Self Care | Payer: Medicare Other | Source: Ambulatory Visit | Attending: Oncology | Admitting: Oncology

## 2017-01-25 ENCOUNTER — Encounter (HOSPITAL_COMMUNITY): Payer: Self-pay

## 2017-01-25 DIAGNOSIS — C679 Malignant neoplasm of bladder, unspecified: Secondary | ICD-10-CM | POA: Insufficient documentation

## 2017-01-25 DIAGNOSIS — Z906 Acquired absence of other parts of urinary tract: Secondary | ICD-10-CM | POA: Diagnosis not present

## 2017-01-25 DIAGNOSIS — I7090 Unspecified atherosclerosis: Secondary | ICD-10-CM | POA: Diagnosis not present

## 2017-01-26 ENCOUNTER — Telehealth: Payer: Self-pay

## 2017-01-26 NOTE — Telephone Encounter (Signed)
Left message for pt that her recent CT scan looked good with no evidence of cancer.

## 2017-01-26 NOTE — Telephone Encounter (Signed)
Pt called for results of her CT scan yesterday.[phone]

## 2017-01-26 NOTE — Telephone Encounter (Signed)
-----   Message from Ladell Pier, MD sent at 01/25/2017  8:01 PM EDT ----- Please call patient, CT shows no evidence of cancer

## 2017-01-31 ENCOUNTER — Other Ambulatory Visit (HOSPITAL_BASED_OUTPATIENT_CLINIC_OR_DEPARTMENT_OTHER): Payer: Medicare Other

## 2017-01-31 DIAGNOSIS — C679 Malignant neoplasm of bladder, unspecified: Secondary | ICD-10-CM | POA: Diagnosis not present

## 2017-01-31 LAB — CBC WITH DIFFERENTIAL/PLATELET
BASO%: 0.2 % (ref 0.0–2.0)
BASOS ABS: 0 10*3/uL (ref 0.0–0.1)
EOS ABS: 0.2 10*3/uL (ref 0.0–0.5)
EOS%: 3.1 % (ref 0.0–7.0)
HCT: 36 % (ref 34.8–46.6)
HGB: 11.4 g/dL — ABNORMAL LOW (ref 11.6–15.9)
LYMPH%: 17.1 % (ref 14.0–49.7)
MCH: 31.1 pg (ref 25.1–34.0)
MCHC: 31.7 g/dL (ref 31.5–36.0)
MCV: 98.4 fL (ref 79.5–101.0)
MONO#: 0.4 10*3/uL (ref 0.1–0.9)
MONO%: 7.7 % (ref 0.0–14.0)
NEUT#: 3.8 10*3/uL (ref 1.5–6.5)
NEUT%: 71.9 % (ref 38.4–76.8)
Platelets: 341 10*3/uL (ref 145–400)
RBC: 3.66 10*6/uL — AB (ref 3.70–5.45)
RDW: 14.2 % (ref 11.2–14.5)
WBC: 5.2 10*3/uL (ref 3.9–10.3)
lymph#: 0.9 10*3/uL (ref 0.9–3.3)

## 2017-01-31 LAB — BASIC METABOLIC PANEL
ANION GAP: 9 meq/L (ref 3–11)
BUN: 19.3 mg/dL (ref 7.0–26.0)
CALCIUM: 9.5 mg/dL (ref 8.4–10.4)
CO2: 23 mEq/L (ref 22–29)
CREATININE: 1.4 mg/dL — AB (ref 0.6–1.1)
Chloride: 106 mEq/L (ref 98–109)
EGFR: 37 mL/min/{1.73_m2} — AB (ref >90)
Glucose: 127 mg/dl (ref 70–140)
Potassium: 4 mEq/L (ref 3.5–5.1)
SODIUM: 139 meq/L (ref 136–145)

## 2017-02-03 ENCOUNTER — Ambulatory Visit (HOSPITAL_BASED_OUTPATIENT_CLINIC_OR_DEPARTMENT_OTHER): Payer: Medicare Other | Admitting: Oncology

## 2017-02-03 ENCOUNTER — Telehealth: Payer: Self-pay | Admitting: Oncology

## 2017-02-03 VITALS — BP 145/62 | HR 65 | Temp 97.7°F | Resp 18 | Wt 163.5 lb

## 2017-02-03 DIAGNOSIS — C680 Malignant neoplasm of urethra: Secondary | ICD-10-CM | POA: Diagnosis not present

## 2017-02-03 DIAGNOSIS — Z853 Personal history of malignant neoplasm of breast: Secondary | ICD-10-CM

## 2017-02-03 DIAGNOSIS — I10 Essential (primary) hypertension: Secondary | ICD-10-CM

## 2017-02-03 DIAGNOSIS — C774 Secondary and unspecified malignant neoplasm of inguinal and lower limb lymph nodes: Secondary | ICD-10-CM | POA: Diagnosis not present

## 2017-02-03 NOTE — Telephone Encounter (Signed)
Gave patient AVS and calendar of upcoming January appointment.

## 2017-02-03 NOTE — Progress Notes (Signed)
[facility] OFFICE PROGRESS NOTE   Diagnosis: Urothelial carcinoma  INTERVAL HISTORY:   Caitlin [name] returns as scheduled. She feels well. No new complaint. The bilateral hip discomfort improved with steroid injection therapy for "bursitis ".  Objective:  Vital signs in last 24 hours:  Blood pressure (!) 145/62, pulse 65, temperature 97.7 F (36.5 C), temperature source Oral, resp. rate 18, weight 163 lb 8 oz (74.2 kg), SpO2 99 %.    HEENT: Neck without mass Lymphatics: No cervical, supraclavicular, axillary, or inguinal nodes. No masses palpated in the right groin. Resp: Lungs clear bilaterally Cardio: Regular rate and rhythm GI: No hepatosplenomegaly, no mass, nontender Vascular: No leg edema   Lab Results:  Lab Results  Component Value Date   WBC 5.2 01/31/2017   HGB 11.4 (L) 01/31/2017   HCT 36.0 01/31/2017   MCV 98.4 01/31/2017   PLT 341 01/31/2017   NEUTROABS 3.8 01/31/2017    CMP     Component Value Date/Time   NA 139 01/31/2017 0826   K 4.0 01/31/2017 0826   CL 109 10/05/2016 1105   CO2 23 01/31/2017 0826   GLUCOSE 127 01/31/2017 0826   BUN 19.3 01/31/2017 0826   CREATININE 1.4 (H) 01/31/2017 0826   CALCIUM 9.5 01/31/2017 0826   PROT 7.1 06/09/2016 0941   ALBUMIN 3.2 (L) 06/09/2016 0941   AST 14 06/09/2016 0941   ALT 8 06/09/2016 0941   ALKPHOS 56 06/09/2016 0941   BILITOT 0.44 06/09/2016 0941   GFRNONAA 48 (L) 10/05/2016 1105   GFRAA 55 (L) 10/05/2016 1105     Medications: I have reviewed the patient's current medications.  Assessment/Plan: 1.Bladder carcinoma, sarcomatoid urothelial carcinoma-status post trans-urethral resection of a left bladder wall tumor in September 2009 ? Cystectomy, bilateral pelvic lymphadenectomy, and ileal neobladder 04/21/2008-no residual malignancy identified ? Presentation with bleeding, urethral mass August 2017, status post resection of a subcutaneous urethral mass 01/25/2016 revealing high-grade  urothelial carcinoma with squamous cell features ? Chest x-ray 01/29/2016-negative for metastatic disease ? CT abdomen/pelvis 02/02/2016-enlarged right inguinal lymph node with a borderline adjacent right common femoral node ? Biopsyof right inguinal lymph node on 02/17/2016 confirming metastatic carcinoma, pathology review at [facility] found the inguinal lymph node biopsy to be consistent with metastatic carcinoma similar to the urethral mass. ? PET scan at [facility] on 03/31/2016 revealed right external iliac and inguinal hypermetabolic lymph nodes and hypermetabolic activity associated with a sclerotic lesion at C3. MRI 04/12/2016 consistent with degenerative changes at C3-C4 ? Initiation of radiation 05/02/2016; completion of radiation 06/08/2015 ? Cycle 1 5-FU/mitomycin C 05/03/2016 ? Cycle 2 5-FU/mitomycin-C 06/03/2015 ? Radiation completed 06/07/2016 ? CT pelvis August 25, 202018-persistent enlarged right inguinal node ? Ultrasound-guided biopsy of the right inguinal node 10/05/2016 revealed metastatic carcinoma with necrosis ? CT pelvis 11/21/2017-decrease in the size of the right inguinal lymph node ? CT pelvis 01/25/2017-no evidence of metastatic carcinoma  2.Breast cancer, left T1b, N0-2005, status post lumpectomy/radiation and Aromasin  3. 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 I reviewed the CT images from 01/25/2017. The right inguinal lymph node appears smaller.  She is scheduled to see Dr. Diona Fanti later this month and Dr.Kinard in October. She will return for an office visit in 4 months.  15 minutes were spent with the patient today. The majority of the time was used for counseling and coordination of care.  Donneta Romberg, MD  02/03/2017  8:27 AM

## 2017-02-08 ENCOUNTER — Encounter (HOSPITAL_COMMUNITY): Payer: Self-pay

## 2017-02-08 ENCOUNTER — Emergency Department (HOSPITAL_COMMUNITY)
Admission: EM | Admit: 2017-02-08 | Discharge: 2017-02-09 | Disposition: A | Discharge status: Home / Self Care | Payer: Medicare Other | Attending: Emergency Medicine | Admitting: Emergency Medicine

## 2017-02-08 ENCOUNTER — Emergency Department (HOSPITAL_COMMUNITY): Payer: Medicare Other

## 2017-02-08 DIAGNOSIS — S161XXA Strain of muscle, fascia and tendon at neck level, initial encounter: Secondary | ICD-10-CM | POA: Insufficient documentation

## 2017-02-08 DIAGNOSIS — S0990XA Unspecified injury of head, initial encounter: Secondary | ICD-10-CM | POA: Diagnosis present

## 2017-02-08 DIAGNOSIS — Y999 Unspecified external cause status: Secondary | ICD-10-CM | POA: Insufficient documentation

## 2017-02-08 DIAGNOSIS — W0110XA Fall on same level from slipping, tripping and stumbling with subsequent striking against unspecified object, initial encounter: Secondary | ICD-10-CM | POA: Insufficient documentation

## 2017-02-08 DIAGNOSIS — I10 Essential (primary) hypertension: Secondary | ICD-10-CM | POA: Diagnosis not present

## 2017-02-08 DIAGNOSIS — Z79899 Other long term (current) drug therapy: Secondary | ICD-10-CM | POA: Diagnosis not present

## 2017-02-08 DIAGNOSIS — Z7982 Long term (current) use of aspirin: Secondary | ICD-10-CM | POA: Diagnosis not present

## 2017-02-08 DIAGNOSIS — Y929 Unspecified place or not applicable: Secondary | ICD-10-CM | POA: Insufficient documentation

## 2017-02-08 DIAGNOSIS — Y939 Activity, unspecified: Secondary | ICD-10-CM | POA: Insufficient documentation

## 2017-02-08 NOTE — ED Provider Notes (Signed)
[facility] DEPT Provider Note   CSN: [id number] Arrival date & time: 02/08/17  1344     History   Chief Complaint Chief Complaint  Patient presents with  . Fall  . Head Injury    HPI Caitlin [name] is a 76 y.o. female.  HPI Caitlin [name] is a 76 y.o. female presents to emergency department complaining of a head injury. Patient states that she tripped and fell one week ago while trying to use her cane. She reports hitting the back of her head. Since then she reports headache and tenderness to palpation over her scalp. She denies any loss of consciousness, no nausea or vomiting, no dizziness, no confusion, memory loss. Patient states that she has a friend who recently had a head trauma and week later had a bleed. She states that she became concerned and decided to come to the emergency room for evaluation. Patient denies any trouble ambulating, noted weakness or numbness in extremities, she does report some neck pain as well.  Past Medical History:  Diagnosis Date  . Arthritis   . 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 Bladder cancer ([place])   . Breast cancer ([place])   . Chronic kidney disease    does not empty bladder,self cath  . Complication of anesthesia   . GERD (gastroesophageal reflux disease)   . History of radiation therapy 05/02/16-06/07/16   pelvis 55 Gy in 25 fractions  . Hypertension   . Mitral valve prolapse   . Personal history of radiation therapy 2005  . PONV (postoperative nausea and vomiting)   . Self-catheterizes urinary bladder     Patient Active Problem List   Diagnosis Date Noted  . 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 Hyperkalemia 01/27/2016  . Acute blood loss anemia 01/27/2016  . Acute renal insufficiency 01/26/2016  . Urethral caruncle 01/26/2016  . Urinary tract infection 01/25/2016  . PAC (premature atrial contraction) 11/29/2012  . DOE (dyspnea on exertion) 11/29/2012  . Orthostatic hypotension 10/10/2012  . Dizziness 10/10/2012  . UTI  (lower urinary tract infection) 10/10/2012  . HTN (hypertension) 10/10/2012    Past Surgical History:  Procedure Laterality Date  . ABDOMINAL HYSTERECTOMY  1980   total  . ANTERIOR AND POSTERIOR REPAIR N/A 01/25/2016   Procedure: Excision of suburethral mass;  Surgeon: Irine Seal, MD;  Location: [facility];  Service: Urology;  Laterality: N/A;  . BLADDER SURGERY  2009   due to cancer  . BREAST BIOPSY Left 2005  . BREAST LUMPECTOMY Left 2005  . BREAST SURGERY  2005   Left Breast Lumpectomy  . CHOLECYSTECTOMY N/A 02/19/2013   Procedure: LAPAROSCOPIC CHOLECYSTECTOMY WITH INTRAOPERATIVE CHOLANGIOGRAM;  Surgeon: Ralene Ok, MD;  Location: [facility];  Service: General;  Laterality: N/A;  . IR GENERIC HISTORICAL  05/03/2016   IR FLUORO GUIDE CV LINE RIGHT 05/03/2016 Corrie Mckusick, DO [facility]-INTERV RAD  . IR GENERIC HISTORICAL  05/03/2016   IR US GUIDE VASC ACCESS RIGHT 05/03/2016 Corrie Mckusick, DO [facility]  . IR GENERIC HISTORICAL  05/31/2016   IR FLUORO GUIDE CV LINE LEFT 05/31/2016 Sandi Mariscal, MD [facility]  . IR GENERIC HISTORICAL  05/31/2016   IR US GUIDE VASC ACCESS LEFT 05/31/2016 Sandi Mariscal, MD [facility]INTER[facility]  . REPLACEMENT TOTAL KNEE Left     OB History    No data available       Home Medications    Prior to Admission medications   Medication Sig Start Date End Date Taking?  Authorizing Provider  acetaminophen (TYLENOL) 325 MG tablet Take 650 mg by mouth every 6 (six) hours as needed for pain.   Yes [provider]  alendronate (FOSAMAX) 35 MG tablet Take 35 mg by mouth every 7 (seven) days. Take with a full glass of water on an empty stomach.   Yes [provider]  amLODipine (NORVASC) 5 MG tablet  04/13/16   [provider]  aspirin 81 MG tablet Take 81 mg by mouth daily.    [provider]  Cholecalciferol (VITAMIN D3) 5000 units TABS Take by mouth.    [provider]  diphenhydrAMINE (BENADRYL) 25 MG tablet Take 50 mg by mouth at bedtime.     [provider]  escitalopram (LEXAPRO) 10 MG tablet Take 10 mg by mouth daily.    [provider]  fluticasone (FLONASE) 50 MCG/ACT nasal spray Place 1 spray into the nose daily as needed for allergies.  06/22/12   [provider]  loperamide (IMODIUM) 2 MG capsule Take 2 mg by mouth as needed for diarrhea or loose stools.    [provider]  omeprazole (PRILOSEC) 20 MG capsule Take 20 mg by mouth daily.  06/25/12   [provider]  prochlorperazine (COMPAZINE) 10 MG tablet Take 1 tablet (10 mg total) by mouth every 6 (six) hours as needed for nausea or vomiting. Patient not taking: Reported on 07/07/2016 05/03/16   Ladell Pier, MD  traMADol (ULTRAM) 50 MG tablet Take 50 mg by mouth every 6 (six) hours as needed. 02/02/17   [provider]  VITAMIN B1-B12 IJ Inject as directed every 30 (thirty) days.    [provider]    Family History Family History  Problem Relation Age of Onset  . Stroke Mother   . Stroke Father   . Asthma Sister     Social History Social History  Substance Use Topics  . Smoking status: Never Smoker  . Smokeless tobacco: Never Used  . Alcohol use No     Allergies   Patient has no known allergies.   Review of Systems Review of Systems  Constitutional: Negative for chills and fever.  Respiratory: Negative for cough, chest tightness and shortness of breath.   Cardiovascular: Negative for chest pain, palpitations and leg swelling.  Gastrointestinal: Negative for abdominal pain, diarrhea, nausea and vomiting.  Genitourinary: Negative for dysuria, flank pain and pelvic pain.  Musculoskeletal: Positive for neck pain. Negative for arthralgias, myalgias and neck stiffness.  Skin: Negative for rash.  Neurological: Positive for headaches. Negative for dizziness and weakness.  All other systems reviewed and are negative.    Physical Exam Updated Vital Signs BP (!) 161/70 (BP Location: Left Arm)    Pulse 69   Temp 98.4 F (36.9 C) (Oral)   Resp 18   Ht 5\' 2"  (1.575 m)   Wt 73.9 kg (163 lb)   SpO2 100%   BMI 29.81 kg/m   Physical Exam  Constitutional: She is oriented to person, place, and time. She appears well-developed and well-nourished. No distress.  HENT:  Head: Normocephalic and atraumatic.  Eyes: Conjunctivae are normal.  Neck: Normal range of motion. Neck supple.  ttp over right paracervical muscles, mild midline tenderness.   Cardiovascular: Normal rate, regular rhythm and normal heart sounds.   Pulmonary/Chest: Effort normal and breath sounds normal. No respiratory distress. She has no wheezes. She has no rales.  Abdominal: Soft. Bowel sounds are normal. She exhibits no distension. There is no tenderness.  There is no rebound.  Musculoskeletal: She exhibits no edema.  Neurological: She is alert and oriented to person, place, and time. No cranial nerve deficit. Coordination normal.  5/5 and equal upper and lower extremity strength bilaterally. Equal grip strength bilaterally. Normal finger to nose and heel to shin. No pronator drift.   Skin: Skin is warm and dry.  Psychiatric: She has a normal mood and affect. Her behavior is normal.  Nursing note and vitals reviewed.    ED Treatments / Results  Labs (all labs ordered are listed, but only abnormal results are displayed) Labs Reviewed - No data to display  EKG  EKG Interpretation None       Radiology No results found.  Procedures Procedures (including critical care time)  Medications Ordered in ED Medications - No data to display   Initial Impression / Assessment and Plan / ED Course  I have reviewed the triage vital signs and the nursing notes.  Pertinent labs & imaging results that were available during my care of the patient were reviewed by me and considered in my medical decision making (see chart for details).     Pt in ED wth head trauma 1 week ago. Continues to have headache. Will get CT  head and cervical spine.    CTs negative. Pt reassured. Pt in NAD. VS normal other than htn. Advised to recheck with PCP. Stable for dc home with close outpatient follow up.   Vitals:   02/08/17 1831 02/08/17 1833 02/08/17 2135 02/08/17 2323  BP: 134/71  (!) 161/70 (!) 162/62  Pulse: 73  69 77  Resp: 16  18 18   Temp: 98 F (36.7 C)  98.4 F (36.9 C) 98.3 F (36.8 C)  TempSrc: Oral  Oral Oral  SpO2: 100%  100% 99%  Weight:  73.9 kg (163 lb)    Height:  5\' 2"  (1.575 m)       Final Clinical Impressions(s) / ED Diagnoses   Final diagnoses:  Injury of head, initial encounter  Strain of neck muscle, initial encounter    New Prescriptions Discharge Medication List as of 02/08/2017 11:35 PM       Jeannett Senior, PA-C 02/09/17 0032    Carmin Muskrat, MD 02/13/17 [phone]

## 2017-02-08 NOTE — Discharge Instructions (Signed)
Your CT head is negative. Tylenol for pain as needed. Follow up with family doctor.

## 2017-02-08 NOTE — ED Triage Notes (Signed)
Patient states she fell while trying to learn how to use her cane. Patient states she fell and hit her head. Patient denies headache, vision changes, or vomiting. Patient states [place] is going out [place] days and wanted to be checked out.

## 2017-02-24 ENCOUNTER — Other Ambulatory Visit: Payer: Self-pay | Admitting: Urology

## 2017-02-24 DIAGNOSIS — M79605 Pain in left leg: Principal | ICD-10-CM

## 2017-02-24 DIAGNOSIS — M79604 Pain in right leg: Secondary | ICD-10-CM

## 2017-02-27 ENCOUNTER — Ambulatory Visit (HOSPITAL_COMMUNITY)
Admission: RE | Admit: 2017-02-27 | Discharge: 2017-02-27 | Disposition: A | Discharge status: Home / Self Care | Payer: Medicare Other | Source: Ambulatory Visit | Attending: Urology | Admitting: Urology

## 2017-02-27 DIAGNOSIS — M79604 Pain in right leg: Secondary | ICD-10-CM

## 2017-02-27 DIAGNOSIS — C679 Malignant neoplasm of bladder, unspecified: Secondary | ICD-10-CM | POA: Diagnosis present

## 2017-02-27 DIAGNOSIS — M79605 Pain in left leg: Secondary | ICD-10-CM | POA: Diagnosis not present

## 2017-02-27 NOTE — Progress Notes (Signed)
VASCULAR LAB PRELIMINARY  PRELIMINARY  PRELIMINARY  PRELIMINARY  Bilateral lower extremity venous duplex completed.    Preliminary report:  There is no DVT or SVT noted in the bilateral lower extremities.  Interstitial edema noted in the bilateral extremities.   Breyah Akhter, RVT 02/27/2017, 3:24 PM

## 2017-03-20 ENCOUNTER — Telehealth: Payer: Self-pay | Admitting: Oncology

## 2017-03-20 ENCOUNTER — Ambulatory Visit: Admission: RE | Admit: 2017-03-20 | Payer: Medicare Other | Source: Ambulatory Visit | Admitting: Radiation Oncology

## 2017-03-20 NOTE — Telephone Encounter (Signed)
Called patient about her follow up appointment today.  She said she forgot about the appointment and would like to reschedule.  Transferred her to [facility], Art therapist for rescheduling.

## 2017-03-27 ENCOUNTER — Encounter: Payer: Self-pay | Admitting: Radiation Oncology

## 2017-03-27 ENCOUNTER — Ambulatory Visit
Admission: RE | Admit: 2017-03-27 | Discharge: 2017-03-27 | Disposition: A | Discharge status: Home / Self Care | Payer: Medicare Other | Source: Ambulatory Visit | Attending: Radiation Oncology | Admitting: Radiation Oncology

## 2017-03-27 DIAGNOSIS — C679 Malignant neoplasm of bladder, unspecified: Secondary | ICD-10-CM

## 2017-03-27 NOTE — Progress Notes (Signed)
Caitlin [name] is here for follow up.  She denies having any pain.  She also denies having any urinary/bowel issues.  She does self-cath her bladder 3-4 hours during the day.  She reports having occasional diarrhea which she attributes to having her gallbladder removed.  She reports having a good energy level.  BP (!) 146/70 (BP Location: Right Arm, Patient Position: Sitting)   Pulse 75   Temp 98.7 F (37.1 C) (Oral)   Ht 5\' 2"  (1.575 m)   Wt 162 lb 12.8 oz (73.8 kg)   SpO2 100%   BMI 29.78 kg/m

## 2017-03-27 NOTE — Progress Notes (Signed)
Radiation Oncology         [phone] ________________________________  Name: Caitlin [name] MRN: [id number]  Date: 03/27/2017  DOB: 07/12/1940  Follow-Up Visit Note  CC: Prince Solian, MD  Ladell Pier, MD    ICD-10-CM   1. 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   Of note since the patient last visit, she has had a CT pelvis completed on 01/25/2017 with results revealing no evidence of recurrence of disease or definite metastatic disease in the abdomen. On 02/08/2017, a CTs of the head and cervical spine were done revealing no acute intracranial abnormalities.   She reports that she is doing well overall. She states she is still followed by Dr. Diona Fanti with urology. She has also had a vascular study of bilateral lower extremities revealing no DVT or SVT approximately 3.5 weeks ago.  On review of systems, she reports intermittent diarrhea but states it is likely from the cholecystectomy she has had. She denies pelvic pain, back pain, decreased appetite, fatigue, hematuria or any other urinary complaints.   ALLERGIES:  has No Known Allergies.  Meds: Current Outpatient Prescriptions  Medication Sig Dispense Refill  . acetaminophen (TYLENOL) 325 MG tablet Take 650 mg by mouth every 6 (six) hours as needed for pain.    [name] alendronate (FOSAMAX) 35 MG tablet Take 35 mg by mouth every 7 (seven) days. Take with a full glass of water on an empty stomach.    [name] amLODipine (NORVASC) 5 MG tablet Take 5 mg by mouth daily.     [name] aspirin 81 MG tablet Take 81 mg by mouth daily.    . Cholecalciferol (VITAMIN D3) 5000 units TABS Take 1 tablet  by mouth daily.     [name] escitalopram (LEXAPRO) 10 MG tablet Take 10 mg by mouth daily.    . fluticasone (FLONASE) 50 MCG/ACT nasal spray Place 1 spray into the nose daily as needed for allergies.     [name] omeprazole (PRILOSEC) 20 MG capsule Take 20 mg by mouth daily.     [name] telmisartan (MICARDIS) 20 MG tablet     . traMADol (ULTRAM) 50 MG tablet Take 50 mg by mouth every 6 (six) hours as needed for moderate pain or severe pain.     [name] VITAMIN B1-B12 IJ Inject as directed every 30 (thirty) days.    . diphenhydrAMINE (BENADRYL) 25 MG tablet Take 50 mg by mouth at bedtime as needed for allergies or sleep.     [name] loperamide (IMODIUM) 2 MG capsule Take 2 mg by mouth as needed for diarrhea or loose stools.    . prochlorperazine (COMPAZINE) 10 MG tablet Take 1 tablet (10 mg total) by mouth every 6 (six) hours as needed for nausea or vomiting. (Patient not taking: Reported on 07/07/2016) 30 tablet 0   No current facility-administered medications for this encounter.     Physical Findings: The patient is in no acute distress. Patient is alert and oriented.  height is 5\' 2"  (1.575 m) and weight is 162 lb 12.8 oz (73.8 kg). Her oral temperature is 98.7 F (37.1 C). Her blood pressure is 146/70 (abnormal) and her pulse is 75. Her oxygen saturation is  100%.  No significant changes.  Lungs are clear to auscultation bilaterally. Heart has regular rate and rhythm. No palpable cervical, supraclavicular, or axillary adenopathy. Abdomen soft, non-tender, normal bowel sounds. On pelvic examination the external genitalia were unremarkable. A speculum exam was performed. There are no mucosal lesions noted in the vaginal vault.  On bimanual and rectovaginal examination there were no pelvic masses appreciated.  No inguinal adenopathy appreciated on exam   Lab Findings: Lab Results  Component Value Date   WBC 5.2 01/31/2017   HGB 11.4 (L) 01/31/2017   HCT 36.0 01/31/2017   MCV 98.4 01/31/2017   PLT 341 01/31/2017     Radiographic Findings: No results found.  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   Plan:  Will follow up with Dr. Benay Spice in January 2019 and with radiation oncology in April 2019.   -----------------------------------  Blair Promise, PhD, MD  This document serves as a record of services personally performed by Gery Pray, MD. It was created on his behalf by Marlowe Kays, a trained medical scribe. The creation of this record is based on the scribe's personal observations and the provider's statements to them. This document has been checked and approved by the attending provider.

## 2017-04-19 ENCOUNTER — Telehealth: Payer: Self-pay | Admitting: *Deleted

## 2017-04-19 NOTE — Telephone Encounter (Signed)
CALLED PATIENT TO INFORM OF FU WITH DR. KINARD ON 09-25-17 @ 3:30 PM, LVM FOR A RETURN CALL

## 2017-06-06 ENCOUNTER — Inpatient Hospital Stay: Payer: Medicare Other | Attending: Oncology | Admitting: Oncology

## 2017-06-06 ENCOUNTER — Telehealth: Payer: Self-pay | Admitting: Oncology

## 2017-06-06 VITALS — BP 173/72 | HR 78 | Temp 98.0°F | Resp 20 | Ht 62.0 in | Wt 167.1 lb

## 2017-06-06 DIAGNOSIS — Z923 Personal history of irradiation: Secondary | ICD-10-CM | POA: Diagnosis not present

## 2017-06-06 DIAGNOSIS — I10 Essential (primary) hypertension: Secondary | ICD-10-CM

## 2017-06-06 DIAGNOSIS — Z853 Personal history of malignant neoplasm of breast: Secondary | ICD-10-CM | POA: Insufficient documentation

## 2017-06-06 DIAGNOSIS — Z8551 Personal history of malignant neoplasm of bladder: Secondary | ICD-10-CM | POA: Insufficient documentation

## 2017-06-06 DIAGNOSIS — C679 Malignant neoplasm of bladder, unspecified: Secondary | ICD-10-CM

## 2017-06-06 DIAGNOSIS — Z9221 Personal history of antineoplastic chemotherapy: Secondary | ICD-10-CM | POA: Diagnosis not present

## 2017-06-06 NOTE — Progress Notes (Signed)
Caitlin [name]   Diagnosis: Urothelial carcinoma  INTERVAL HISTORY:   Caitlin [name] returns as scheduled.  She feels well.  No difficulty with catheterization.  No palpable inguinal node.  She has intermittent diarrhea since undergoing gallbladder surgery.  She uses Imodium as needed.  She saw Dr. Dagmar Hait for evaluation of tingling in the feet.  Objective:  Vital signs in last 24 hours:  Blood pressure (!) 173/72, pulse 78, temperature 98 F (36.7 C), temperature source Oral, resp. rate 20, height 5\' 2"  (1.575 m), weight 167 lb 1.6 oz (75.8 kg), SpO2 99 %.    HEENT: Neck without mass Lymphatics: No cervical, supraclavicular, axillary, inguinal, or femoral nodes Resp: Lungs clear bilaterally Cardio: Regular rate and rhythm GI: No hepatosplenomegaly, no mass Vascular: No leg edema, support stockings in place    Lab Results:  Lab Results  Component Value Date   WBC 5.2 01/31/2017   HGB 11.4 (L) 01/31/2017   HCT 36.0 01/31/2017   MCV 98.4 01/31/2017   PLT 341 01/31/2017   NEUTROABS 3.8 01/31/2017    CMP     Component Value Date/Time   NA 139 01/31/2017 0826   K 4.0 01/31/2017 0826   CL 109 10/05/2016 1105   CO2 23 01/31/2017 0826   GLUCOSE 127 01/31/2017 0826   BUN 19.3 01/31/2017 0826   CREATININE 1.4 (H) 01/31/2017 0826   CALCIUM 9.5 01/31/2017 0826   PROT 7.1 06/09/2016 0941   ALBUMIN 3.2 (L) 06/09/2016 0941   AST 14 06/09/2016 0941   ALT 8 06/09/2016 0941   ALKPHOS 56 06/09/2016 0941   BILITOT 0.44 06/09/2016 0941   GFRNONAA 48 (L) 10/05/2016 1105   GFRAA 55 (L) 10/05/2016 1105     Medications: I have reviewed the patient's current medications.   Assessment/Plan: 1.Bladder carcinoma, sarcomatoid urothelial carcinoma-status post trans-urethral resection of a left bladder wall tumor in September 2009 ? Cystectomy, bilateral pelvic lymphadenectomy, and ileal neobladder 04/21/2008-no residual malignancy identified ? Presentation  with bleeding, urethral mass August 2017, status post resection of a subcutaneous urethral mass 01/25/2016 revealing high-grade urothelial carcinoma with squamous cell features ? Chest x-ray 01/29/2016-negative for metastatic disease ? CT abdomen/pelvis 02/02/2016-enlarged right inguinal lymph node with a borderline adjacent right common femoral node ? Biopsyof right inguinal lymph node on 02/17/2016 confirming metastatic carcinoma, pathology review at [facility] found the inguinal lymph node biopsy to be consistent with metastatic carcinoma similar to the urethral mass. ? PET scan at [facility] on 03/31/2016 revealed right external iliac and inguinal hypermetabolic lymph nodes and hypermetabolic activity associated with a sclerotic lesion at C3. MRI 04/12/2016 consistent with degenerative changes at C3-C4 ? Initiation of radiation 05/02/2016; completion of radiation 06/08/2015 ? Cycle 1 5-FU/mitomycin C 05/03/2016 ? Cycle 2 5-FU/mitomycin-C 06/03/2015 ? Radiation completed 06/07/2016 ? CT pelvis February 21, 202018-persistent enlarged right inguinal node ? Ultrasound-guided biopsy of the right inguinal node 10/05/2016 revealed metastatic carcinoma with necrosis ? CT pelvis 11/21/2017-decrease in the size of the right inguinal lymph node ? CT pelvis 01/25/2017-no evidence of metastatic carcinoma  2.Breast cancer, left T1b, N0-2005, status post lumpectomy/radiation and Aromasin  3. Hypertension  4. Skin toxicity related to radiation-resolved   Disposition: [name] remains in clinical remission from urothelial carcinoma.  She will see Dr. Sondra Come in approximately 2 months.  She will return for office visit here in 6 months.  15 minutes were spent with the patient today.  The majority of the time was used for counseling and coordination of care.  Betsy Coder, MD  06/06/2017  8:49 AM

## 2017-06-06 NOTE — Telephone Encounter (Signed)
Gave avs and calendar for july °

## 2017-06-21 ENCOUNTER — Encounter (HOSPITAL_COMMUNITY)
Admission: EM | Disposition: A | Discharge status: Home Health | Payer: Self-pay | Source: Home / Self Care | Attending: Orthopedic Surgery

## 2017-06-21 ENCOUNTER — Emergency Department (HOSPITAL_COMMUNITY): Payer: Medicare Other

## 2017-06-21 ENCOUNTER — Emergency Department (HOSPITAL_COMMUNITY): Payer: Medicare Other | Admitting: Certified Registered Nurse Anesthetist

## 2017-06-21 ENCOUNTER — Inpatient Hospital Stay (HOSPITAL_COMMUNITY)
Admission: EM | Admit: 2017-06-21 | Discharge: 2017-06-23 | DRG: 481 | Disposition: A | Discharge status: Home Health | Payer: Medicare Other | Attending: Orthopedic Surgery | Admitting: Orthopedic Surgery

## 2017-06-21 ENCOUNTER — Encounter (HOSPITAL_COMMUNITY): Payer: Self-pay

## 2017-06-21 DIAGNOSIS — Z853 Personal history of malignant neoplasm of breast: Secondary | ICD-10-CM

## 2017-06-21 DIAGNOSIS — W1839XA Other fall on same level, initial encounter: Secondary | ICD-10-CM | POA: Diagnosis present

## 2017-06-21 DIAGNOSIS — D62 Acute posthemorrhagic anemia: Secondary | ICD-10-CM | POA: Diagnosis not present

## 2017-06-21 DIAGNOSIS — Z923 Personal history of irradiation: Secondary | ICD-10-CM | POA: Diagnosis not present

## 2017-06-21 DIAGNOSIS — M25552 Pain in left hip: Secondary | ICD-10-CM | POA: Diagnosis present

## 2017-06-21 DIAGNOSIS — Z79899 Other long term (current) drug therapy: Secondary | ICD-10-CM | POA: Diagnosis not present

## 2017-06-21 DIAGNOSIS — I129 Hypertensive chronic kidney disease with stage 1 through stage 4 chronic kidney disease, or unspecified chronic kidney disease: Secondary | ICD-10-CM | POA: Diagnosis present

## 2017-06-21 DIAGNOSIS — N189 Chronic kidney disease, unspecified: Secondary | ICD-10-CM | POA: Diagnosis present

## 2017-06-21 DIAGNOSIS — Z7983 Long term (current) use of bisphosphonates: Secondary | ICD-10-CM | POA: Diagnosis not present

## 2017-06-21 DIAGNOSIS — S7290XA Unspecified fracture of unspecified femur, initial encounter for closed fracture: Secondary | ICD-10-CM | POA: Insufficient documentation

## 2017-06-21 DIAGNOSIS — Z8551 Personal history of malignant neoplasm of bladder: Secondary | ICD-10-CM

## 2017-06-21 DIAGNOSIS — Y9222 Religious institution as the place of occurrence of the external cause: Secondary | ICD-10-CM | POA: Diagnosis not present

## 2017-06-21 DIAGNOSIS — Z419 Encounter for procedure for purposes other than remedying health state, unspecified: Secondary | ICD-10-CM

## 2017-06-21 DIAGNOSIS — S7222XA Displaced subtrochanteric fracture of left femur, initial encounter for closed fracture: Secondary | ICD-10-CM | POA: Diagnosis present

## 2017-06-21 DIAGNOSIS — K219 Gastro-esophageal reflux disease without esophagitis: Secondary | ICD-10-CM | POA: Diagnosis present

## 2017-06-21 DIAGNOSIS — Z96652 Presence of left artificial knee joint: Secondary | ICD-10-CM | POA: Diagnosis present

## 2017-06-21 HISTORY — PX: FEMUR IM NAIL: SHX1597

## 2017-06-21 LAB — BASIC METABOLIC PANEL
ANION GAP: 12 (ref 5–15)
BUN: 19 mg/dL (ref 6–20)
CO2: 21 mmol/L — ABNORMAL LOW (ref 22–32)
Calcium: 8.9 mg/dL (ref 8.9–10.3)
Chloride: 106 mmol/L (ref 101–111)
Creatinine, Ser: 1.53 mg/dL — ABNORMAL HIGH (ref 0.44–1.00)
GFR calc Af Amer: 37 mL/min — ABNORMAL LOW (ref >60)
GFR, EST NON AFRICAN AMERICAN: 32 mL/min — AB (ref >60)
GLUCOSE: 126 mg/dL — AB (ref 65–99)
Potassium: 3.8 mmol/L (ref 3.5–5.1)
SODIUM: 139 mmol/L (ref 135–145)

## 2017-06-21 LAB — CBC WITH DIFFERENTIAL/PLATELET
BASOS ABS: 0 10*3/uL (ref 0.0–0.1)
BASOS PCT: 0 %
Eosinophils Absolute: 0.1 10*3/uL (ref 0.0–0.7)
Eosinophils Relative: 1 %
HEMATOCRIT: 36.2 % (ref 36.0–46.0)
Hemoglobin: 11.4 g/dL — ABNORMAL LOW (ref 12.0–15.0)
Lymphocytes Relative: 10 %
Lymphs Abs: 0.9 10*3/uL (ref 0.7–4.0)
MCH: 30.5 pg (ref 26.0–34.0)
MCHC: 31.5 g/dL (ref 30.0–36.0)
MCV: 96.8 fL (ref 78.0–100.0)
MONO ABS: 0.3 10*3/uL (ref 0.1–1.0)
Monocytes Relative: 3 %
NEUTROS ABS: 7.8 10*3/uL — AB (ref 1.7–7.7)
Neutrophils Relative %: 86 %
PLATELETS: 401 10*3/uL — AB (ref 150–400)
RBC: 3.74 MIL/uL — ABNORMAL LOW (ref 3.87–5.11)
RDW: 14.4 % (ref 11.5–15.5)
WBC: 9.1 10*3/uL (ref 4.0–10.5)

## 2017-06-21 LAB — I-STAT CHEM 8, ED
BUN: 20 mg/dL (ref 6–20)
CHLORIDE: 105 mmol/L (ref 101–111)
Calcium, Ion: 1.12 mmol/L — ABNORMAL LOW (ref 1.15–1.40)
Creatinine, Ser: 1.3 mg/dL — ABNORMAL HIGH (ref 0.44–1.00)
Glucose, Bld: 123 mg/dL — ABNORMAL HIGH (ref 65–99)
HCT: 36 % (ref 36.0–46.0)
Hemoglobin: 12.2 g/dL (ref 12.0–15.0)
Potassium: 3.8 mmol/L (ref 3.5–5.1)
SODIUM: 141 mmol/L (ref 135–145)
TCO2: 23 mmol/L (ref 22–32)

## 2017-06-21 LAB — PROTIME-INR
INR: 0.96
Prothrombin Time: 12.7 seconds (ref 11.4–15.2)

## 2017-06-21 SURGERY — INSERTION, INTRAMEDULLARY ROD, FEMUR
Anesthesia: General | Site: Hip | Laterality: Left

## 2017-06-21 MED ORDER — LIDOCAINE HCL (CARDIAC) 20 MG/ML IV SOLN
INTRAVENOUS | Status: DC | PRN
  Administered 2017-06-21: 100 mg via INTRAVENOUS

## 2017-06-21 MED ORDER — SODIUM CHLORIDE 0.9 % IJ SOLN
INTRAMUSCULAR | Status: AC
  Filled 2017-06-21: qty 10

## 2017-06-21 MED ORDER — SUFENTANIL CITRATE 50 MCG/ML IV SOLN
INTRAVENOUS | Status: AC
  Filled 2017-06-21: qty 1

## 2017-06-21 MED ORDER — ROCURONIUM BROMIDE 100 MG/10ML IV SOLN
INTRAVENOUS | Status: DC | PRN
  Administered 2017-06-21: 50 mg via INTRAVENOUS

## 2017-06-21 MED ORDER — 0.9 % SODIUM CHLORIDE (POUR BTL) OPTIME
TOPICAL | Status: DC | PRN
  Administered 2017-06-21: 1000 mL

## 2017-06-21 MED ORDER — PROPOFOL 10 MG/ML IV BOLUS
INTRAVENOUS | Status: AC
  Filled 2017-06-21: qty 20

## 2017-06-21 MED ORDER — CEFAZOLIN SODIUM-DEXTROSE 2-3 GM-%(50ML) IV SOLR
INTRAVENOUS | Status: DC | PRN
  Administered 2017-06-21: 2 g via INTRAVENOUS

## 2017-06-21 MED ORDER — FENTANYL CITRATE (PF) 100 MCG/2ML IJ SOLN
50.0000 ug | Freq: Once | INTRAMUSCULAR | Status: AC
  Administered 2017-06-21: 50 ug via INTRAVENOUS
  Filled 2017-06-21: qty 2

## 2017-06-21 MED ORDER — ROCURONIUM BROMIDE 10 MG/ML (PF) SYRINGE
PREFILLED_SYRINGE | INTRAVENOUS | Status: AC
  Filled 2017-06-21: qty 5

## 2017-06-21 MED ORDER — LIDOCAINE 2% (20 MG/ML) 5 ML SYRINGE
INTRAMUSCULAR | Status: AC
  Filled 2017-06-21: qty 5

## 2017-06-21 MED ORDER — ONDANSETRON HCL 4 MG/2ML IJ SOLN
INTRAMUSCULAR | Status: AC
  Filled 2017-06-21: qty 2

## 2017-06-21 MED ORDER — PROPOFOL 1000 MG/100ML IV EMUL
INTRAVENOUS | Status: AC
  Filled 2017-06-21: qty 100

## 2017-06-21 MED ORDER — DEXAMETHASONE SODIUM PHOSPHATE 10 MG/ML IJ SOLN
INTRAMUSCULAR | Status: DC | PRN
  Administered 2017-06-21: 10 mg via INTRAVENOUS

## 2017-06-21 MED ORDER — PROPOFOL 10 MG/ML IV BOLUS
INTRAVENOUS | Status: DC | PRN
  Administered 2017-06-21: 80 mg via INTRAVENOUS
  Administered 2017-06-22: 50 mg via INTRAVENOUS

## 2017-06-21 MED ORDER — EPHEDRINE 5 MG/ML INJ
INTRAVENOUS | Status: AC
  Filled 2017-06-21: qty 10

## 2017-06-21 MED ORDER — ALBUMIN HUMAN 5 % IV SOLN
INTRAVENOUS | Status: DC | PRN
  Administered 2017-06-21: 23:00:00 via INTRAVENOUS

## 2017-06-21 MED ORDER — SUGAMMADEX SODIUM 200 MG/2ML IV SOLN
INTRAVENOUS | Status: AC
  Filled 2017-06-21: qty 2

## 2017-06-21 MED ORDER — LACTATED RINGERS IV SOLN
INTRAVENOUS | Status: DC | PRN
  Administered 2017-06-21 (×2): via INTRAVENOUS

## 2017-06-21 MED ORDER — SUFENTANIL CITRATE 50 MCG/ML IV SOLN
INTRAVENOUS | Status: DC | PRN
  Administered 2017-06-21 (×3): 10 ug via INTRAVENOUS
  Administered 2017-06-21: 20 ug via INTRAVENOUS

## 2017-06-21 MED ORDER — FENTANYL CITRATE (PF) 100 MCG/2ML IJ SOLN: INTRAMUSCULAR | Status: DC | PRN

## 2017-06-21 MED ORDER — EPHEDRINE SULFATE 50 MG/ML IJ SOLN
INTRAMUSCULAR | Status: DC | PRN
  Administered 2017-06-21: 10 mg via INTRAVENOUS

## 2017-06-21 MED ORDER — CEFAZOLIN SODIUM-DEXTROSE 2-4 GM/100ML-% IV SOLN
INTRAVENOUS | Status: AC
  Filled 2017-06-21: qty 100

## 2017-06-21 MED ORDER — DEXAMETHASONE SODIUM PHOSPHATE 10 MG/ML IJ SOLN
INTRAMUSCULAR | Status: AC
  Filled 2017-06-21: qty 1

## 2017-06-21 SURGICAL SUPPLY — 47 items
BIT DRILL 4.3MMS DISTAL GRDTED (BIT) ×1 IMPLANT
BLADE CLIPPER SURG (BLADE) ×2 IMPLANT
BNDG GAUZE ELAST 4 BULKY (GAUZE/BANDAGES/DRESSINGS) ×2 IMPLANT
COVER MAYO STAND STRL (DRAPES) ×2 IMPLANT
COVER PERINEAL POST (MISCELLANEOUS) ×2 IMPLANT
COVER SURGICAL LIGHT HANDLE (MISCELLANEOUS) ×2 IMPLANT
DECANTER SPIKE VIAL GLASS SM (MISCELLANEOUS) IMPLANT
DRAPE STERI IOBAN 125X83 (DRAPES) IMPLANT
DRILL 4.3MMS DISTAL GRADUATED (BIT) ×2
DRSG MEPILEX BORDER 4X4 (GAUZE/BANDAGES/DRESSINGS) ×2 IMPLANT
DRSG MEPILEX BORDER 4X8 (GAUZE/BANDAGES/DRESSINGS) ×2 IMPLANT
DURAPREP 26ML APPLICATOR (WOUND CARE) ×2 IMPLANT
ELECT CAUTERY BLADE 6.4 (BLADE) ×2 IMPLANT
ELECT REM PT RETURN 9FT ADLT (ELECTROSURGICAL) ×2
ELECTRODE REM PT RTRN 9FT ADLT (ELECTROSURGICAL) ×1 IMPLANT
EVACUATOR 1/8 PVC DRAIN (DRAIN) IMPLANT
GAUZE XEROFORM 5X9 LF (GAUZE/BANDAGES/DRESSINGS) IMPLANT
GLOVE BIOGEL PI IND STRL 8 (GLOVE) ×2 IMPLANT
GLOVE BIOGEL PI INDICATOR 8 (GLOVE) ×2
GLOVE ECLIPSE 7.5 STRL STRAW (GLOVE) ×4 IMPLANT
GOWN STRL REUS W/ TWL LRG LVL3 (GOWN DISPOSABLE) ×1 IMPLANT
GOWN STRL REUS W/ TWL XL LVL3 (GOWN DISPOSABLE) ×2 IMPLANT
GOWN STRL REUS W/TWL LRG LVL3 (GOWN DISPOSABLE) ×1
GOWN STRL REUS W/TWL XL LVL3 (GOWN DISPOSABLE) ×2
GUIDEPIN 3.2X17.5 THRD DISP (PIN) ×2 IMPLANT
GUIDEWIRE BALL NOSE 100CM (WIRE) ×2 IMPLANT
HFN LAG SCREW 10.5MM X 85MM (Screw) ×2 IMPLANT
HFN LH 130 DEG 9MM X 320MM (Nail) ×2 IMPLANT
KIT BASIN OR (CUSTOM PROCEDURE TRAY) ×2 IMPLANT
KIT ROOM TURNOVER OR (KITS) ×2 IMPLANT
LINER BOOT UNIVERSAL DISP (MISCELLANEOUS) ×2 IMPLANT
MANIFOLD NEPTUNE II (INSTRUMENTS) ×2 IMPLANT
NAIL FIXATION IM (Nail) ×2 IMPLANT
NEEDLE HYPO 25GX1X1/2 BEV (NEEDLE) ×2 IMPLANT
NS IRRIG 1000ML POUR BTL (IV SOLUTION) ×2 IMPLANT
PACK GENERAL/GYN (CUSTOM PROCEDURE TRAY) ×2 IMPLANT
PAD ARMBOARD 7.5X6 YLW CONV (MISCELLANEOUS) ×4 IMPLANT
SCREW BONE CORTICAL 5.0X3 (Screw) ×2 IMPLANT
SCREW BONE CORTICAL 5.0X36 (Screw) ×2 IMPLANT
STAPLER VISISTAT 35W (STAPLE) ×2 IMPLANT
SUT VIC AB 0 CTB1 27 (SUTURE) ×4 IMPLANT
SUT VIC AB 1 CTB1 27 (SUTURE) ×2 IMPLANT
SUT VIC AB 2-0 CTB1 (SUTURE) ×2 IMPLANT
SYR CONTROL 10ML LL (SYRINGE) ×2 IMPLANT
TOWEL OR 17X24 6PK STRL BLUE (TOWEL DISPOSABLE) ×2 IMPLANT
TOWEL OR 17X26 10 PK STRL BLUE (TOWEL DISPOSABLE) ×2 IMPLANT
WATER STERILE IRR 1000ML POUR (IV SOLUTION) ×2 IMPLANT

## 2017-06-21 NOTE — H&P (Addendum)
PREOPERATIVE H&P  Chief Complaint: Left leg pain  HPI: Caitlin [name] is a 77 y.o. female who presents for evaluation of left leg pain. It has been present for several hours since a fall and has been worsening. She has failed conservative measures. Pain is rated as severe.  Past Medical History:  Diagnosis Date  . Arthritis   . 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 Bladder cancer ([place])   . Breast cancer ([place])   . Chronic kidney disease    does not empty bladder,self cath  . Complication of anesthesia   . GERD (gastroesophageal reflux disease)   . History of radiation therapy 05/02/16-06/07/16   pelvis 55 Gy in 25 fractions  . Hypertension   . Mitral valve prolapse   . Personal history of radiation therapy 2005  . PONV (postoperative nausea and vomiting)   . Self-catheterizes urinary bladder    Past Surgical History:  Procedure Laterality Date  . ABDOMINAL HYSTERECTOMY  1980   total  . ANTERIOR AND POSTERIOR REPAIR N/A 01/25/2016   Procedure: Excision of suburethral mass;  Surgeon: Irine Seal, MD;  Location: [facility];  Service: Urology;  Laterality: N/A;  . BLADDER SURGERY  2009   due to cancer  . BREAST BIOPSY Left 2005  . BREAST LUMPECTOMY Left 2005  . BREAST SURGERY  2005   Left Breast Lumpectomy  . CHOLECYSTECTOMY N/A 02/19/2013   Procedure: LAPAROSCOPIC CHOLECYSTECTOMY WITH INTRAOPERATIVE CHOLANGIOGRAM;  Surgeon: Ralene Ok, MD;  Location: [facility];  Service: General;  Laterality: N/A;  . IR GENERIC HISTORICAL  05/03/2016   IR FLUORO GUIDE CV LINE RIGHT 05/03/2016 Corrie Mckusick, DO [facility]  . IR GENERIC HISTORICAL  05/03/2016   IR US GUIDE VASC ACCESS RIGHT 05/03/2016 Corrie Mckusick, DO [facility]  . IR GENERIC HISTORICAL  05/31/2016   IR FLUORO GUIDE CV LINE LEFT 05/31/2016 Sandi Mariscal, MD [facility]  . IR GENERIC HISTORICAL  05/31/2016   IR US GUIDE VASC ACCESS LEFT 05/31/2016 Sandi Mariscal, MD [facility]  . REPLACEMENT TOTAL KNEE Left    Social History    Socioeconomic History  . Marital status: Married    Spouse name: None  . Number of children: 1  . Years of education: None  . Highest education level: None  Social Needs  . Financial resource strain: None  . Food insecurity - worry: None  . Food insecurity - inability: None  . Transportation needs - medical: None  . Transportation needs - non-medical: None  Occupational History  . None  Tobacco Use  . Smoking status: Never Smoker  . Smokeless tobacco: Never Used  Substance and Sexual Activity  . Alcohol use: No  . Drug use: No  . Sexual activity: Not Currently  Other Topics Concern  . None  Social History Narrative  . None   Family History  Problem Relation Age of Onset  . Stroke Mother   . Stroke Father   . Asthma Sister    No Known Allergies Prior to Admission medications   Medication Sig Start Date End Date Taking? Authorizing Provider  acetaminophen (TYLENOL) 325 MG tablet Take 650 mg by mouth every 6 (six) hours as needed for pain.    [provider]  alendronate (FOSAMAX) 35 MG tablet Take 35 mg by mouth every 7 (seven) days. Take with a full glass of water on an empty stomach.    [provider]  amLODipine (NORVASC) 5 MG tablet Take 5 mg by  mouth daily.  04/13/16   [provider]  aspirin 81 MG tablet Take 81 mg by mouth daily.    [provider]  Cholecalciferol (VITAMIN D3) 5000 units TABS Take 1 tablet by mouth daily.     [provider]  diphenhydrAMINE (BENADRYL) 25 MG tablet Take 50 mg by mouth at bedtime as needed for allergies or sleep.     [provider]  escitalopram (LEXAPRO) 10 MG tablet Take 10 mg by mouth daily.    [provider]  fluticasone (FLONASE) 50 MCG/ACT nasal spray Place 1 spray into the nose daily as needed for allergies.  06/22/12   [provider]  loperamide (IMODIUM) 2 MG capsule Take 2 mg by mouth as needed for diarrhea or loose stools.    [provider]  omeprazole (PRILOSEC) 20 MG capsule Take 20 mg by mouth daily.  06/25/12   [provider]  prochlorperazine (COMPAZINE) 10 MG tablet Take 1 tablet (10 mg total) by mouth every 6 (six) hours as needed for nausea or vomiting. Patient not taking: Reported on 07/07/2016 05/03/16   Ladell Pier, MD  telmisartan (MICARDIS) 20 MG tablet  03/08/17   [provider]  traMADol (ULTRAM) 50 MG tablet Take 50 mg by mouth every 6 (six) hours as needed for moderate pain or severe pain.  02/02/17   [provider]  VITAMIN B1-B12 IJ Inject as directed every 30 (thirty) days.    [provider]     Positive ROS: None  All other systems have been reviewed and were otherwise negative with the exception of those mentioned in the HPI and as above.  Physical Exam: Vitals:   06/21/17 2045 06/21/17 2100  BP: (!) 146/92 (!) 165/70  Pulse: 67 70  Resp: (!) 21 (!) 22  Temp:    SpO2: 100% 100%    General: Alert, no acute distress Cardiovascular: No pedal edema Respiratory: No cyanosis, no use of accessory musculature GI: No organomegaly, abdomen is soft and non-tender Skin: No lesions in the area of chief complaint Neurologic: Sensation intact distally Psychiatric: Patient is competent for consent with normal mood and affect Lymphatic: No axillary or cervical lymphadenopathy  MUSCULOSKELETAL: Left leg: Pain with all range of motion.  Pain with flexion extension.  Pain with rotation.  X-ray: X-rays of the left hip show a transverse subtrochanteric femur fracture  Recent Results (from the past 2160 hour(s))  CBC WITH DIFFERENTIAL     Status: Abnormal   Collection Time: 06/21/17  9:12 PM  Result Value Ref Range   WBC 9.1 4.0 - 10.5 K/uL   RBC 3.74 (L) 3.87 - 5.11 MIL/uL   Hemoglobin 11.4 (L) 12.0 - 15.0 g/dL   HCT 36.2 36.0 - 46.0 %   MCV 96.8 78.0 - 100.0 fL   MCH 30.5 26.0 - 34.0 pg   MCHC 31.5 30.0 - 36.0 g/dL   RDW 14.4 11.5 - 15.5 %    Platelets 401 (H) 150 - 400 K/uL   Neutrophils Relative % 86 %   Neutro Abs 7.8 (H) 1.7 - 7.7 K/uL   Lymphocytes Relative 10 %   Lymphs Abs 0.9 0.7 - 4.0 K/uL   Monocytes Relative 3 %   Monocytes Absolute 0.3 0.1 - 1.0 K/uL   Eosinophils Relative 1 %   Eosinophils Absolute 0.1 0.0 - 0.7 K/uL   Basophils Relative 0 %   Basophils Absolute 0.0 0.0 - 0.1 K/uL  Protime-INR  Status: None   Collection Time: 06/21/17  9:12 PM  Result Value Ref Range   Prothrombin Time 12.7 11.4 - 15.2 seconds   INR 0.96   I-Stat Chem 8, ED     Status: Abnormal   Collection Time: 06/21/17  9:35 PM  Result Value Ref Range   Sodium 141 135 - 145 mmol/L   Potassium 3.8 3.5 - 5.1 mmol/L   Chloride 105 101 - 111 mmol/L   BUN 20 6 - 20 mg/dL   Creatinine, Ser 1.30 (H) 0.44 - 1.00 mg/dL   Glucose, Bld 123 (H) 65 - 99 mg/dL   Calcium, Ion 1.12 (L) 1.15 - 1.40 mmol/L   TCO2 23 22 - 32 mmol/L   Hemoglobin 12.2 12.0 - 15.0 g/dL   HCT 36.0 36.0 - 46.0 %   Assessment/Plan: left femur fracture Plan for Procedure(s): INTRAMEDULLARY (IM) NAIL FEMORAL  The risks benefits and alternatives were discussed with the patient including but not limited to the risks of nonoperative treatment, versus surgical intervention including infection, bleeding, nerve injury, malunion, nonunion, hardware prominence, hardware failure, need for hardware removal, blood clots, cardiopulmonary complications, morbidity, mortality, among others, and they were willing to proceed.  Predicted outcome is good, although there will be at least a six to nine month expected recovery.  Pt is only 3 hrs NPO but is having spasms and significant pain and I believe the femur fracture requires emergency fixation.  We will plan on proceeding at this time.  Alta Corning, MD 06/21/2017 9:29 PM

## 2017-06-21 NOTE — ED Notes (Signed)
Pt's c-collar removed by Dr.Teglar and cleared;-Monique,RN

## 2017-06-21 NOTE — ED Triage Notes (Signed)
[place] [facility] [place]; pt was standing and was bumped by a child causing pt to fall; pt denies LOC but has obvious deformity to left hip; Pt has hx of bursitis in bilateral hip which caused her to use cane for walking intermittedly; Pt is A&O x 4 on arrival.-Monique,RN

## 2017-06-21 NOTE — ED Notes (Signed)
Ortho at bedside.

## 2017-06-21 NOTE — Anesthesia Preprocedure Evaluation (Addendum)
Anesthesia Evaluation  Patient identified by MRN, date of birth, ID band Patient awake    Reviewed: Allergy & Precautions, NPO status , Patient's Chart, lab work & pertinent test results  Airway Mallampati: II  TM Distance: >3 FB Neck ROM: Full    Dental  (+) Teeth Intact, Dental Advisory Given   Pulmonary    breath sounds clear to auscultation       Cardiovascular hypertension,  Rhythm:Regular Rate:Normal     Neuro/Psych    GI/Hepatic   Endo/Other    Renal/GU      Musculoskeletal   Abdominal   Peds  Hematology   Anesthesia Other Findings   Reproductive/Obstetrics                            Anesthesia Physical Anesthesia Plan  ASA: III  Anesthesia Plan: General   Post-op Pain Management:    Induction: Intravenous  PONV Risk Score and Plan: 1 and Ondansetron and Dexamethasone  Airway Management Planned: Oral ETT  Additional Equipment:   Intra-op Plan:   Post-operative Plan: Extubation in OR  Informed Consent: I have reviewed the patients History and Physical, chart, labs and discussed the procedure including the risks, benefits and alternatives for the proposed anesthesia with the patient or authorized representative who has indicated his/her understanding and acceptance.   Dental advisory given  Plan Discussed with: CRNA and Anesthesiologist  Anesthesia Plan Comments:         Anesthesia Quick Evaluation

## 2017-06-21 NOTE — ED Provider Notes (Signed)
[facility] Provider Note   CSN: [id number] Arrival date & time: 06/21/17  1939     History   Chief Complaint Chief Complaint  [name] presents with  . Fall  . Hip Injury    HPI Caitlin [name] is a 77 y.o. female.  [facility] history is provided by Caitlin [name], Caitlin [name] started 1 to 2 hours ago. Caitlin problem occurs rarely. Pertinent negatives include no chest pain, no abdominal pain, no headaches and no shortness of breath. Caitlin symptoms are aggravated by standing. Nothing relieves Caitlin symptoms. She has tried nothing for Caitlin symptoms. Caitlin treatment provided no relief.  Hip Pain  This is a new problem. Caitlin problem occurs constantly. Caitlin problem has not changed since onset.Pertinent negatives include no chest pain, no abdominal pain, no headaches and no shortness of breath. Caitlin symptoms are aggravated by standing and walking. Nothing relieves Caitlin symptoms. She has tried nothing for Caitlin symptoms.      Past Medical History:  Diagnosis Date  . Arthritis   . 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 Bladder cancer ([place])   . Breast cancer ([place])   . Chronic kidney disease    does not empty bladder,self cath  . Complication of anesthesia   . GERD (gastroesophageal reflux disease)   . History of radiation therapy 05/02/16-06/07/16   pelvis 55 Gy in 25 fractions  . Hypertension   . Mitral valve prolapse   . Personal history of radiation therapy 2005  . PONV (postoperative nausea and vomiting)   . Self-catheterizes urinary bladder     [name] Active Problem List   Diagnosis Date Noted  . 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[place]) 04/01/2016  . Hyperkalemia 01/27/2016  . Acute blood loss anemia 01/27/2016  . Acute renal insufficiency 01/26/2016  . Urethral caruncle 01/26/2016  . Urinary tract infection 01/25/2016  . PAC (premature atrial contraction)  11/29/2012  . DOE (dyspnea on exertion) 11/29/2012  . Orthostatic hypotension 10/10/2012  . Dizziness 10/10/2012  . UTI (lower urinary tract infection) 10/10/2012  . HTN (hypertension) 10/10/2012    Past Surgical History:  Procedure Laterality Date  . ABDOMINAL HYSTERECTOMY  1980   total  . ANTERIOR AND POSTERIOR REPAIR N/A 01/25/2016   Procedure: Excision of suburethral mass;  Surgeon: Irine Seal, MD;  Location: [facility];  Service: Urology;  Laterality: N/A;  . BLADDER SURGERY  2009   due to cancer  . BREAST BIOPSY Left 2005  . BREAST LUMPECTOMY Left 2005  . BREAST SURGERY  2005   Left Breast Lumpectomy  . CHOLECYSTECTOMY N/A 02/19/2013   Procedure: LAPAROSCOPIC CHOLECYSTECTOMY WITH INTRAOPERATIVE CHOLANGIOGRAM;  Surgeon: Ralene Ok, MD;  Location: [facility];  Service: General;  Laterality: N/A;  . IR GENERIC HISTORICAL  05/03/2016   IR FLUORO GUIDE CV LINE RIGHT 05/03/2016 Corrie Mckusick, DO [facility]  . IR GENERIC HISTORICAL  05/03/2016   IR US GUIDE VASC ACCESS RIGHT 05/03/2016 Corrie Mckusick, DO [facility]  . IR GENERIC HISTORICAL  05/31/2016   IR FLUORO GUIDE CV LINE LEFT 05/31/2016 Sandi Mariscal, MD [facility]  . IR GENERIC HISTORICAL  05/31/2016   IR US GUIDE VASC ACCESS LEFT 05/31/2016 Sandi Mariscal, MD [facility]  . REPLACEMENT TOTAL KNEE Left     OB History    No data available       Home Medications  Prior to Admission medications   Medication Sig Start Date End Date Taking? Authorizing Provider  acetaminophen (TYLENOL) 325 MG tablet Take 650 mg by mouth every 6 (six) hours as needed for pain.    [provider]  alendronate (FOSAMAX) 35 MG tablet Take 35 mg by mouth every 7 (seven) days. Take with a full glass of water on an empty stomach.    [provider]  amLODipine (NORVASC) 5 MG tablet Take 5 mg by mouth daily.  04/13/16   [provider]  aspirin 81 MG tablet Take 81 mg by mouth daily.    [provider]  Cholecalciferol  (VITAMIN D3) 5000 units TABS Take 1 tablet by mouth daily.     [provider]  diphenhydrAMINE (BENADRYL) 25 MG tablet Take 50 mg by mouth at bedtime as needed for allergies or sleep.     [provider]  escitalopram (LEXAPRO) 10 MG tablet Take 10 mg by mouth daily.    [provider]  fluticasone (FLONASE) 50 MCG/ACT nasal spray Place 1 spray into [facility] nose daily as needed for allergies.  06/22/12   [provider]  loperamide (IMODIUM) 2 MG capsule Take 2 mg by mouth as needed for diarrhea or loose stools.    [provider]  omeprazole (PRILOSEC) 20 MG capsule Take 20 mg by mouth daily.  06/25/12   [provider]  prochlorperazine (COMPAZINE) 10 MG tablet Take 1 tablet (10 mg total) by mouth every 6 (six) hours as needed for nausea or vomiting. [name] not taking: Reported on 07/07/2016 05/03/16   Ladell Pier, MD  telmisartan (MICARDIS) 20 MG tablet  03/08/17   [provider]  traMADol (ULTRAM) 50 MG tablet Take 50 mg by mouth every 6 (six) hours as needed for moderate pain or severe pain.  02/02/17   [provider]  VITAMIN B1-B12 IJ Inject as directed every 30 (thirty) days.    [provider]    Family History Family History  Problem Relation Age of Onset  . Stroke Mother   . Stroke Father   . Asthma Sister     Social History Social History   Tobacco Use  . Smoking status: Never Smoker  . Smokeless tobacco: Never Used  Substance Use Topics  . Alcohol use: No  . Drug use: No     Allergies   [name] has no known allergies.   Review of Systems Review of Systems  Constitutional: Negative for chills, diaphoresis, fatigue and fever.  HENT: Negative for rhinorrhea.   Respiratory: Negative for chest tightness and shortness of breath.   Cardiovascular: Negative for chest pain.  Gastrointestinal: Negative for abdominal pain, diarrhea, nausea and vomiting.  Genitourinary: Negative for dysuria and  flank pain.  Musculoskeletal: Negative for back pain, neck pain and neck stiffness.  Skin: Negative for rash and wound.  Neurological: Negative for weakness, light-headedness, numbness and headaches.  Psychiatric/Behavioral: Negative for agitation.  All other systems reviewed and are negative.    Physical Exam Updated Vital Signs SpO2 98%   Physical Exam  Constitutional: She is oriented to person, place, and time. She appears well-developed and well-nourished. No distress.  HENT:  Head: Normocephalic and atraumatic.  Nose: Nose normal.  Mouth/Throat: Oropharynx is clear and moist. No oropharyngeal exudate.  Eyes: Conjunctivae and EOM are normal. Pupils are equal, round, and reactive to light.  Neck: Normal range of motion.  Cardiovascular: Normal rate and intact distal pulses.  No murmur heard. Pulmonary/Chest:  Effort normal and breath sounds normal. No respiratory distress. She has no wheezes. She has no rales. She exhibits no tenderness.  Abdominal: Soft. Bowel sounds are normal. She exhibits no distension. There is no tenderness.  Musculoskeletal: She exhibits tenderness. She exhibits no edema.       Left hip: She exhibits tenderness.       Legs: L leg shortening. Normal DP and PT pulses bulaterally. Normal sensation in legs. Normal plantatflexion bilaterally. Tender in L hip and thigh.   Neurological: She is alert and oriented to person, place, and time. No cranial nerve deficit or sensory deficit. She exhibits normal muscle tone.  Skin: Capillary refill takes less than 2 seconds. No rash noted. She is not diaphoretic. No erythema.  Psychiatric: She has a normal mood and affect.  Nursing note and vitals reviewed.    ED Treatments / Results  Labs (all labs ordered are listed, but only abnormal results are displayed) Labs Reviewed  BASIC METABOLIC PANEL - Abnormal; Notable for [facility] following components:      Result Value   CO2 21 (*)    Glucose, Bld 126 (*)    Creatinine,  Ser 1.53 (*)    GFR calc non Af Amer 32 (*)    GFR calc Af Amer 37 (*)    All other components within normal limits  CBC WITH DIFFERENTIAL/PLATELET - Abnormal; Notable for [facility] following components:   RBC 3.74 (*)    Hemoglobin 11.4 (*)    Platelets 401 (*)    Neutro Abs 7.8 (*)    All other components within normal limits  I-STAT CHEM 8, ED - Abnormal; Notable for [facility] following components:   Creatinine, Ser 1.30 (*)    Glucose, Bld 123 (*)    Calcium, Ion 1.12 (*)    All other components within normal limits  PROTIME-INR  CBC  TYPE AND SCREEN    EKG  EKG Interpretation  Date/Time:  Wednesday June 21 2017 20:50:39 EST Ventricular Rate:  71 PR Interval:    QRS Duration: 79 QT Interval:  451 QTC Calculation: 491 R Axis:   86 Text Interpretation:  Sinus rhythm Consider right atrial enlargement Borderline right axis deviation Borderline prolonged QT interval When compared to prior, no significnat changes seen.  No STEMI Confirmed by Antony Blackbird [phone]) on 06/22/2017 2:00:26 AM       Radiology Dg Chest Portable 1 View  Result Date: 06/21/2017 CLINICAL DATA:  Post fall.  Preoperative examination. EXAM: PORTABLE CHEST 1 VIEW COMPARISON:  01/29/2016; chest CT - 03/08/2016 FINDINGS: Grossly unchanged cardiac silhouette and mediastinal contours. Mild diffuse slightly nodular thickening of [facility] pulmonary interstitium, unchanged. No discrete focal airspace opacities. No pleural effusion or pneumothorax. Left apical pleural calcifications are unchanged. No acute osseus abnormalities. Post cholecystectomy. IMPRESSION: No acute cardiopulmonary disease. Electronically Signed   By: Sandi Mariscal M.D.   On: 06/21/2017 20:42   Dg Knee Left Port  Result Date: 06/21/2017 CLINICAL DATA:  Post fall.  History of left knee replacement. EXAM: PORTABLE LEFT KNEE - 1-2 VIEW COMPARISON:  None. FINDINGS: No definite fracture or dislocation. Post left total knee replacement without evidence of hardware  failure loosening. No knee joint effusion. Regional soft tissues appear normal. IMPRESSION: 1. No fracture or dislocation. 2. Post left total knee replacement without evidence of hardware failure or loosening. Electronically Signed   By: Sandi Mariscal M.D.   On: 06/21/2017 20:39   Dg C-arm 1-60 Min  Result Date: 06/22/2017 CLINICAL DATA:  Femur fracture EXAM: LEFT FEMUR 2 VIEWS; DG C-ARM 61-120 MIN COMPARISON:  06/21/2017 FINDINGS: Eighteen low resolution intraoperative spot views of [facility] left femur. Total fluoroscopy time was 4 minutes 24 seconds. Initial image demonstrates an angulated subtrochanteric fracture. Subsequent images demonstrate intramedullary rod fixation of [facility] left femur. A left knee replacement is noted. IMPRESSION: Intraoperative fluoroscopic assistance provided during surgical fixation of left subtrochanteric fracture Electronically Signed   By: Donavan Foil M.D.   On: 06/22/2017 01:24   Dg Hip Port Unilat W Or Wo Pelvis 1 View Left  Result Date: 06/21/2017 CLINICAL DATA:  LEFT hip pain after fall at church. History of bladder cancer, breast cancer. EXAM: DG HIP (WITH OR WITHOUT PELVIS) 1V PORT LEFT COMPARISON:  None. FINDINGS: Acute oblique fracture through proximal LEFT femur with varus angulation distal bony fragment, anteriorly directed fracture apex. Thickened lateral cortex with softening of [facility] fracture line. Focally thickened contralateral femur cortex without fracture line. Femoral heads are located. No dislocation. Soft tissue planes are not suspicious. IMPRESSION: Acute displaced proximal LEFT femur fracture, potentially related to bisphosphonate use. Recommend correlation with medication use. Focal cortical thickening contralateral femur, possibly related to bisphosphonate use. Electronically Signed   By: Elon Alas M.D.   On: 06/21/2017 20:44   Dg Femur Min 2 Views Left  Result Date: 06/22/2017 CLINICAL DATA:  Femur fracture EXAM: LEFT FEMUR 2 VIEWS; DG C-ARM 61-120  MIN COMPARISON:  06/21/2017 FINDINGS: Eighteen low resolution intraoperative spot views of C[facility]lin left femur. Total fluoroscopy time was 4 minutes 24 seconds. Initial image demonstrates an angulated subtrochanteric fracture. Subsequent images demonstrate intramedullary rod fixation of [facility] left femur. A left knee replacement is noted. IMPRESSION: Intraoperative fluoroscopic assistance provided during surgical fixation of left subtrochanteric fracture Electronically Signed   By: Donavan Foil M.D.   On: 06/22/2017 01:24    Procedures Procedures (including critical care time)  Medications Ordered in ED Medications  ceFAZolin (ANCEF) 2-4 GM/100ML-% IVPB (not administered)  amLODipine (NORVASC) tablet 5 mg (not administered)  cholecalciferol (VITAMIN D) tablet 5,000 Units (not administered)  escitalopram (LEXAPRO) tablet 20 mg (not administered)  pantoprazole (PROTONIX) EC tablet 40 mg (not administered)  irbesartan (AVAPRO) tablet 75 mg (not administered)  traMADol (ULTRAM) tablet 50 mg (not administered)  dextrose 5 %-0.45 % sodium chloride infusion (not administered)  ondansetron (ZOFRAN) tablet 4 mg (not administered)    Or  ondansetron (ZOFRAN) injection 4 mg (not administered)  alum & mag hydroxide-simeth (MAALOX/MYLANTA) 200-200-20 MG/5ML suspension 30 mL (not administered)  ferrous sulfate tablet 325 mg (not administered)  aspirin EC tablet 325 mg (not administered)  ceFAZolin (ANCEF) IVPB 2g/100 mL premix (not administered)  HYDROcodone-acetaminophen (NORCO/VICODIN) 5-325 MG per tablet 1-2 tablet (not administered)  methocarbamol (ROBAXIN) tablet 500 mg (not administered)    Or  methocarbamol (ROBAXIN) 500 mg in dextrose 5 % 50 mL IVPB (not administered)  docusate sodium (COLACE) capsule 100 mg (not administered)  polyethylene glycol (MIRALAX / GLYCOLAX) packet 17 g (not administered)  bisacodyl (DULCOLAX) EC tablet 5 mg (not administered)  magnesium citrate solution 1 Bottle (not  administered)  promethazine (PHENERGAN) injection 6.25 mg (not administered)  HYDROmorphone (DILAUDID) injection 0.5 mg (not administered)  tranexamic acid (CYKLOKAPRON) 1,000 mg in sodium chloride 0.9 % 100 mL IVPB (1,000 mg Intravenous New Bag/Given 06/22/17 0134)  fentaNYL (SUBLIMAZE) injection 50 mcg (50 mcg Intravenous Given 06/21/17 2056)     Initial Impression / Assessment and Plan / ED Course  I have reviewed [facility] triage vital signs  and [facility] nursing notes.  Pertinent labs & imaging results that were available during my care of Caitlin [name] were reviewed by me and considered in my medical decision making (see chart for details).     ELIDIA BONENFANT is a 77 y.o. female with a past medical history significant for bladder cancer, breast cancer, GERD, mitral valve prolapse, and prior total knee replacement on [facility] left who presents with a fall.  [name] reports that she was at church and had a mechanical fall landing on her left hip.  She denied any headache, neck pain, loss of consciousness or any preceding symptoms.  She reports that she was tripped while on a ramp.  She only reports pain in her left hip and thigh and denies any other locations of discomfort.  No chest pain, shortness of breath, nausea, vomiting, vision changes.  She denies any numbness, tingling, or weakness.  She describes her pain as a 2 out of 10 when still and 10 out of 10 when bending her leg at [facility] hip.  On exam, [name] has tenderness in [facility]lin left hip and thigh area.  Normal sensation, plantar flexion strength, pulses, and bilateral lower extremities.  No evidence of skin injury or open injury.  X-rays obtained showing evidence of femur fracture.  Orthopedics called who will see [name].  Orthopedics will take [name] to operating room tonight and admit her for further management.  [name] admitted to operating room in stable condition for repair of femur fracture.      Final Clinical Impressions(s) / ED Diagnoses    Final diagnoses:  Surgery, elective  Closed left subtrochanteric femur fracture [facility])    ED Discharge Orders        Ordered    telmisartan (MICARDIS) 20 MG tablet  Daily     06/22/17 0041    aspirin EC 325 MG tablet  2 times daily after meals     06/22/17 0041    HYDROcodone-acetaminophen (NORCO) 5-325 MG tablet  Every 6 hours PRN     06/22/17 0041    Partial weight bearing     06/22/17 0042     Clinical Impression: 1. Surgery, elective   2. Closed left subtrochanteric femur fracture ([place])     Disposition: Admit  This note was prepared with assistance of Dragon voice recognition software. Occasional wrong-word or sound-a-like substitutions may have occurred due to [facility] inherent limitations of voice recognition software.     Jaden Abreu, Gwenyth Allegra, MD 06/22/17 [phone]

## 2017-06-21 NOTE — Anesthesia Procedure Notes (Signed)
Procedure Name: Intubation Date/Time: 06/21/2017 10:08 PM Performed by: Claris Che, CRNA Pre-anesthesia Checklist: Patient identified, Emergency Drugs available, Suction available, Patient being monitored and Timeout performed Patient Re-evaluated:Patient Re-evaluated prior to induction Oxygen Delivery Method: Circle system utilized Preoxygenation: Pre-oxygenation with 100% oxygen Induction Type: IV induction, Rapid sequence and Cricoid Pressure applied Ventilation: Mask ventilation without difficulty Grade View: Grade II Tube type: Oral Tube size: 7.5 mm Number of attempts: 1 Airway Equipment and Method: Stylet Placement Confirmation: ETT inserted through vocal cords under direct vision,  positive ETCO2 and breath sounds checked- equal and bilateral Secured at: 23 cm Tube secured with: Tape Dental Injury: Teeth and Oropharynx as per pre-operative assessment

## 2017-06-21 NOTE — ED Notes (Signed)
ED Provider at bedside. 

## 2017-06-21 NOTE — ED Notes (Signed)
Pt transported with Dr.Graves at bedside to [facility]

## 2017-06-22 ENCOUNTER — Inpatient Hospital Stay (HOSPITAL_COMMUNITY): Payer: Medicare Other

## 2017-06-22 DIAGNOSIS — S7222XA Displaced subtrochanteric fracture of left femur, initial encounter for closed fracture: Secondary | ICD-10-CM | POA: Diagnosis present

## 2017-06-22 LAB — CBC
HEMATOCRIT: 30.7 % — AB (ref 36.0–46.0)
Hemoglobin: 9.8 g/dL — ABNORMAL LOW (ref 12.0–15.0)
MCH: 30.7 pg (ref 26.0–34.0)
MCHC: 31.9 g/dL (ref 30.0–36.0)
MCV: 96.2 fL (ref 78.0–100.0)
PLATELETS: 350 10*3/uL (ref 150–400)
RBC: 3.19 MIL/uL — ABNORMAL LOW (ref 3.87–5.11)
RDW: 14.2 % (ref 11.5–15.5)
WBC: 9.7 10*3/uL (ref 4.0–10.5)

## 2017-06-22 MED ORDER — ONDANSETRON HCL 4 MG/2ML IJ SOLN: 4.0000 mg | Freq: Four times a day (QID) | INTRAMUSCULAR | Status: DC | PRN

## 2017-06-22 MED ORDER — OXYCODONE HCL 5 MG PO TABS: 5.0000 mg | ORAL_TABLET | Freq: Once | ORAL | Status: DC | PRN

## 2017-06-22 MED ORDER — ONDANSETRON HCL 4 MG PO TABS: 4.0000 mg | ORAL_TABLET | Freq: Four times a day (QID) | ORAL | Status: DC | PRN

## 2017-06-22 MED ORDER — ASPIRIN EC 325 MG PO TBEC
325.0000 mg | DELAYED_RELEASE_TABLET | Freq: Two times a day (BID) | ORAL | Status: DC
  Administered 2017-06-22 – 2017-06-23 (×3): 325 mg via ORAL
  Filled 2017-06-22 (×3): qty 1

## 2017-06-22 MED ORDER — ALUM & MAG HYDROXIDE-SIMETH 200-200-20 MG/5ML PO SUSP: 30.0000 mL | ORAL | Status: DC | PRN

## 2017-06-22 MED ORDER — VITAMIN D 1000 UNITS PO TABS
5000.0000 [IU] | ORAL_TABLET | Freq: Every day | ORAL | Status: DC
  Administered 2017-06-22 – 2017-06-23 (×2): 5000 [IU] via ORAL
  Filled 2017-06-22 (×2): qty 5

## 2017-06-22 MED ORDER — PANTOPRAZOLE SODIUM 40 MG PO TBEC
40.0000 mg | DELAYED_RELEASE_TABLET | Freq: Every day | ORAL | Status: DC
  Administered 2017-06-22 – 2017-06-23 (×2): 40 mg via ORAL
  Filled 2017-06-22 (×2): qty 1

## 2017-06-22 MED ORDER — HYDROCODONE-ACETAMINOPHEN 5-325 MG PO TABS
1.0000 | ORAL_TABLET | Freq: Four times a day (QID) | ORAL | Status: DC | PRN
  Administered 2017-06-22 (×3): 1 via ORAL
  Filled 2017-06-22 (×3): qty 1

## 2017-06-22 MED ORDER — ESCITALOPRAM OXALATE 10 MG PO TABS
20.0000 mg | ORAL_TABLET | Freq: Every day | ORAL | Status: DC
  Administered 2017-06-22 – 2017-06-23 (×2): 20 mg via ORAL
  Filled 2017-06-22 (×2): qty 2

## 2017-06-22 MED ORDER — HYDROMORPHONE HCL 1 MG/ML IJ SOLN: 0.5000 mg | INTRAMUSCULAR | Status: DC | PRN

## 2017-06-22 MED ORDER — MAGNESIUM CITRATE PO SOLN: 1.0000 | Freq: Once | ORAL | Status: DC | PRN

## 2017-06-22 MED ORDER — CEPHALEXIN 500 MG PO CAPS
500.0000 mg | ORAL_CAPSULE | Freq: Two times a day (BID) | ORAL | Status: DC
  Administered 2017-06-22 – 2017-06-23 (×2): 500 mg via ORAL
  Filled 2017-06-22 (×3): qty 1

## 2017-06-22 MED ORDER — DEXTROSE-NACL 5-0.45 % IV SOLN
INTRAVENOUS | Status: DC
  Administered 2017-06-22: 03:00:00 via INTRAVENOUS

## 2017-06-22 MED ORDER — ONDANSETRON HCL 4 MG/2ML IJ SOLN
INTRAMUSCULAR | Status: DC | PRN
  Administered 2017-06-22: 4 mg via INTRAVENOUS

## 2017-06-22 MED ORDER — IRBESARTAN 150 MG PO TABS
75.0000 mg | ORAL_TABLET | Freq: Every day | ORAL | Status: DC
  Administered 2017-06-22 – 2017-06-23 (×2): 75 mg via ORAL
  Filled 2017-06-22 (×2): qty 1

## 2017-06-22 MED ORDER — ASPIRIN EC 325 MG PO TBEC: 325.0000 mg | DELAYED_RELEASE_TABLET | Freq: Two times a day (BID) | ORAL | 0 refills | Status: DC

## 2017-06-22 MED ORDER — TELMISARTAN 20 MG PO TABS: 20.0000 mg | ORAL_TABLET | Freq: Every day | ORAL | 0 refills | Status: DC

## 2017-06-22 MED ORDER — BUPIVACAINE HCL (PF) 0.5 % IJ SOLN
INTRAMUSCULAR | Status: DC | PRN
  Administered 2017-06-22: 20 mL

## 2017-06-22 MED ORDER — DOCUSATE SODIUM 100 MG PO CAPS
100.0000 mg | ORAL_CAPSULE | Freq: Two times a day (BID) | ORAL | Status: DC
  Administered 2017-06-22 – 2017-06-23 (×2): 100 mg via ORAL
  Filled 2017-06-22 (×3): qty 1

## 2017-06-22 MED ORDER — METHOCARBAMOL 500 MG PO TABS: 500.0000 mg | ORAL_TABLET | Freq: Four times a day (QID) | ORAL | Status: DC | PRN

## 2017-06-22 MED ORDER — HYDROCODONE-ACETAMINOPHEN 5-325 MG PO TABS: 1.0000 | ORAL_TABLET | Freq: Four times a day (QID) | ORAL | 0 refills | Status: DC | PRN

## 2017-06-22 MED ORDER — BISACODYL 5 MG PO TBEC: 5.0000 mg | DELAYED_RELEASE_TABLET | Freq: Every day | ORAL | Status: DC | PRN

## 2017-06-22 MED ORDER — TRAMADOL HCL 50 MG PO TABS
50.0000 mg | ORAL_TABLET | Freq: Four times a day (QID) | ORAL | Status: DC | PRN
  Administered 2017-06-23: 50 mg via ORAL
  Filled 2017-06-22: qty 1

## 2017-06-22 MED ORDER — METHOCARBAMOL 1000 MG/10ML IJ SOLN: 500.0000 mg | Freq: Four times a day (QID) | INTRAMUSCULAR | Status: DC | PRN

## 2017-06-22 MED ORDER — OXYCODONE HCL 5 MG/5ML PO SOLN: 5.0000 mg | Freq: Once | ORAL | Status: DC | PRN

## 2017-06-22 MED ORDER — SODIUM CHLORIDE 0.9 % IV SOLN
1000.0000 mg | Freq: Once | INTRAVENOUS | Status: AC
  Administered 2017-06-22: 1000 mg via INTRAVENOUS
  Filled 2017-06-22: qty 10

## 2017-06-22 MED ORDER — FENTANYL CITRATE (PF) 100 MCG/2ML IJ SOLN: 25.0000 ug | INTRAMUSCULAR | Status: DC | PRN

## 2017-06-22 MED ORDER — PROMETHAZINE HCL 25 MG/ML IJ SOLN
6.2500 mg | INTRAMUSCULAR | Status: DC | PRN
Start: 2017-06-22 — End: 2017-06-23

## 2017-06-22 MED ORDER — CEFAZOLIN SODIUM-DEXTROSE 2-4 GM/100ML-% IV SOLN
2.0000 g | Freq: Four times a day (QID) | INTRAVENOUS | Status: AC
  Administered 2017-06-22 (×2): 2 g via INTRAVENOUS
  Filled 2017-06-22 (×2): qty 100

## 2017-06-22 MED ORDER — FERROUS SULFATE 325 (65 FE) MG PO TABS
325.0000 mg | ORAL_TABLET | Freq: Every day | ORAL | Status: DC
  Administered 2017-06-22 – 2017-06-23 (×2): 325 mg via ORAL
  Filled 2017-06-22 (×2): qty 1

## 2017-06-22 MED ORDER — SUGAMMADEX SODIUM 200 MG/2ML IV SOLN
INTRAVENOUS | Status: DC | PRN
Start: 2017-06-22 — End: 2017-06-22
  Administered 2017-06-22: 200 mg via INTRAVENOUS

## 2017-06-22 MED ORDER — AMLODIPINE BESYLATE 5 MG PO TABS
5.0000 mg | ORAL_TABLET | Freq: Every day | ORAL | Status: DC
  Administered 2017-06-22 – 2017-06-23 (×2): 5 mg via ORAL
  Filled 2017-06-22 (×2): qty 1

## 2017-06-22 MED ORDER — ONDANSETRON HCL 4 MG/2ML IJ SOLN: 4.0000 mg | Freq: Once | INTRAMUSCULAR | Status: DC | PRN

## 2017-06-22 MED ORDER — BUPIVACAINE HCL (PF) 0.5 % IJ SOLN
INTRAMUSCULAR | Status: AC
  Filled 2017-06-22: qty 30

## 2017-06-22 MED ORDER — POLYETHYLENE GLYCOL 3350 17 G PO PACK: 17.0000 g | PACK | Freq: Every day | ORAL | Status: DC | PRN

## 2017-06-22 NOTE — Op Note (Signed)
NAMEBRAIDEN, PRESUTTI NO.:  [id number]  MEDICAL RECORD NO.:  [id number]  LOCATION:                                 FACILITY:  PHYSICIAN:  Alta Corning, M.D.   DATE OF BIRTH:  1941/02/29  DATE OF PROCEDURE:  06/21/2017 DATE OF DISCHARGE:                              OPERATIVE REPORT   PREOPERATIVE DIAGNOSIS:  Subtrochanteric femur fracture.  POSTOPERATIVE DIAGNOSIS:  Subtrochanteric femur fracture.  PROCEDURES: 1. Open reduction and internal fixation of subtrochanteric femur     fracture with intramedullary rod fixation. 2. Interpretation of multiple intraoperative fluoroscopic images.  SURGEON:  Alta Corning, MD.  ASSIST:  Gary Fleet, PA.  ANESTHESIA:  General.  BRIEF HISTORY:  Caitlin [name] is a 77 year old female with history of having been on long-term bisphosphonate therapy.  She fell earlier in the evening and suffered a transverse subtrochanteric hip fracture.  We evaluated her and felt that open reduction and internal fixation was the appropriate course of action and she was brought to the operating room for this procedure.  DESCRIPTION OF PROCEDURE:  The patient was brought to the operating room and after adequate anesthesia was obtained with general anesthetic, the patient was placed supine on the operating table.  She was then moved onto the fracture table.  Following this, she was put placed in the leg holders with the well leg holder and we then got fluoro images.  At this point, it was clear that she had a posterior subluxation of the femur and I felt that it was going to need to reduce this prior to affixing it and this was our plan.  At this point, the leg was prepped and draped in usual sterile fashion.  Following this, an incision was made over the fracture area, subcutaneous tissue down the level of the fracture.  We then used a reduction tool to try to get an anatomic reduction.  As such, a transverse fracture really could not get  it to key in and ultimately got the sag out of it to get the rotation as best we could, although we had some difficulty achieving rotational fixation and ultimately got a near reduction as we could and then following this, made an incision at the hip, dissected down to the trochanter.  Used the awl to help [place] get a guidewire straight down the femur and then opened the proximal femur with the introductory reamer.  Following this, a guidewire was placed down to the knee.  It was measured and reamed to a 10.5 mm.  A 320-mm rod, 9 mm in diameter, was then advanced across the fracture and hammered into place.  At this point, there was a slight translation of the fracture.  It was palpable inside the joint.  We backed the rod up and tried to manipulate it, but at this point I think our fixation was fixed and so at this point we put a lag screw up into the femoral head.  It was 85 x 8 mm.  This got excellent fixation in the head.  Once this was done, the distal screws were locked freehand.  It was then  a static mode.  At this time, all the wounds were irrigated thoroughly and suctioned dry.  They were closed in layers.  Sterile compressive dressing was applied throughout and the patient was taken to the recovery room where she was noted to be in satisfactory condition.  Estimated blood procedure was 300 mL with the final could be gotten from the anesthetic record.     Alta Corning, M.D.   ______________________________ Alta Corning, M.D.    Caitlin [name]  D:  06/22/2017  T:  06/22/2017  Job:  [id number]

## 2017-06-22 NOTE — Progress Notes (Signed)
Orthopedic Tech Progress Note Patient Details:  Caitlin [name] 10/16/1940 [id number]  Patient ID: Caitlin [name], female   DOB: 23-Jun-1940, 77 y.o.   MRN: [id number] Pt cant have ohf due to age restrictions  Caitlin [name] 06/22/2017, 6:13 AM

## 2017-06-22 NOTE — Transfer of Care (Signed)
Immediate Anesthesia Transfer of Care Note  Patient: Caitlin [name]  Procedure(s) Performed: INTRAMEDULLARY (IM) NAIL FEMORAL (Left Hip)  Patient Location: PACU  Anesthesia Type:General  Level of Consciousness: oriented, sedated, drowsy, patient cooperative and responds to stimulation  Airway & Oxygen Therapy: Patient Spontanous Breathing and Patient connected to nasal cannula oxygen  Post-op Assessment: Report given to RN, Post -op Vital signs reviewed and stable and Patient moving all extremities X 4  Post vital signs: Reviewed and stable  Last Vitals:  Vitals:   06/22/17 0028 06/22/17 0030  BP: (!) 170/79 (!) 170/79  Pulse: 93 88  Resp: 17 20  Temp: (!) 36.4 C   SpO2: 90% 93%    Last Pain:  Vitals:   06/21/17 2130  TempSrc:   PainSc: 2          Complications: No apparent anesthesia complications

## 2017-06-22 NOTE — Clinical Social Work Note (Signed)
Clinical Social Work Assessment  Patient Details  Name: Caitlin [name] MRN: [id number] Date of Birth: [birth year]  Date of referral:  06/22/17               Reason for consult:  Facility Placement                Permission sought to share information with:  Facility Art therapist granted to share information::  Yes, Verbal Permission Granted  Name::        Agency::  SNF  Relationship::     Contact Information:     Housing/Transportation Living arrangements for the past 2 months:  Single Family Home Source of Information:  Patient Patient Interpreter Needed:  None Criminal Activity/Legal Involvement Pertinent to Current Situation/Hospitalization:  No - Comment as needed Significant Relationships:  Other Family Members, [place] Lives with:  Self Do you feel safe going back to the place where you live?  Yes Need for family participation in patient care:  No (Coment)  Care giving concerns:  Pt resides at home with spouse. She has new impairment and may need SNF.  Pt desires to return home however if she cannot return home then we have to assist with SNF placement. Pt agreed and gave permission to CSW to send out to [facility] and [facility] CSW discussed with patient insurance authorization and the process. Pt in agreement and understands. Pt hopes to work with therapy tomorrow and be approved to go home. If she can home she indicated she desires [facility].  Social Worker assessment / plan:  CSW will assist with disposition.  Employment status:  Retired Nurse, adult PT Recommendations:  [place] / Referral to community resources:  [place]  Patient/Family's Response to care:  Patient appreciative of CSW coming to meet and discuss discharge process.   Patient/Family's Understanding of and Emotional Response to Diagnosis, Current Treatment, and Prognosis:  Pt understands her physical  limitations and barriers. Pt desires to return home if possible, however understands she may need SNF at discharge. Pt wants to work with PT tomorrow before deciding on SNF. Pt gave permission to CSW to work on SNF placement as a back up. CSW will continue to follow for disposition.  Emotional Assessment Appearance:  Appears stated age Attitude/Demeanor/Rapport:  (Cooperative) Affect (typically observed):  Accepting, Appropriate Orientation:  Oriented to Self, Oriented to Place, Oriented to Situation, Oriented to  Time Alcohol / Substance use:  Not Applicable Psych involvement (Current and /or in the community):  No (Comment)  Discharge Needs  Concerns to be addressed:  Discharge Planning Concerns Readmission within the last 30 days:  Yes Current discharge risk:  Dependent with Mobility, Physical Impairment Barriers to Discharge:  No Barriers Identified   Normajean Baxter, LCSW 06/22/2017, 4:23 PM

## 2017-06-22 NOTE — Progress Notes (Signed)
Notified Anesthesia MD and PA that the pt occasionally is having runs where her heart rate will get up to the low 120's.  PA ok to place pt on tele and continue sending her to [facility].

## 2017-06-22 NOTE — Evaluation (Signed)
Occupational Therapy Evaluation Patient Details Name: Caitlin [name] MRN: [id number] DOB: 1940/06/21 Today's Date: 06/22/2017    History of Present Illness Pt is a 77 y/o female who presetns s/p fall at home. She sustained a subtrochanteric femur fracture and is now s/p ORIF on 06/21/17. Pt is 50% PWB on the L side.    Clinical Impression   PTA Pt used SPC and was independent in ADL. Pt is very active and loves to travel. Pt is currently max A for LB ADL and set up for UB ADL. Pt very motivated and pleasant throughout session. Able to perform transfers at min A level with +2 for safety this session and exclaimed over and over again afterwards "I feel so much better after moving!". OT will continue to follow in the acute setting and afterwards at the [facility] level to maximize safety and independence in ADL and functional transfers. Next session to focus on bed mobility (where she needed the most help this session) and AE education (please take kit for demo)    Follow Up Recommendations  Home health OT;Supervision/Assistance - 24 hour    Equipment Recommendations  3 in 1 bedside commode    Recommendations for Other Services       Precautions / Restrictions Precautions Precautions: Fall Restrictions Weight Bearing Restrictions: Yes LLE Weight Bearing: Partial weight bearing LLE Partial Weight Bearing Percentage or Pounds: 50      Mobility Bed Mobility Overal bed mobility: Needs Assistance Bed Mobility: Supine to Sit     Supine to sit: Min assist;+2 for safety/equipment;HOB elevated     General bed mobility comments: +2 assist provided for LLE advancement towards EOB and scooting assist with the bed pad. Pt reports she was "stuck" to the bed pad and had difficulty maneuvering hips towards EOB.   Transfers Overall transfer level: Needs assistance Equipment used: Rolling walker (2 wheeled) Transfers: Sit to/from Stand Sit to Stand: Min assist;+2 safety/equipment         General  transfer comment: VC's for hand placement on seated surface for safety. Pt was able to power-up to full standing position with light +2 assist for boost up and transition with hands to grab walker.     Balance Overall balance assessment: Needs assistance Sitting-balance support: Feet supported;No upper extremity supported Sitting balance-Leahy Scale: Good     Standing balance support: Bilateral upper extremity supported;During functional activity Standing balance-Leahy Scale: Poor Standing balance comment: Reliant on RW for support and maintenance of PWB precautions                           ADL either performed or assessed with clinical judgement   ADL Overall ADL's : Needs assistance/impaired Eating/Feeding: Modified independent   Grooming: Set up   Upper Body Bathing: Supervision/ safety   Lower Body Bathing: Maximal assistance;With caregiver independent assisting;Sitting/lateral leans;With adaptive equipment Lower Body Bathing Details (indicate cue type and reason): verbally educated in long handle sponge Upper Body Dressing : Set up;Sitting   Lower Body Dressing: Maximal assistance;With adaptive equipment Lower Body Dressing Details (indicate cue type and reason): verbally educated in grabber, unable to bring foot cross to knees for LB dressing "but that's what I was doing before this happened" Toilet Transfer: Minimal assistance;+2 for safety/equipment;Ambulation Toilet Transfer Details (indicate cue type and reason): with RW Toileting- Clothing Manipulation and Hygiene: Minimal assistance Toileting - Clothing Manipulation Details (indicate cue type and reason): at baseline, Pt cath's herself (in and out)  she does this while sitting on the toilet     Functional mobility during ADLs: Minimal assistance;+2 for safety/equipment;Rolling walker;Cueing for sequencing       Vision Baseline Vision/History: Wears glasses Wears Glasses: Reading only Patient Visual  Report: No change from baseline Vision Assessment?: No apparent visual deficits     Perception     Praxis      Pertinent Vitals/Pain Pain Assessment: 0-10 Pain Score: 1  Pain Location: incision site Pain Descriptors / Indicators: Operative site guarding;Discomfort(Pulling) Pain Intervention(s): Monitored during session;Repositioned     Hand Dominance Right   Extremity/Trunk Assessment Upper Extremity Assessment Upper Extremity Assessment: Overall WFL for tasks assessed   Lower Extremity Assessment Lower Extremity Assessment: LLE deficits/detail LLE Deficits / Details: Decreased strength and AROM consistent with above mentioned procedure.    Cervical / Trunk Assessment Cervical / Trunk Assessment: Kyphotic   Communication Communication Communication: No difficulties   Cognition Arousal/Alertness: Awake/alert Behavior During Therapy: WFL for tasks assessed/performed Overall Cognitive Status: Within Functional Limits for tasks assessed                                     General Comments  husband and son present at beginning of session, left prior to bed exit, ADL and ambulation    Exercises     Shoulder Instructions      Home Living Family/patient expects to be discharged to:: Private residence Living Arrangements: Spouse/significant other Available Help at Discharge: Family;Available 24 hours/day Type of Home: House Home Access: Stairs to enter [employer] of Steps: 3 Entrance Stairs-Rails: Left;Right;Can reach both Home Layout: One level     Bathroom Shower/Tub: Occupational psychologist: Handicapped height Bathroom Accessibility: Yes   Home Equipment: Shower seat - built in;Walker - 2 wheels;Cane - single point;Hand held shower head          Prior Functioning/Environment Level of Independence: Independent with assistive device(s)        Comments: used SPC since Sept        OT Problem List: Decreased range of  motion;Decreased activity tolerance;Impaired balance (sitting and/or standing);Decreased knowledge of use of DME or AE;Decreased knowledge of precautions;Pain      OT Treatment/Interventions: Self-care/ADL training;DME and/or AE instruction;Therapeutic activities;Patient/family education;Balance training    OT Goals(Current goals can be found in the care plan section) Acute Rehab OT Goals Patient Stated Goal: Home at d/c; back to PLOF OT Goal Formulation: With patient Time For Goal Achievement: 07/06/17 Potential to Achieve Goals: Good ADL Goals Pt Will Perform Grooming: with supervision;standing Pt Will Perform Lower Body Bathing: with supervision;with caregiver independent in assisting;with adaptive equipment;sit to/from stand Pt Will Perform Lower Body Dressing: with supervision;with caregiver independent in assisting;with adaptive equipment;sit to/from stand Pt Will Transfer to Toilet: with supervision;ambulating Pt Will Perform Toileting - Clothing Manipulation and hygiene: with modified independence;sitting/lateral leans  OT Frequency: Min 2X/week   Barriers to D/C:            Co-evaluation PT/OT/SLP Co-Evaluation/Treatment: Yes Reason for Co-Treatment: For patient/therapist safety;To address functional/ADL transfers PT goals addressed during session: Mobility/safety with mobility OT goals addressed during session: ADL's and self-care      AM-PAC PT "6 Clicks" Daily Activity     Outcome Measure Help from another person eating meals?: None Help from another person taking care of personal grooming?: None(in seated position) Help from another person toileting, which includes using toliet, bedpan,  or urinal?: A Lot Help from another person bathing (including washing, rinsing, drying)?: A Lot Help from another person to put on and taking off regular upper body clothing?: None Help from another person to put on and taking off regular lower body clothing?: A Lot 6 Click Score:  18   End of Session Equipment Utilized During Treatment: Gait belt;Rolling walker Nurse Communication: Mobility status  Activity Tolerance: Patient tolerated treatment well Patient left: in chair;with call bell/phone within reach;with chair alarm set  OT Visit Diagnosis: Unsteadiness on feet (R26.81);Other abnormalities of gait and mobility (R26.89)                Time: 1101-1140 OT Time Calculation (min): 39 min Charges:  OT General Charges $OT Visit: 1 Visit OT Evaluation $OT Eval Moderate Complexity: 1 Mod G-Codes:     Hulda Humphrey OTR/L [place] 06/22/2017, 4:07 PM

## 2017-06-22 NOTE — Evaluation (Signed)
Physical Therapy Evaluation Patient Details Name: Caitlin [name] MRN: [id number] DOB: [birth year] Today's Date: 06/22/2017   History of Present Illness  Pt is a 77 y/o female who presetns s/p fall at home. She sustained a subtrochanteric femur fracture and is now s/p ORIF on 06/21/17. Pt is 50% PWB on the L side.   Clinical Impression  Pt admitted with above diagnosis. Pt currently with functional limitations due to the deficits listed below (see PT Problem List). At the time of PT eval pt was able to perform transfers and ambulation with gross min guard to heavy min assist for balance support and safety. Anticipate pt will progress well with mobility and will be appropriate for d/c home with HHPT services to follow. Will initiate stair training tomorrow as able. Pt will benefit from skilled PT to increase their independence and safety with mobility to allow discharge to the venue listed below.     Follow Up Recommendations Home health PT;Supervision/Assistance - 24 hour    Equipment Recommendations  None recommended by PT    Recommendations for Other Services       Precautions / Restrictions Precautions Precautions: Fall Restrictions Weight Bearing Restrictions: Yes LLE Weight Bearing: Partial weight bearing LLE Partial Weight Bearing Percentage or Pounds: 50      Mobility  Bed Mobility Overal bed mobility: Needs Assistance Bed Mobility: Supine to Sit     Supine to sit: Min assist;+2 for safety/equipment;HOB elevated     General bed mobility comments: +2 assist provided for LLE advancement towards EOB and scooting assist with the bed pad. Pt reports she was "stuck" to the bed pad and had difficulty maneuvering hips towards EOB.   Transfers Overall transfer level: Needs assistance Equipment used: Rolling walker (2 wheeled) Transfers: Sit to/from Stand Sit to Stand: Min assist;+2 safety/equipment         General transfer comment: VC's for hand placement on seated surface for  safety. Pt was able to power-up to full standing position with light +2 assist for boost up and transition with hands to grab walker.   Ambulation/Gait Ambulation/Gait assistance: Min guard Ambulation Distance (Feet): 35 Feet Assistive device: Rolling walker (2 wheeled) Gait Pattern/deviations: Step-to pattern;Decreased stride length;Trunk flexed Gait velocity: Decreased Gait velocity interpretation: Below normal speed for age/gender General Gait Details: VC's for improved posture as well as sequencing with the walker and maintenance of PWB status on the LLE. Pt did not require assistance however moving slowly.   Stairs            Wheelchair Mobility    Modified Rankin (Stroke Patients Only)       Balance Overall balance assessment: Needs assistance Sitting-balance support: Feet supported;No upper extremity supported Sitting balance-Leahy Scale: Good     Standing balance support: Bilateral upper extremity supported;During functional activity Standing balance-Leahy Scale: Poor Standing balance comment: Reliant on RW for support and maintenance of PWB precautions                             Pertinent Vitals/Pain Pain Assessment: 0-10 Pain Score: 1  Pain Location: incision site Pain Descriptors / Indicators: Operative site guarding;Discomfort(Pulling) Pain Intervention(s): Limited activity within patient's tolerance;Monitored during session;Repositioned    Home Living Family/patient expects to be discharged to:: Private residence Living Arrangements: Spouse/significant other Available Help at Discharge: Family;Available 24 hours/day Type of Home: House Home Access: Stairs to enter Entrance Stairs-Rails: Left;Right;Can reach both Entrance Stairs-Number of Steps: 3 Home Layout:  One level Home Equipment: Shower seat - built in;Walker - 2 wheels;Cane - single point;Hand held shower head      Prior Function Level of Independence: Independent with assistive  device(s)         Comments: used SPC since Sept     Hand Dominance   Dominant Hand: Right    Extremity/Trunk Assessment   Upper Extremity Assessment Upper Extremity Assessment: Overall WFL for tasks assessed    Lower Extremity Assessment Lower Extremity Assessment: LLE deficits/detail LLE Deficits / Details: Decreased strength and AROM consistent with above mentioned procedure.     Cervical / Trunk Assessment Cervical / Trunk Assessment: Kyphotic  Communication   Communication: No difficulties  Cognition Arousal/Alertness: Awake/alert Behavior During Therapy: WFL for tasks assessed/performed Overall Cognitive Status: Within Functional Limits for tasks assessed                                        General Comments      Exercises General Exercises - Lower Extremity Ankle Circles/Pumps: 10 reps Quad Sets: 10 reps   Assessment/Plan    PT Assessment Patient needs continued PT services  PT Problem List Decreased strength;Decreased range of motion;Decreased activity tolerance;Decreased balance;Decreased mobility;Decreased knowledge of use of DME;Decreased safety awareness;Decreased knowledge of precautions;Pain       PT Treatment Interventions DME instruction;Gait training;Stair training;Functional mobility training;Therapeutic activities;Therapeutic exercise;Neuromuscular re-education;Patient/family education    PT Goals (Current goals can be found in the Care Plan section)  Acute Rehab PT Goals Patient Stated Goal: Home at d/c; back to PLOF PT Goal Formulation: With patient Time For Goal Achievement: 07/06/17 Potential to Achieve Goals: Good    Frequency Min 5X/week   Barriers to discharge        Co-evaluation PT/OT/SLP Co-Evaluation/Treatment: Yes Reason for Co-Treatment: To address functional/ADL transfers;For patient/therapist safety PT goals addressed during session: Mobility/safety with mobility;Balance;Proper use of  DME;Strengthening/ROM         AM-PAC PT "6 Clicks" Daily Activity  Outcome Measure Difficulty turning over in bed (including adjusting bedclothes, sheets and blankets)?: A Little Difficulty moving from lying on back to sitting on the side of the bed? : A Lot Difficulty sitting down on and standing up from a chair with arms (e.g., wheelchair, bedside commode, etc,.)?: A Lot Help needed moving to and from a bed to chair (including a wheelchair)?: A Little Help needed walking in hospital room?: A Little Help needed climbing 3-5 steps with a railing? : A Lot 6 Click Score: 15    End of Session Equipment Utilized During Treatment: Gait belt Activity Tolerance: Patient tolerated treatment well Patient left: in chair;with call bell/phone within reach;with chair alarm set Nurse Communication: Mobility status PT Visit Diagnosis: Unsteadiness on feet (R26.81);Pain;Other abnormalities of gait and mobility (R26.89) Pain - Right/Left: Left Pain - part of body: Hip    Time: 1101-1140 PT Time Calculation (min) (ACUTE ONLY): 39 min   Charges:   PT Evaluation $PT Eval Moderate Complexity: 1 Mod PT Treatments $Gait Training: 8-22 mins   PT G Codes:        Rolinda Roan, PT, DPT Acute Rehabilitation Services Pager: [phone]   Thelma Comp 06/22/2017, 12:18 PM

## 2017-06-22 NOTE — Brief Op Note (Signed)
06/21/2017 - 06/22/2017  12:15 AM  PATIENT:  Caitlin [name]  77 y.o. female  PRE-OPERATIVE DIAGNOSIS:  left femur fracture  POST-OPERATIVE DIAGNOSIS:  left femur fracture  PROCEDURE:  Procedure(s): INTRAMEDULLARY (IM) NAIL FEMORAL (Left)  SURGEON:  Surgeon(s) and Role:    Dorna Leitz, MD - Primary  PHYSICIAN ASSISTANT:   ASSISTANTS: bethune   ANESTHESIA:   general  EBL:  400 mL   BLOOD ADMINISTERED:none  DRAINS: none   LOCAL MEDICATIONS USED:  MARCAINE     SPECIMEN:  No Specimen  DISPOSITION OF SPECIMEN:  N/A  COUNTS:  YES  TOURNIQUET:  * No tourniquets in log *  DICTATION: .Other Dictation: Dictation Number [id number]  PLAN OF CARE: Admit to inpatient   PATIENT DISPOSITION:  PACU - hemodynamically stable.   Delay start of Pharmacological VTE agent (>24hrs) due to surgical blood loss or risk of bleeding: no

## 2017-06-22 NOTE — Progress Notes (Signed)
Subjective: 1 Day Post-Op Procedure(s) (LRB): INTRAMEDULLARY (IM) NAIL FEMORAL (Left) Patient reports pain as mild.  Denies chest pain, shortness of breath, or palpitations. Overall has no significant complaints.  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  Objective: Vital signs in last 24 hours: Temp:  [97.5 F (36.4 C)-97.9 F (36.6 C)] 97.7 F (36.5 C) (01/24 0329) Pulse Rate:  [67-93] 72 (01/24 0329) Resp:  [12-22] 20 (01/24 0329) BP: (119-170)/(62-97) 149/62 (01/24 0329) SpO2:  [90 %-100 %] 100 % (01/24 0329)  Intake/Output from previous day: 01/23 0701 - 01/24 0700 In: 2050 [I.V.:1700; IV Piggyback:250] Out: 1835 [Urine:1435; Blood:400] Intake/Output this shift: Total I/O In: 240 [P.O.:240] Out: -   Recent Labs    06/21/17 2112 06/21/17 2135 06/22/17 0755  HGB 11.4* 12.2 9.8*   Recent Labs    06/21/17 2112 06/21/17 2135 06/22/17 0755  WBC 9.1  --  9.7  RBC 3.74*  --  3.19*  HCT 36.2 36.0 30.7*  PLT 401*  --  350   Recent Labs    06/21/17 2112 06/21/17 2135  NA 139 141  K 3.8 3.8  CL 106 105  CO2 21*  --   BUN 19 20  CREATININE 1.53* 1.30*  GLUCOSE 126* 123*  CALCIUM 8.9  --    Recent Labs    06/21/17 2112  INR 0.96  left hip exam:  Neurovascular intact Sensation intact distally Intact pulses distally Incision: dressing C/D/I Compartment soft  Assessment/Plan: 1 Day Post-Op Procedure(s) (LRB): INTRAMEDULLARY (IM) NAIL FEMORAL (Left)  Plan: 50% partial weightbearing on left lower extremity. Aspirin 325 mg enteric-coated twice daily along with SCDs for DVT prophylaxis. Up with therapy Discharge to SNF In a couple of days.We will leave the Foley catheter in place one more night because of her history of needing to do recurrent in and out catheterizations.I will cover her with oral antibiotics.  [place]  G 06/22/2017, 9:43 AM

## 2017-06-22 NOTE — Anesthesia Postprocedure Evaluation (Signed)
Anesthesia Post Note  Patient: Caitlin [name]  Procedure(s) Performed: INTRAMEDULLARY (IM) NAIL FEMORAL (Left Hip)     Patient location during evaluation: PACU Anesthesia Type: General Level of consciousness: awake and alert Pain management: pain level controlled Vital Signs Assessment: post-procedure vital signs reviewed and stable Respiratory status: spontaneous breathing, nonlabored ventilation, respiratory function stable and patient connected to nasal cannula oxygen Cardiovascular status: blood pressure returned to baseline and stable Postop Assessment: no apparent nausea or vomiting Anesthetic complications: no    Last Vitals:  Vitals:   06/22/17 1517 06/22/17 2012  BP: (!) 105/52 (!) 132/56  Pulse: 80 72  Resp: 17 16  Temp: 36.8 C 36.7 C  SpO2: 98% 99%    Last Pain:  Vitals:   06/22/17 2012  TempSrc: Oral  PainSc:                  [name]

## 2017-06-22 NOTE — NC FL2 (Signed)
[place] LEVEL OF CARE SCREENING TOOL     IDENTIFICATION  Patient Name: Caitlin [name] Birthdate: November 06, 1940 Sex: female Admission Date (Current Location): 06/21/2017  [facility] and [place] Number:  Herbalist and Address:  [facility], [place] [address], [place], [place] [address]      Provider Number: [id number]  Attending Physician Name and Address:  Dorna Leitz, MD  Relative Name and Phone Number:  Neita Landrigan, son, [phone]    Current Level of Care: Hospital Recommended Level of Care: [place] Prior Approval Number:    Date Approved/Denied:   PASRR Number: [id number]  Discharge Plan: SNF    Current Diagnoses: Patient Active Problem List   Diagnosis Date Noted  . Closed left subtrochanteric femur fracture ([place]) 06/22/2017  . Femur fracture ([place]) 06/21/2017  . 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 Hyperkalemia 01/27/2016  . Acute blood loss anemia 01/27/2016  . Acute renal insufficiency 01/26/2016  . Urethral caruncle 01/26/2016  . Urinary tract infection 01/25/2016  . PAC (premature atrial contraction) 11/29/2012  . DOE (dyspnea on exertion) 11/29/2012  . Orthostatic hypotension 10/10/2012  . Dizziness 10/10/2012  . UTI (lower urinary tract infection) 10/10/2012  . HTN (hypertension) 10/10/2012    Orientation RESPIRATION BLADDER Height & Weight     Self, Time, Situation, Place  Normal Continent Weight:   Height:     BEHAVIORAL SYMPTOMS/MOOD NEUROLOGICAL BOWEL NUTRITION STATUS      Continent Diet(See DC Summary)  AMBULATORY STATUS COMMUNICATION OF NEEDS Skin   Limited Assist Verbally Surgical wounds                       Personal Care Assistance Level of Assistance  Feeding, Bathing, Dressing Bathing Assistance: Maximum assistance Feeding assistance: Independent Dressing Assistance: Maximum assistance     Functional Limitations Info  Sight, Hearing, Speech Sight Info:  Adequate Hearing Info: Adequate Speech Info: Adequate    SPECIAL CARE FACTORS FREQUENCY  PT (By licensed PT), OT (By licensed OT)     PT Frequency: 5x week OT Frequency: 5x week            Contractures      Additional Factors Info  Code Status, Allergies Code Status Info: Full Allergies Info: No Known Allergies           Current Medications (06/22/2017):  This is the current hospital active medication list Current Facility-Administered Medications  Medication Dose Route Frequency Provider Last Rate Last Dose  . alum & mag hydroxide-simeth (MAALOX/MYLANTA) 200-200-20 MG/5ML suspension 30 mL  30 mL Oral Q4H PRN Gary Fleet, PA-C      . amLODipine (NORVASC) tablet 5 mg  5 mg Oral Daily Gary Fleet, PA-C   5 mg at 06/22/17 0830  . aspirin EC tablet 325 mg  325 mg Oral BID PC Gary Fleet, PA-C   325 mg at 06/22/17 0830  . bisacodyl (DULCOLAX) EC tablet 5 mg  5 mg Oral Daily PRN Gary Fleet, PA-C      . cephALEXin (KEFLEX) capsule 500 mg  500 mg Oral Q12H Gary Fleet, PA-C      . cholecalciferol (VITAMIN D) tablet 5,000 Units  5,000 Units Oral Daily Gary Fleet, PA-C   5,000 Units at 06/22/17 0831  . dextrose 5 %-0.45 % sodium chloride infusion   Intravenous Continuous Gary Fleet, PA-C 75 mL/hr at 06/22/17 0308    . docusate sodium (COLACE) capsule 100 mg  100 mg Oral  BID Gary Fleet, PA-C      . escitalopram (LEXAPRO) tablet 20 mg  20 mg Oral Daily Gary Fleet, PA-C   20 mg at 06/22/17 0831  . ferrous sulfate tablet 325 mg  325 mg Oral Q breakfast Gary Fleet, PA-C   325 mg at 06/22/17 5284  . HYDROcodone-acetaminophen (NORCO/VICODIN) 5-325 MG per tablet 1-2 tablet  1-2 tablet Oral Q6H PRN Gary Fleet, PA-C   1 tablet at 06/22/17 1101  . HYDROmorphone (DILAUDID) injection 0.5 mg  0.5 mg Intravenous Q4H PRN Gary Fleet, PA-C      . irbesartan (AVAPRO) tablet 75 mg  75 mg Oral Daily Gary Fleet, PA-C   75 mg at 06/22/17 1324  . magnesium  citrate solution 1 Bottle  1 Bottle Oral Once PRN Gary Fleet, PA-C      . methocarbamol (ROBAXIN) tablet 500 mg  500 mg Oral Q6H PRN Gary Fleet, PA-C       Or  . methocarbamol (ROBAXIN) 500 mg in dextrose 5 % 50 mL IVPB  500 mg Intravenous Q6H PRN Gary Fleet, PA-C      . ondansetron [facility]) tablet 4 mg  4 mg Oral Q6H PRN Gary Fleet, PA-C       Or  . ondansetron [facility]) injection 4 mg  4 mg Intravenous Q6H PRN Gary Fleet, PA-C      . pantoprazole (PROTONIX) EC tablet 40 mg  40 mg Oral Daily Gary Fleet, PA-C   40 mg at 06/22/17 0831  . polyethylene glycol (MIRALAX / GLYCOLAX) packet 17 g  17 g Oral Daily PRN Gary Fleet, PA-C      . promethazine (PHENERGAN) injection 6.25 mg  6.25 mg Intravenous Q4H PRN Gary Fleet, PA-C      . traMADol [facility]) tablet 50 mg  50 mg Oral Q6H PRN Gary Fleet, PA-C         Discharge Medications: Please see discharge summary for a list of discharge medications.  Relevant Imaging Results:  Relevant Lab Results:   Additional Information SS#: [id number]  [place], LCSW

## 2017-06-23 DIAGNOSIS — D62 Acute posthemorrhagic anemia: Secondary | ICD-10-CM

## 2017-06-23 LAB — CBC
HCT: 26.6 % — ABNORMAL LOW (ref 36.0–46.0)
HEMOGLOBIN: 8.5 g/dL — AB (ref 12.0–15.0)
MCH: 30.6 pg (ref 26.0–34.0)
MCHC: 32 g/dL (ref 30.0–36.0)
MCV: 95.7 fL (ref 78.0–100.0)
PLATELETS: 316 10*3/uL (ref 150–400)
RBC: 2.78 MIL/uL — ABNORMAL LOW (ref 3.87–5.11)
RDW: 14.4 % (ref 11.5–15.5)
WBC: 8.2 10*3/uL (ref 4.0–10.5)

## 2017-06-23 LAB — BASIC METABOLIC PANEL
Anion gap: 11 (ref 5–15)
BUN: 22 mg/dL — AB (ref 6–20)
CALCIUM: 8.7 mg/dL — AB (ref 8.9–10.3)
CO2: 20 mmol/L — AB (ref 22–32)
CREATININE: 1.39 mg/dL — AB (ref 0.44–1.00)
Chloride: 107 mmol/L (ref 101–111)
GFR calc Af Amer: 41 mL/min — ABNORMAL LOW (ref >60)
GFR, EST NON AFRICAN AMERICAN: 36 mL/min — AB (ref >60)
GLUCOSE: 136 mg/dL — AB (ref 65–99)
Potassium: 3.8 mmol/L (ref 3.5–5.1)
Sodium: 138 mmol/L (ref 135–145)

## 2017-06-23 NOTE — Progress Notes (Signed)
Per patient started having problems with foley catheter leaking on day shift.Patient leaking around foley catheter and patient stating she thinks catheter is clogged up doesn't feel bladder is being emptied.Patient requesting catheter be removed tonight and to be in and cath per normal routine at home.Call placed to [facility].

## 2017-06-23 NOTE — Progress Notes (Signed)
Occupational Therapy Treatment Patient Details Name: Caitlin [name] MRN: [id number] DOB: [birth year] Today's Date: 06/23/2017    History of present illness Pt is a 77 y/o female who presetns s/p fall at home. She sustained a subtrochanteric femur fracture and is now s/p ORIF on 06/21/17. Pt is 50% PWB on the L side.    OT comments  All education is complete and patient indicates understanding. Pt at adequate level for d/c home    Follow Up Recommendations  Home health OT;Supervision/Assistance - 24 hour    Equipment Recommendations  3 in 1 bedside commode    Recommendations for Other Services      Precautions / Restrictions Precautions Precautions: Fall Restrictions Weight Bearing Restrictions: Yes LLE Weight Bearing: Partial weight bearing LLE Partial Weight Bearing Percentage or Pounds: 50       Mobility Bed Mobility Overal bed mobility: Needs Assistance Bed Mobility: Supine to Sit     Supine to sit: Supervision     General bed mobility comments: educated on sheet as leg lifter. pt plans to use at night and to help with transfer. pt with previous hx of leg lifter   Transfers Overall transfer level: Needs assistance Equipment used: Rolling walker (2 wheeled) Transfers: Sit to/from Stand Sit to Stand: Min guard         General transfer comment: cues for hand placement    Balance Overall balance assessment: Needs assistance Sitting-balance support: Feet supported;No upper extremity supported Sitting balance-Leahy Scale: Good     Standing balance support: Bilateral upper extremity supported;During functional activity Standing balance-Leahy Scale: Poor Standing balance comment: Reliant on RW for support and maintenance of PWB precautions                           ADL either performed or assessed with clinical judgement   ADL Overall ADL's : Needs assistance/impaired     Grooming: Set up       Lower Body Bathing: Minimal assistance        Lower Body Dressing: Minimal assistance Lower Body Dressing Details (indicate cue type and reason): pt has reacher at home and educated on using for LB dressing. Pts spouse says "i can help her. we can do it" pt able to reach to just above ankle at this time. pt educated on dressing the injuried side first  Toilet Transfer: Supervision/safety;RW           Functional mobility during ADLs: Supervision/safety;Rolling walker General ADL Comments: pt is able to maintain TDWB during session and spouse (A) throughout session     Vision       Perception     Praxis      Cognition Arousal/Alertness: Awake/alert Behavior During Therapy: WFL for tasks assessed/performed Overall Cognitive Status: Within Functional Limits for tasks assessed                                          Exercises Exercises: General Lower Extremity General Exercises - Lower Extremity Ankle Circles/Pumps: 10 reps Quad Sets: 10 reps Short Arc Quad: 10 reps Long Arc Quad: 10 reps Hip ABduction/ADduction: 10 reps   Shoulder Instructions       General Comments      Pertinent Vitals/ Pain       Pain Assessment: No/denies pain Pain Intervention(s): Monitored during session  Home Living  Prior Functioning/Environment              Frequency  Min 2X/week        Progress Toward Goals  OT Goals(current goals can now be found in the care plan section)  Progress towards OT goals: Progressing toward goals  Acute Rehab OT Goals Patient Stated Goal: Home at d/c; back to PLOF OT Goal Formulation: With patient Time For Goal Achievement: 07/06/17 Potential to Achieve Goals: Good ADL Goals Pt Will Perform Grooming: with supervision;standing Pt Will Perform Lower Body Bathing: with supervision;with caregiver independent in assisting;with adaptive equipment;sit to/from stand Pt Will Perform Lower Body Dressing: with  supervision;with caregiver independent in assisting;with adaptive equipment;sit to/from stand Pt Will Transfer to Toilet: with supervision;ambulating Pt Will Perform Toileting - Clothing Manipulation and hygiene: with modified independence;sitting/lateral leans  Plan Discharge plan remains appropriate    Co-evaluation                 AM-PAC PT "6 Clicks" Daily Activity     Outcome Measure   Help from another person eating meals?: None Help from another person taking care of personal grooming?: None Help from another person toileting, which includes using toliet, bedpan, or urinal?: A Little Help from another person bathing (including washing, rinsing, drying)?: A Little Help from another person to put on and taking off regular upper body clothing?: None Help from another person to put on and taking off regular lower body clothing?: A Little 6 Click Score: 21    End of Session Equipment Utilized During Treatment: Gait belt;Rolling walker  OT Visit Diagnosis: Unsteadiness on feet (R26.81);Other abnormalities of gait and mobility (R26.89)   Activity Tolerance Patient tolerated treatment well   Patient Left in chair;with call bell/phone within reach;with family/visitor present   Nurse Communication Mobility status;Precautions        Time: 1113-1130 OT Time Calculation (min): 17 min  Charges:     Jeri Modena   OTR/L Pager: [phone] Office: [phone] .    Parke Poisson B 06/23/2017, 4:02 PM

## 2017-06-23 NOTE — Progress Notes (Signed)
Subjective: 2 Days Post-Op Procedure(s) (LRB): INTRAMEDULLARY (IM) NAIL FEMORAL (Left) Patient reports pain as mild.  The patient wanted her Foley removed last night because she normally does her own in catheterizations.  The Foley was removed last night.  She denies dizziness, chest pain, or shortness of breath.  She has progressed well with physical therapy and reports that she ambulated in the hall yesterday.  She would like to go home today with home health physical therapy.  Objective: Vital signs in last 24 hours: Temp:  [97.6 F (36.4 C)-98.3 F (36.8 C)] 97.9 F (36.6 C) (01/25 0832) Pulse Rate:  [70-80] 70 (01/25 0832) Resp:  [16-18] 18 (01/25 0832) BP: (105-132)/(52-61) 112/53 (01/25 0832) SpO2:  [98 %-100 %] 100 % (01/25 0832)  Intake/Output from previous day: 01/24 0701 - 01/25 0700 In: 2438.8 [P.O.:1380; I.V.:1058.8] Out: 2250 [Urine:2250] Intake/Output this shift: Total I/O In: 240 [P.O.:240] Out: 25 [Urine:25]  Recent Labs    06/21/17 2112 06/21/17 2135 06/22/17 0755 06/23/17 0018  HGB 11.4* 12.2 9.8* 8.5*   Recent Labs    06/22/17 0755 06/23/17 0018  WBC 9.7 8.2  RBC 3.19* 2.78*  HCT 30.7* 26.6*  PLT 350 316   Recent Labs    06/21/17 2112 06/21/17 2135 06/23/17 0018  NA 139 141 138  K 3.8 3.8 3.8  CL 106 105 107  CO2 21*  --  20*  BUN 19 20 22*  CREATININE 1.53* 1.30* 1.39*  GLUCOSE 126* 123* 136*  CALCIUM 8.9  --  8.7*   Recent Labs    06/21/17 2112  INR 0.96  Left hip/lower extremity exam:  Neurovascular intact Sensation intact distally Intact pulses distally Dorsiflexion/Plantar flexion intact Incision: dressing C/D/I Compartment soft  Assessment/Plan: 2 Days Post-Op Procedure(s) (LRB): INTRAMEDULLARY (IM) NAIL FEMORAL (Left) Acute blood loss anemia, expected, asymptomatic. Plan: She will take aspirin 325 mg enteric-coated twice daily for DVT prophylaxis times 1 month. She will take ferrous sulfate 325 mg 1 daily. Partial  weightbearing 50% left lower extremity. Up with therapy Discharge home with home health. Follow-up with Dr. Berenice Primas in 2 weeks.  Wilson Dusenbery G 06/23/2017, 10:27 AM

## 2017-06-23 NOTE — Discharge Summary (Signed)
Patient ID: Caitlin [name] MRN: [id number] DOB/AGE: [birth year] 77 y.o.  Admit date: 06/21/2017 Discharge date: 06/23/2017  Admission Diagnoses:  Principal Problem:   Closed left subtrochanteric femur fracture ([place]) Active Problems:   Postoperative anemia due to acute blood loss   Discharge Diagnoses:  Same  Past Medical History:  Diagnosis Date  . Arthritis   . 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 Bladder cancer ([place])   . Breast cancer ([place])   . Chronic kidney disease    does not empty bladder,self cath  . Complication of anesthesia   . GERD (gastroesophageal reflux disease)   . History of radiation therapy 05/02/16-06/07/16   pelvis 55 Gy in 25 fractions  . Hypertension   . Mitral valve prolapse   . Personal history of radiation therapy 2005  . PONV (postoperative nausea and vomiting)   . Self-catheterizes urinary bladder     Surgeries: Procedure(s): Left INTRAMEDULLARY (IM) NAIL FEMORAL on 06/21/2017 - 06/22/2017   Discharged Condition: Improved  Hospital Course: Caitlin [name] is an 77 y.o. female who was admitted 06/21/2017 for operative treatment ofClosed left subtrochanteric femur fracture ([place]). Patient has severe unremitting pain that affects sleep, daily activities, and work/hobbies. After pre-op clearance the patient was taken to the operating room on 06/21/2017 - 06/22/2017 and underwent  Procedure(s): Left INTRAMEDULLARY (IM) NAIL FEMORAL.    Patient was given perioperative antibiotics:  Anti-infectives (From admission, onward)   Start     Dose/Rate Route Frequency Ordered Stop   06/22/17 1800  cephALEXin (KEFLEX) capsule 500 mg     500 mg Oral Every 12 hours 06/22/17 0947     06/22/17 0400  ceFAZolin (ANCEF) IVPB 2g/100 mL premix     2 g 200 mL/hr over 30 Minutes Intravenous Every 6 hours 06/22/17 0125 06/22/17 0833   06/21/17 2153  ceFAZolin (ANCEF) 2-4 GM/100ML-% IVPB    Comments:  Gala Lewandowsky   : cabinet override      06/21/17 2153 06/22/17 0959        Patient was given sequential compression devices, early ambulation, and chemoprophylaxis to prevent DVT.  The patient was given tranexamic acid postoperatively.  She was seen by physical therapy for walker ambulation 50% partial weightbearing on the left lower extremity.  She progressed very well as physical therapy.  A Foley catheter was placed at the time of surgery.  It was left in overnight later the following day on postop day #1 late in the evening the Foley catheter was discontinued.  The patient normally does in and out catheters on her own for some post-residual problems she has.  The patient denied dizziness, shortness of breath, or chest pain during the hospitalization.  On the date of discharge her vital signs were stable, she was afebrile, and she reported she was ready to go home.  Patient benefited maximally from hospital stay and there were no complications.    Recent vital signs:  Patient Vitals for the past 24 hrs:  BP Temp Temp src Pulse Resp SpO2  06/23/17 1400 (!) 127/50 98 F (36.7 C) Oral 88 18 96 %  06/23/17 0832 (!) 112/53 97.9 F (36.6 C) Oral 70 18 100 %  06/23/17 0636 130/61 97.6 F (36.4 C) Oral 77 18 99 %  06/22/17 2012 (!) 132/56 98 F (36.7 C) Oral 72 16 99 %  06/22/17 1517 (!) 105/52 98.3 F (36.8 C) Oral 80 17 98 %     Recent laboratory studies:  Recent Labs  06/21/17 2112 06/21/17 2135 06/22/17 0755 06/23/17 0018  WBC 9.1  --  9.7 8.2  HGB 11.4* 12.2 9.8* 8.5*  HCT 36.2 36.0 30.7* 26.6*  PLT 401*  --  350 316  NA 139 141  --  138  K 3.8 3.8  --  3.8  CL 106 105  --  107  CO2 21*  --   --  20*  BUN 19 20  --  22*  CREATININE 1.53* 1.30*  --  1.39*  GLUCOSE 126* 123*  --  136*  INR 0.96  --   --   --   CALCIUM 8.9  --   --  8.7*     Discharge Medications:   Allergies as of 06/23/2017   No Known Allergies     Medication List    STOP taking these medications   alendronate 35 MG tablet Commonly known as:  FOSAMAX     TAKE these  medications   acetaminophen 325 MG tablet Commonly known as:  TYLENOL Take 650 mg by mouth every 6 (six) hours as needed for pain.   amLODipine 5 MG tablet Commonly known as:  NORVASC Take 5 mg by mouth daily.   aspirin EC 325 MG tablet Take 1 tablet (325 mg total) by mouth 2 (two) times daily after a meal. Take x 1 month post op to decrease risk of blood clots. What changed:    medication strength  how much to take  when to take this  additional instructions  Another medication with the same name was removed. Continue taking this medication, and follow the directions you see here.   BENGAY ULTRA STRENGTH EX Apply 1 application topically as needed (hip pain).   cetirizine 10 MG tablet Commonly known as:  ZYRTEC Take 10 mg by mouth daily.   diphenhydrAMINE 25 MG tablet Commonly known as:  BENADRYL Take 50 mg by mouth at bedtime as needed for allergies or sleep.   escitalopram 10 MG tablet Commonly known as:  LEXAPRO Take 10 mg by mouth daily.   fluticasone 50 MCG/ACT nasal spray Commonly known as:  FLONASE Place 1 spray into the nose daily as needed for allergies.   HYDROcodone-acetaminophen 5-325 MG tablet Commonly known as:  NORCO Take 1-2 tablets by mouth every 6 (six) hours as needed for moderate pain.   loperamide 2 MG capsule Commonly known as:  IMODIUM Take 2 mg by mouth as needed for diarrhea or loose stools.   multivitamin with minerals Tabs tablet Take 1 tablet by mouth daily.   omeprazole 20 MG capsule Commonly known as:  PRILOSEC Take 20 mg by mouth daily.   PROBIOTIC ACIDOPHILUS PO Take 1 capsule by mouth daily.   prochlorperazine 10 MG tablet Commonly known as:  COMPAZINE Take 1 tablet (10 mg total) by mouth every 6 (six) hours as needed for nausea or vomiting.   telmisartan 20 MG tablet Commonly known as:  MICARDIS Take 1 tablet (20 mg total) by mouth daily. What changed:    how much to take  how to take this  when to take this    traMADol 50 MG tablet Commonly known as:  ULTRAM Take 50 mg by mouth every 6 (six) hours as needed for moderate pain or severe pain.   VITAMIN B1-B12 IJ Inject as directed every 30 (thirty) days.   Vitamin D3 5000 units Tabs Take 1 tablet by mouth daily.            Discharge Care Instructions  (  From admission, onward)        Start     Ordered   06/23/17 0000  Partial weight bearing    Question Answer Comment  % Body Weight 50%   Laterality left   Extremity Lower      06/23/17 1035   06/22/17 0000  Partial weight bearing    Question Answer Comment  % Body Weight 50%   Laterality left   Extremity Lower      06/22/17 0042    The patient was faxed in a prescription for Robaxin 750 mg 1 every 8 hours as needed for pain/spasm.  She also was instructed to take ferrous sulfate 325 mg 1 daily for a couple of weeks.  Diagnostic Studies: Dg Chest Portable 1 View  Result Date: 06/21/2017 CLINICAL DATA:  Post fall.  Preoperative examination. EXAM: PORTABLE CHEST 1 VIEW COMPARISON:  01/29/2016; chest CT - 03/08/2016 FINDINGS: Grossly unchanged cardiac silhouette and mediastinal contours. Mild diffuse slightly nodular thickening of the pulmonary interstitium, unchanged. No discrete focal airspace opacities. No pleural effusion or pneumothorax. Left apical pleural calcifications are unchanged. No acute osseus abnormalities. Post cholecystectomy. IMPRESSION: No acute cardiopulmonary disease. Electronically Signed   By: Sandi Mariscal M.D.   On: 06/21/2017 20:42   Dg Knee Left Port  Result Date: 06/21/2017 CLINICAL DATA:  Post fall.  History of left knee replacement. EXAM: PORTABLE LEFT KNEE - 1-2 VIEW COMPARISON:  None. FINDINGS: No definite fracture or dislocation. Post left total knee replacement without evidence of hardware failure loosening. No knee joint effusion. Regional soft tissues appear normal. IMPRESSION: 1. No fracture or dislocation. 2. Post left total knee replacement without  evidence of hardware failure or loosening. Electronically Signed   By: Sandi Mariscal M.D.   On: 06/21/2017 20:39   Dg C-arm 1-60 Min  Result Date: 06/22/2017 CLINICAL DATA:  Femur fracture EXAM: LEFT FEMUR 2 VIEWS; DG C-ARM 61-120 MIN COMPARISON:  06/21/2017 FINDINGS: Eighteen low resolution intraoperative spot views of the left femur. Total fluoroscopy time was 4 minutes 24 seconds. Initial image demonstrates an angulated subtrochanteric fracture. Subsequent images demonstrate intramedullary rod fixation of the left femur. A left knee replacement is noted. IMPRESSION: Intraoperative fluoroscopic assistance provided during surgical fixation of left subtrochanteric fracture Electronically Signed   By: Donavan Foil M.D.   On: 06/22/2017 01:24   Dg Hip Port Unilat W Or Wo Pelvis 1 View Left  Result Date: 06/21/2017 CLINICAL DATA:  LEFT hip pain after fall at church. History of bladder cancer, breast cancer. EXAM: DG HIP (WITH OR WITHOUT PELVIS) 1V PORT LEFT COMPARISON:  None. FINDINGS: Acute oblique fracture through proximal LEFT femur with varus angulation distal bony fragment, anteriorly directed fracture apex. Thickened lateral cortex with softening of the fracture line. Focally thickened contralateral femur cortex without fracture line. Femoral heads are located. No dislocation. Soft tissue planes are not suspicious. IMPRESSION: Acute displaced proximal LEFT femur fracture, potentially related to bisphosphonate use. Recommend correlation with medication use. Focal cortical thickening contralateral femur, possibly related to bisphosphonate use. Electronically Signed   By: Elon Alas M.D.   On: 06/21/2017 20:44   Dg Femur Min 2 Views Left  Result Date: 06/22/2017 CLINICAL DATA:  Femur fracture EXAM: LEFT FEMUR 2 VIEWS; DG C-ARM 61-120 MIN COMPARISON:  06/21/2017 FINDINGS: Eighteen low resolution intraoperative spot views of the left femur. Total fluoroscopy time was 4 minutes 24 seconds. Initial  image demonstrates an angulated subtrochanteric fracture. Subsequent images demonstrate intramedullary rod fixation of the left  femur. A left knee replacement is noted. IMPRESSION: Intraoperative fluoroscopic assistance provided during surgical fixation of left subtrochanteric fracture Electronically Signed   By: Donavan Foil M.D.   On: 06/22/2017 01:24    Disposition: 01-Home or Self Care  Discharge Instructions    Call MD / Call 911   Complete by:  As directed    If you experience chest pain or shortness of breath, CALL 911 and be transported to the hospital emergency room.  If you develope a fever above 101 F, pus (white drainage) or increased drainage or redness at the wound, or calf pain, call your surgeon's office.   Constipation Prevention   Complete by:  As directed    Drink plenty of fluids.  Prune juice may be helpful.  You may use a stool softener, such as Colace (over the counter) 100 mg twice a day.  Use MiraLax (over the counter) for constipation as needed.   Face-to-face encounter (required for Medicare/Medicaid patients)   Complete by:  As directed    I Philana Younis G certify that this patient is under my care and that I, or a nurse practitioner or physician's assistant working with me, had a face-to-face encounter that meets the physician face-to-face encounter requirements with this patient on 06/23/2017. The encounter with the patient was in whole, or in part for the following medical condition(s) which is the primary reason for home health care (List medical condition): s/p orif of left femur fracture   The encounter with the patient was in whole, or in part, for the following medical condition, which is the primary reason for home health care:  s/p orif left femur fracture   I certify that, based on my findings, the following services are medically necessary home health services:  Physical therapy   Reason for Medically Necessary Home Health Services:   Therapy- Personnel officer,  Public librarian Therapy- Therapeutic Exercises to Increase Strength and Endurance     My clinical findings support the need for the above services:   Unable to leave home safely without assistance and/or assistive device Pain interferes with ambulation/mobility     Further, I certify that my clinical findings support that this patient is homebound due to:   Unable to leave home safely without assistance Ambulates short distances less than 300 feet     Home Health   Complete by:  As directed    To provide the following care/treatments:  PT   Increase activity slowly as tolerated   Complete by:  As directed    Partial weight bearing   Complete by:  As directed    % Body Weight:  50%   Laterality:  left   Extremity:  Lower   Partial weight bearing   Complete by:  As directed    % Body Weight:  50%   Laterality:  left   Extremity:  Lower      Follow-up Information    Dorna Leitz, MD. Schedule an appointment as soon as possible for a visit in 2 week(s).   Specialty:  Orthopedic Surgery Contact information: [place] [address] [phone]            Signed: Erlene Senters 06/23/2017, 3:04 PM

## 2017-06-23 NOTE — Progress Notes (Signed)
Pt discharged home with family per order. [facility] set up, no DME needed per CM. AVS and scripts reviewed and given to patient. All belongings sent with patient. VSS. BP (!) 127/50 (BP Location: Right Arm)   Pulse 88   Temp 98 F (36.7 C) (Oral)   Resp 18   SpO2 96%

## 2017-06-23 NOTE — Progress Notes (Signed)
Physical Therapy Treatment Patient Details Name: Caitlin [name] MRN: [id number] DOB: 1941-05-08 Today's Date: 06/23/2017    History of Present Illness Pt is a 77 y/o female who presetns s/p fall at home. She sustained a subtrochanteric femur fracture and is now s/p ORIF on 06/21/17. Pt is 50% PWB on the L side.     PT Comments    Pt progressing towards physical therapy goals. Was able to perform transfers and ambulation with gross min guard assist for balance support and safety with RW. She completed stair training with husband present for education as well. Pt is safe to d/c home today with family support. Will continue to follow until d/c to maximize functional independence and safety.   Follow Up Recommendations  Home health PT;Supervision/Assistance - 24 hour     Equipment Recommendations  None recommended by PT    Recommendations for Other Services       Precautions / Restrictions Precautions Precautions: Fall Restrictions Weight Bearing Restrictions: Yes LLE Weight Bearing: Partial weight bearing LLE Partial Weight Bearing Percentage or Pounds: 50    Mobility  Bed Mobility               General bed mobility comments: Pt received sitting up in recliner chair.   Transfers Overall transfer level: Needs assistance Equipment used: Rolling walker (2 wheeled) Transfers: Sit to/from Stand Sit to Stand: Min guard         General transfer comment: Close guard for safety. VC's for hand placement on seated surface for safety.   Ambulation/Gait Ambulation/Gait assistance: Min guard Ambulation Distance (Feet): 200 Feet Assistive device: Rolling walker (2 wheeled) Gait Pattern/deviations: Step-to pattern;Step-through pattern;Decreased stride length;Trunk flexed Gait velocity: Decreased Gait velocity interpretation: Below normal speed for age/gender General Gait Details: VC's for improved posture and forward gaze. Pt initially with step-to pattern progressing to  step-through pattern.    Stairs Stairs: Yes   Stair Management: Two rails;Step to pattern;Forwards Number of Stairs: 10(5x2) General stair comments: VC's for sequencing and general safety with stair negotiation. Pt maintained weight bearing precautions well. Husband present for education also.   Wheelchair Mobility    Modified Rankin (Stroke Patients Only)       Balance Overall balance assessment: Needs assistance Sitting-balance support: Feet supported;No upper extremity supported Sitting balance-Leahy Scale: Good     Standing balance support: Bilateral upper extremity supported;During functional activity Standing balance-Leahy Scale: Poor Standing balance comment: Reliant on RW for support and maintenance of PWB precautions                            Cognition Arousal/Alertness: Awake/alert Behavior During Therapy: WFL for tasks assessed/performed Overall Cognitive Status: Within Functional Limits for tasks assessed                                        Exercises General Exercises - Lower Extremity Ankle Circles/Pumps: 10 reps Quad Sets: 10 reps Short Arc Quad: 10 reps Long Arc Quad: 10 reps Hip ABduction/ADduction: 10 reps    General Comments        Pertinent Vitals/Pain Pain Assessment: No/denies pain Pain Intervention(s): Monitored during session    Home Living                      Prior Function  PT Goals (current goals can now be found in the care plan section) Acute Rehab PT Goals Patient Stated Goal: Home at d/c; back to PLOF PT Goal Formulation: With patient Time For Goal Achievement: 07/06/17 Potential to Achieve Goals: Good Progress towards PT goals: Progressing toward goals    Frequency    Min 5X/week      PT Plan Current plan remains appropriate    Co-evaluation              AM-PAC PT "6 Clicks" Daily Activity  Outcome Measure  Difficulty turning over in bed (including  adjusting bedclothes, sheets and blankets)?: A Little Difficulty moving from lying on back to sitting on the side of the bed? : A Lot Difficulty sitting down on and standing up from a chair with arms (e.g., wheelchair, bedside commode, etc,.)?: A Lot Help needed moving to and from a bed to chair (including a wheelchair)?: A Little Help needed walking in hospital room?: A Little Help needed climbing 3-5 steps with a railing? : A Lot 6 Click Score: 15    End of Session Equipment Utilized During Treatment: Gait belt Activity Tolerance: Patient tolerated treatment well Patient left: in chair;with call bell/phone within reach;with chair alarm set Nurse Communication: Mobility status PT Visit Diagnosis: Unsteadiness on feet (R26.81);Pain;Other abnormalities of gait and mobility (R26.89) Pain - Right/Left: Left Pain - part of body: Hip     Time: 3149-7026 PT Time Calculation (min) (ACUTE ONLY): 43 min  Charges:  $Gait Training: 23-37 mins $Therapeutic Exercise: 8-22 mins                    G Codes:       Rolinda Roan, PT, DPT Acute Rehabilitation Services Pager: [place] 06/23/2017, 1:26 PM

## 2017-06-25 LAB — TYPE AND SCREEN
ABO/RH(D): A NEG
ANTIBODY SCREEN: POSITIVE
DONOR AG TYPE: NEGATIVE
Donor AG Type: NEGATIVE
Unit division: 0
Unit division: 0

## 2017-06-25 LAB — BPAM RBC
Blood Product Expiration Date: 201902062359
Blood Product Expiration Date: 201902062359
Unit Type and Rh: 600
Unit Type and Rh: 600

## 2017-06-27 ENCOUNTER — Encounter (HOSPITAL_COMMUNITY): Payer: Self-pay | Admitting: Orthopedic Surgery

## 2017-06-27 NOTE — OR Nursing (Signed)
Late entry due to correction of time entry.

## 2017-06-30 ENCOUNTER — Emergency Department (HOSPITAL_COMMUNITY): Payer: Medicare Other

## 2017-06-30 ENCOUNTER — Telehealth: Payer: Self-pay | Admitting: Cardiovascular Disease

## 2017-06-30 ENCOUNTER — Inpatient Hospital Stay (HOSPITAL_COMMUNITY)
Admission: EM | Admit: 2017-06-30 | Discharge: 2017-07-03 | DRG: 176 | Disposition: A | Discharge status: Home Health | Payer: Medicare Other | Attending: Internal Medicine | Admitting: Internal Medicine

## 2017-06-30 ENCOUNTER — Other Ambulatory Visit: Payer: Self-pay

## 2017-06-30 ENCOUNTER — Encounter (HOSPITAL_COMMUNITY): Payer: Self-pay | Admitting: *Deleted

## 2017-06-30 DIAGNOSIS — Z96652 Presence of left artificial knee joint: Secondary | ICD-10-CM | POA: Diagnosis present

## 2017-06-30 DIAGNOSIS — N189 Chronic kidney disease, unspecified: Secondary | ICD-10-CM | POA: Diagnosis present

## 2017-06-30 DIAGNOSIS — Z853 Personal history of malignant neoplasm of breast: Secondary | ICD-10-CM | POA: Diagnosis not present

## 2017-06-30 DIAGNOSIS — Z906 Acquired absence of other parts of urinary tract: Secondary | ICD-10-CM

## 2017-06-30 DIAGNOSIS — Z9221 Personal history of antineoplastic chemotherapy: Secondary | ICD-10-CM | POA: Diagnosis not present

## 2017-06-30 DIAGNOSIS — K219 Gastro-esophageal reflux disease without esophagitis: Secondary | ICD-10-CM | POA: Diagnosis present

## 2017-06-30 DIAGNOSIS — I341 Nonrheumatic mitral (valve) prolapse: Secondary | ICD-10-CM | POA: Diagnosis present

## 2017-06-30 DIAGNOSIS — Z8551 Personal history of malignant neoplasm of bladder: Secondary | ICD-10-CM

## 2017-06-30 DIAGNOSIS — Z7982 Long term (current) use of aspirin: Secondary | ICD-10-CM

## 2017-06-30 DIAGNOSIS — I2609 Other pulmonary embolism with acute cor pulmonale: Principal | ICD-10-CM | POA: Diagnosis present

## 2017-06-30 DIAGNOSIS — N390 Urinary tract infection, site not specified: Secondary | ICD-10-CM | POA: Diagnosis present

## 2017-06-30 DIAGNOSIS — Z79899 Other long term (current) drug therapy: Secondary | ICD-10-CM | POA: Diagnosis not present

## 2017-06-30 DIAGNOSIS — I82409 Acute embolism and thrombosis of unspecified deep veins of unspecified lower extremity: Secondary | ICD-10-CM

## 2017-06-30 DIAGNOSIS — I272 Pulmonary hypertension, unspecified: Secondary | ICD-10-CM | POA: Diagnosis present

## 2017-06-30 DIAGNOSIS — I82432 Acute embolism and thrombosis of left popliteal vein: Secondary | ICD-10-CM | POA: Diagnosis present

## 2017-06-30 DIAGNOSIS — Z923 Personal history of irradiation: Secondary | ICD-10-CM

## 2017-06-30 DIAGNOSIS — I129 Hypertensive chronic kidney disease with stage 1 through stage 4 chronic kidney disease, or unspecified chronic kidney disease: Secondary | ICD-10-CM | POA: Diagnosis present

## 2017-06-30 DIAGNOSIS — I82442 Acute embolism and thrombosis of left tibial vein: Secondary | ICD-10-CM | POA: Diagnosis present

## 2017-06-30 DIAGNOSIS — M1990 Unspecified osteoarthritis, unspecified site: Secondary | ICD-10-CM | POA: Diagnosis present

## 2017-06-30 DIAGNOSIS — R0602 Shortness of breath: Secondary | ICD-10-CM | POA: Diagnosis not present

## 2017-06-30 DIAGNOSIS — I10 Essential (primary) hypertension: Secondary | ICD-10-CM | POA: Diagnosis present

## 2017-06-30 DIAGNOSIS — I2699 Other pulmonary embolism without acute cor pulmonale: Secondary | ICD-10-CM | POA: Diagnosis not present

## 2017-06-30 DIAGNOSIS — S7222XA Displaced subtrochanteric fracture of left femur, initial encounter for closed fracture: Secondary | ICD-10-CM | POA: Diagnosis present

## 2017-06-30 DIAGNOSIS — I361 Nonrheumatic tricuspid (valve) insufficiency: Secondary | ICD-10-CM | POA: Diagnosis not present

## 2017-06-30 LAB — CBC WITH DIFFERENTIAL/PLATELET
BASOS ABS: 0 10*3/uL (ref 0.0–0.1)
BASOS PCT: 0 %
EOS PCT: 2 %
Eosinophils Absolute: 0.2 10*3/uL (ref 0.0–0.7)
HEMATOCRIT: 29.7 % — AB (ref 36.0–46.0)
Hemoglobin: 9.3 g/dL — ABNORMAL LOW (ref 12.0–15.0)
LYMPHS PCT: 16 %
Lymphs Abs: 1.5 10*3/uL (ref 0.7–4.0)
MCH: 30.5 pg (ref 26.0–34.0)
MCHC: 31.3 g/dL (ref 30.0–36.0)
MCV: 97.4 fL (ref 78.0–100.0)
MONO ABS: 0.5 10*3/uL (ref 0.1–1.0)
MONOS PCT: 5 %
NEUTROS ABS: 7.1 10*3/uL (ref 1.7–7.7)
Neutrophils Relative %: 77 %
PLATELETS: 458 10*3/uL — AB (ref 150–400)
RBC: 3.05 MIL/uL — ABNORMAL LOW (ref 3.87–5.11)
RDW: 15.3 % (ref 11.5–15.5)
WBC: 9.3 10*3/uL (ref 4.0–10.5)

## 2017-06-30 LAB — COMPREHENSIVE METABOLIC PANEL
ALT: 28 U/L (ref 14–54)
ANION GAP: 13 (ref 5–15)
AST: 38 U/L (ref 15–41)
Albumin: 3.1 g/dL — ABNORMAL LOW (ref 3.5–5.0)
Alkaline Phosphatase: 99 U/L (ref 38–126)
BUN: 18 mg/dL (ref 6–20)
CHLORIDE: 109 mmol/L (ref 101–111)
CO2: 20 mmol/L — ABNORMAL LOW (ref 22–32)
Calcium: 9.1 mg/dL (ref 8.9–10.3)
Creatinine, Ser: 1.41 mg/dL — ABNORMAL HIGH (ref 0.44–1.00)
GFR, EST AFRICAN AMERICAN: 40 mL/min — AB (ref >60)
GFR, EST NON AFRICAN AMERICAN: 35 mL/min — AB (ref >60)
Glucose, Bld: 98 mg/dL (ref 65–99)
POTASSIUM: 3.4 mmol/L — AB (ref 3.5–5.1)
Sodium: 142 mmol/L (ref 135–145)
TOTAL PROTEIN: 6.9 g/dL (ref 6.5–8.1)
Total Bilirubin: 0.6 mg/dL (ref 0.3–1.2)

## 2017-06-30 LAB — MAGNESIUM: Magnesium: 2.1 mg/dL (ref 1.7–2.4)

## 2017-06-30 LAB — BRAIN NATRIURETIC PEPTIDE: B NATRIURETIC PEPTIDE 5: 251.6 pg/mL — AB (ref 0.0–100.0)

## 2017-06-30 LAB — I-STAT TROPONIN, ED: TROPONIN I, POC: 0.07 ng/mL (ref 0.00–0.08)

## 2017-06-30 MED ORDER — IOPAMIDOL (ISOVUE-370) INJECTION 76%
INTRAVENOUS | Status: AC
  Administered 2017-06-30: 100 mL
  Filled 2017-06-30: qty 100

## 2017-06-30 MED ORDER — PANTOPRAZOLE SODIUM 40 MG PO TBEC
40.0000 mg | DELAYED_RELEASE_TABLET | Freq: Every day | ORAL | Status: DC
  Administered 2017-07-01 – 2017-07-03 (×3): 40 mg via ORAL
  Filled 2017-06-30 (×3): qty 1

## 2017-06-30 MED ORDER — ONDANSETRON HCL 4 MG/2ML IJ SOLN: 4.0000 mg | Freq: Four times a day (QID) | INTRAMUSCULAR | Status: DC | PRN

## 2017-06-30 MED ORDER — HEPARIN (PORCINE) IN NACL 100-0.45 UNIT/ML-% IJ SOLN
1100.0000 [IU]/h | INTRAMUSCULAR | Status: DC
  Administered 2017-06-30 – 2017-07-01 (×2): 1100 [IU]/h via INTRAVENOUS
  Filled 2017-06-30 (×2): qty 250

## 2017-06-30 MED ORDER — LORATADINE 10 MG PO TABS
10.0000 mg | ORAL_TABLET | Freq: Every day | ORAL | Status: DC
  Administered 2017-07-01 – 2017-07-03 (×3): 10 mg via ORAL
  Filled 2017-06-30 (×3): qty 1

## 2017-06-30 MED ORDER — HEPARIN BOLUS VIA INFUSION
4000.0000 [IU] | Freq: Once | INTRAVENOUS | Status: AC
  Administered 2017-06-30: 4000 [IU] via INTRAVENOUS
  Filled 2017-06-30: qty 4000

## 2017-06-30 MED ORDER — HYDROCODONE-ACETAMINOPHEN 5-325 MG PO TABS
1.0000 | ORAL_TABLET | Freq: Four times a day (QID) | ORAL | Status: DC | PRN
  Administered 2017-07-01 – 2017-07-02 (×2): 1 via ORAL
  Filled 2017-06-30 (×2): qty 1

## 2017-06-30 MED ORDER — FUROSEMIDE 20 MG PO TABS
20.0000 mg | ORAL_TABLET | Freq: Every day | ORAL | Status: DC
  Administered 2017-07-01 – 2017-07-03 (×3): 20 mg via ORAL
  Filled 2017-06-30 (×3): qty 1

## 2017-06-30 MED ORDER — IPRATROPIUM-ALBUTEROL 0.5-2.5 (3) MG/3ML IN SOLN
3.0000 mL | Freq: Four times a day (QID) | RESPIRATORY_TRACT | Status: DC
  Administered 2017-07-01 – 2017-07-02 (×4): 3 mL via RESPIRATORY_TRACT
  Filled 2017-06-30 (×4): qty 3

## 2017-06-30 MED ORDER — ACETAMINOPHEN 325 MG PO TABS
650.0000 mg | ORAL_TABLET | Freq: Four times a day (QID) | ORAL | Status: DC | PRN
  Administered 2017-07-03: 650 mg via ORAL
  Filled 2017-06-30: qty 2

## 2017-06-30 MED ORDER — FERROUS SULFATE 325 (65 FE) MG PO TABS
325.0000 mg | ORAL_TABLET | Freq: Every day | ORAL | Status: DC
  Administered 2017-07-01 – 2017-07-03 (×3): 325 mg via ORAL
  Filled 2017-06-30 (×3): qty 1

## 2017-06-30 MED ORDER — ONDANSETRON HCL 4 MG PO TABS: 4.0000 mg | ORAL_TABLET | Freq: Four times a day (QID) | ORAL | Status: DC | PRN

## 2017-06-30 MED ORDER — AMLODIPINE BESYLATE 5 MG PO TABS
5.0000 mg | ORAL_TABLET | Freq: Every day | ORAL | Status: DC
  Administered 2017-07-01 – 2017-07-03 (×3): 5 mg via ORAL
  Filled 2017-06-30 (×3): qty 1

## 2017-06-30 MED ORDER — POTASSIUM CHLORIDE CRYS ER 20 MEQ PO TBCR
40.0000 meq | EXTENDED_RELEASE_TABLET | Freq: Once | ORAL | Status: AC
  Administered 2017-06-30: 40 meq via ORAL
  Filled 2017-06-30: qty 2

## 2017-06-30 MED ORDER — IPRATROPIUM BROMIDE 0.02 % IN SOLN: 0.5000 mg | Freq: Four times a day (QID) | RESPIRATORY_TRACT | Status: DC

## 2017-06-30 MED ORDER — LEVOFLOXACIN IN D5W 750 MG/150ML IV SOLN
750.0000 mg | INTRAVENOUS | Status: DC
  Administered 2017-06-30: 750 mg via INTRAVENOUS
  Filled 2017-06-30: qty 150

## 2017-06-30 MED ORDER — ALBUTEROL SULFATE (2.5 MG/3ML) 0.083% IN NEBU: 2.5000 mg | INHALATION_SOLUTION | Freq: Four times a day (QID) | RESPIRATORY_TRACT | Status: DC

## 2017-06-30 MED ORDER — ZOLPIDEM TARTRATE 5 MG PO TABS
5.0000 mg | ORAL_TABLET | Freq: Every evening | ORAL | Status: DC | PRN
  Administered 2017-06-30 – 2017-07-02 (×3): 5 mg via ORAL
  Filled 2017-06-30 (×3): qty 1

## 2017-06-30 NOTE — ED Notes (Signed)
Per main lab will add on magnesium to sample available in lab

## 2017-06-30 NOTE — Telephone Encounter (Signed)
Spoke with Clair Gulling, nurse with [facility] who states he spoke with Dr. Berenice Primas, PCP who advised patient go to the ED. I thanked him for the call.

## 2017-06-30 NOTE — Progress Notes (Signed)
Pharmacy Antibiotic Note  Caitlin [name] is a 77 y.o. female admitted on 06/30/2017 with UTI.  Pharmacy has been consulted for levaquin dosing. Pt is afebrile and WBC is WNL. SCr is 1.41.   Plan: Levaquin 750mg  IV Q48H F/u renal fxn, C&S, clinical status   Height: 5\' 2"  (157.5 cm) Weight: 165 lb (74.8 kg) IBW/kg (Calculated) : 50.1  Temp (24hrs), Avg:97.5 F (36.4 C), Min:97.5 F (36.4 C), Max:97.5 F (36.4 C)  Recent Labs  Lab 06/30/17 1741  WBC 9.3  CREATININE 1.41*    Estimated Creatinine Clearance: 31.6 mL/min (A) (by C-G formula based on SCr of 1.41 mg/dL (H)).    No Known Allergies  Antimicrobials this admission: Levaquin 2/1>>  Dose adjustments this admission: N/A  Microbiology results: Pending  Thank you for allowing pharmacy to be a part of this patient's care.  Amer Alcindor, Rande Lawman 06/30/2017 9:50 PM

## 2017-06-30 NOTE — Telephone Encounter (Signed)
New message   Call from [facility]  [phone], calling with concerns of high BP, SOB and intermittent nose bleeds, sweaty,  82% on room air. Please call   Pt c/o BP issue: STAT if pt c/o blurred vision, one-sided weakness or slurred speech  1. What are your last 5 BP readings? 220/100, 180/90  2. Are you having any other symptoms (ex. Dizziness, headache, blurred vision, passed out)? no  3. What is your BP issue? High BP

## 2017-06-30 NOTE — ED Notes (Signed)
Admitting at the bedside.  

## 2017-06-30 NOTE — ED Notes (Signed)
ED Provider at bedside. 

## 2017-06-30 NOTE — H&P (Signed)
[facility]                                                                                    Patient Demographics  Caitlin [name], is a 77 y.o. female  CSN: [id number]  MRN: [id number]  DOB - Jan 06, 1941  Admit Date - 06/30/2017  Outpatient Primary MD for the patient is Avva, Steva Ready, MD   With History of -  Past Medical History:  Diagnosis Date  . Arthritis   . 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 Bladder cancer ([place])   . Breast cancer ([place])   . Chronic kidney disease    does not empty bladder,self cath  . Complication of anesthesia   . GERD (gastroesophageal reflux disease)   . History of radiation therapy 05/02/16-06/07/16   pelvis 55 Gy in 25 fractions  . Hypertension   . Mitral valve prolapse   . Personal history of radiation therapy 2005  . PONV (postoperative nausea and vomiting)   . Self-catheterizes urinary bladder       Past Surgical History:  Procedure Laterality Date  . ABDOMINAL HYSTERECTOMY  1980   total  . ANTERIOR AND POSTERIOR REPAIR N/A 01/25/2016   Procedure: Excision of suburethral mass;  Surgeon: Irine Seal, MD;  Location: [facility];  Service: Urology;  Laterality: N/A;  . BLADDER SURGERY  2009   due to cancer  . BREAST BIOPSY Left 2005  . BREAST LUMPECTOMY Left 2005  . BREAST SURGERY  2005   Left Breast Lumpectomy  . CHOLECYSTECTOMY N/A 02/19/2013   Procedure: LAPAROSCOPIC CHOLECYSTECTOMY WITH INTRAOPERATIVE CHOLANGIOGRAM;  Surgeon: Ralene Ok, MD;  Location: [place];  Service: General;  Laterality: N/A;  . FEMUR IM NAIL Left 06/21/2017   Procedure: INTRAMEDULLARY (IM) NAIL FEMORAL;  Surgeon: Dorna Leitz, MD;  Location: [place];  Service: Orthopedics;  Laterality: Left;  . IR GENERIC HISTORICAL  05/03/2016   IR FLUORO GUIDE CV LINE RIGHT 05/03/2016 Corrie Mckusick, DO [facility]  . IR GENERIC HISTORICAL  05/03/2016   IR US GUIDE VASC ACCESS RIGHT 05/03/2016 Corrie Mckusick, DO [facility]  . IR GENERIC HISTORICAL  05/31/2016   IR  FLUORO GUIDE CV LINE LEFT 05/31/2016 Sandi Mariscal, MD [facility]  . IR GENERIC HISTORICAL  05/31/2016   IR US GUIDE VASC ACCESS LEFT 05/31/2016 Sandi Mariscal, MD [facility]  . REPLACEMENT TOTAL KNEE Left     in for   No chief complaint on file.    HPI  Caitlin [name]  is a 77 y.o. female, who recently underwent left femur repair, on January 22, intramedullary nail, presenting today with 2 days history of shortness of breath, nausea, mild chest discomfort, and some leg swelling. Patient reports mild fever but no chills yesterday. CT of the chest showed bilateral PEs and patient will be admitted for IV heparin.    Review of Systems    In addition to the HPI above,  No Headache, No changes with Vision or hearing, No problems swallowing food or Liquids, No Abdominal pain,  Bowel movements are regular, No Blood in stool or Urine, No dysuria, No new skin rashes or bruises, No new joints  pains-aches,  No new weakness, tingling, numbness in any extremity, No recent weight gain or loss, No polyuria, polydypsia or polyphagia, No significant Mental Stressors.  A full 10 point Review of Systems was done, except as stated above, all other Review of Systems were negative.   Social History Social History   Tobacco Use  . Smoking status: Never Smoker  . Smokeless tobacco: Never Used  Substance Use Topics  . Alcohol use: No     Family History Family History  Problem Relation Age of Onset  . Stroke Mother   . Stroke Father   . Asthma Sister      Prior to Admission medications   Medication Sig Start Date End Date Taking? Authorizing Provider  acetaminophen (TYLENOL) 325 MG tablet Take 650 mg by mouth every 6 (six) hours as needed for pain.   Yes [provider]  amLODipine (NORVASC) 5 MG tablet Take 5 mg by mouth daily.  04/13/16  Yes [provider]  aspirin EC 325 MG tablet Take 1 tablet (325 mg total) by mouth 2 (two) times daily after a meal. Take x 1 month post op  to decrease risk of blood clots. 06/22/17  Yes Gary Fleet, PA-C  cetirizine (ZYRTEC) 10 MG tablet Take 10 mg by mouth daily.   Yes [provider]  Cholecalciferol (VITAMIN D3) 5000 units TABS Take 1 tablet by mouth daily.    Yes [provider]  escitalopram (LEXAPRO) 10 MG tablet Take 10 mg by mouth daily.   Yes [provider]  ferrous sulfate 325 (65 FE) MG tablet Take 325 mg by mouth daily with breakfast.   Yes [provider]  fluticasone (FLONASE) 50 MCG/ACT nasal spray Place 1 spray into the nose daily as needed for allergies.  06/22/12  Yes [provider]  HYDROcodone-acetaminophen (NORCO) 5-325 MG tablet Take 1-2 tablets by mouth every 6 (six) hours as needed for moderate pain. 06/22/17  Yes Gary Fleet, PA-C  Lactobacillus (PROBIOTIC ACIDOPHILUS PO) Take 1 capsule by mouth daily.   Yes [provider]  loperamide (IMODIUM) 2 MG capsule Take 2 mg by mouth as needed for diarrhea or loose stools.   Yes [provider]  Menthol, Topical Analgesic, (BENGAY ULTRA STRENGTH EX) Apply 1 application topically as needed (hip pain).   Yes [provider]  omeprazole (PRILOSEC) 20 MG capsule Take 20 mg by mouth daily.  06/25/12  Yes [provider]  VITAMIN B1-B12 IJ Inject as directed every 30 (thirty) days.   Yes [provider]  furosemide (LASIX) 20 MG tablet Take 20 mg by mouth daily. 06/29/17   [provider]  prochlorperazine (COMPAZINE) 10 MG tablet Take 1 tablet (10 mg total) by mouth every 6 (six) hours as needed for nausea or vomiting. Patient not taking: Reported on 07/07/2016 05/03/16   Ladell Pier, MD  telmisartan (MICARDIS) 20 MG tablet Take 1 tablet (20 mg total) by mouth daily. Patient not taking: Reported on 06/30/2017 06/22/17   Gary Fleet, PA-C    No Known Allergies  Physical Exam  Vitals  Blood pressure (!) 170/72, pulse 87, temperature (!) 97.5 F (36.4 C),  temperature source Oral, resp. rate (!) 23, height 5\' 2"  (1.575 m), weight 74.8 kg (165 lb), SpO2 95 %.   1. General well-developed, well-nourished female in no acute distress, very pleasant  2. Normal affect and insight, Not Suicidal or Homicidal, Awake Alert, Oriented X 3.  3. No F.N deficits, grossly, patient moving  all extremities.  4. Ears and Eyes appear Normal, Conjunctivae clear, PERRLA. Moist Oral Mucosa.  5. Supple Neck, No JVD, No cervical lymphadenopathy appriciated, No Carotid Bruits.  6. Symmetrical Chest wall movement, Good air movement bilaterally, CTAB.  7. RRR, No Gallops, Rubs or Murmurs, No Parasternal Heave.  8. Positive Bowel Sounds, Abdomen Soft, Non tender, No organomegaly appriciated  9.  No Cyanosis, Normal Skin Turgor, No Skin Rash or Bruise.  10. Good muscle tone,  joints appear normal , no effusions, Normal ROM.    Data Review  CBC Recent Labs  Lab 06/30/17 1741  WBC 9.3  HGB 9.3*  HCT 29.7*  PLT 458*  MCV 97.4  MCH 30.5  MCHC 31.3  RDW 15.3  LYMPHSABS 1.5  MONOABS 0.5  EOSABS 0.2  BASOSABS 0.0   ------------------------------------------------------------------------------------------------------------------  Chemistries  Recent Labs  Lab 06/30/17 1741  NA 142  K 3.4*  CL 109  CO2 20*  GLUCOSE 98  BUN 18  CREATININE 1.41*  CALCIUM 9.1  AST 38  ALT 28  ALKPHOS 99  BILITOT 0.6   ------------------------------------------------------------------------------------------------------------------ estimated creatinine clearance is 31.6 mL/min (A) (by C-G formula based on SCr of 1.41 mg/dL (H)). ------------------------------------------------------------------------------------------------------------------ No results for input(s): TSH, T4TOTAL, T3FREE, THYROIDAB in the last 72 hours.  Invalid input(s): FREET3   Coagulation profile No results for input(s): INR, PROTIME in the last 168  hours. ------------------------------------------------------------------------------------------------------------------- No results for input(s): DDIMER in the last 72 hours. -------------------------------------------------------------------------------------------------------------------  Cardiac Enzymes No results for input(s): CKMB, TROPONINI, MYOGLOBIN in the last 168 hours.  Invalid input(s): CK ------------------------------------------------------------------------------------------------------------------ Invalid input(s): POCBNP   ---------------------------------------------------------------------------------------------------------------  Urinalysis    Component Value Date/Time   COLORURINE YELLOW 10/10/2012 1541   APPEARANCEUR CLOUDY (A) 10/10/2012 1541   LABSPEC 1.008 10/10/2012 1541   PHURINE 6.0 10/10/2012 1541   GLUCOSEU NEGATIVE 10/10/2012 1541   HGBUR MODERATE (A) 10/10/2012 1541   BILIRUBINUR NEGATIVE 10/10/2012 1541   KETONESUR NEGATIVE 10/10/2012 1541   PROTEINUR NEGATIVE 10/10/2012 1541   UROBILINOGEN 0.2 10/10/2012 1541   NITRITE POSITIVE (A) 10/10/2012 1541   LEUKOCYTESUR LARGE (A) 10/10/2012 1541    ----------------------------------------------------------------------------------------------------------------   Imaging results:   Dg Chest 2 View  Result Date: 06/30/2017 CLINICAL DATA:  Pt in c/o SOB on exertion onset yesterday, pt discharged last wk for fractured L femur, pt denies CP EXAM: CHEST  2 VIEW COMPARISON:  06/21/2017 FINDINGS: The heart is mildly enlarged. There are no focal consolidations or pleural effusions. No pulmonary edema. IMPRESSION: Stable, mild cardiomegaly. No evidence for acute pulmonary abnormality. Electronically Signed   By: Nolon Nations M.D.   On: 06/30/2017 18:21   Ct Angio Chest Pe W/cm &/or Wo Cm  Result Date: 06/30/2017 CLINICAL DATA:  C/o SOB onset yesterday, pt discharged last wk for fractured L femur,sp  surgery EXAM: CT ANGIOGRAPHY CHEST WITH CONTRAST TECHNIQUE: Multidetector CT imaging of the chest was performed using the standard protocol during bolus administration of intravenous contrast. Multiplanar CT image reconstructions and MIPs were obtained to evaluate the vascular anatomy. CONTRAST:  124mL ISOVUE-370 IOPAMIDOL (ISOVUE-370) INJECTION 76% COMPARISON:  Chest x-ray 06/30/2017 and chest CT 03/08/2016 FINDINGS: Cardiovascular: There are numerous pulmonary emboli involving upper and lower lobe pulmonary arteries bilaterally. RV: LV ratio is 1.2, compatible with right heart strain. Heart size is normal. No significant pericardial effusion. There are coronary artery calcifications. There is atherosclerotic calcification of the thoracic aorta not associated with aneurysm. Mediastinum/Nodes: The visualized portion of the thyroid gland has a normal appearance. No  mediastinal, hilar, or axillary adenopathy. There is a hiatal hernia. Lungs/Pleura: There are patchy pleural based lung opacities at the apices, right greater than left and stable compared with prior CT exam. No pleural effusions. No suspicious pulmonary nodules. There are dependent changes in the lungs. Upper Abdomen: No acute abnormality. Musculoskeletal: No chest wall abnormality. No acute or significant osseous findings. Review of the MIP images confirms the above findings. IMPRESSION: 1. Positive for acute PE with CTevidence of right heart strain (RV/LV Ratio = ) consistent with at least submassive (intermediate risk) PE. The presence of right heart strain has been associated with an increased risk of morbidity and mortality. 2. Coronary artery disease. 3.  Aortic atherosclerosis.  (ICD10-I70.0) Critical Value/emergent results were called by telephone at the time of interpretation on 06/30/2017 at 8:04 pm to Dr. Threasa Beards BELFI , who verbally acknowledged these results. Electronically Signed   By: Nolon Nations M.D.   On: 06/30/2017 20:10   Dg Chest  Portable 1 View  Result Date: 06/21/2017 CLINICAL DATA:  Post fall.  Preoperative examination. EXAM: PORTABLE CHEST 1 VIEW COMPARISON:  01/29/2016; chest CT - 03/08/2016 FINDINGS: Grossly unchanged cardiac silhouette and mediastinal contours. Mild diffuse slightly nodular thickening of the pulmonary interstitium, unchanged. No discrete focal airspace opacities. No pleural effusion or pneumothorax. Left apical pleural calcifications are unchanged. No acute osseus abnormalities. Post cholecystectomy. IMPRESSION: No acute cardiopulmonary disease. Electronically Signed   By: Sandi Mariscal M.D.   On: 06/21/2017 20:42   Dg Knee Left Port  Result Date: 06/21/2017 CLINICAL DATA:  Post fall.  History of left knee replacement. EXAM: PORTABLE LEFT KNEE - 1-2 VIEW COMPARISON:  None. FINDINGS: No definite fracture or dislocation. Post left total knee replacement without evidence of hardware failure loosening. No knee joint effusion. Regional soft tissues appear normal. IMPRESSION: 1. No fracture or dislocation. 2. Post left total knee replacement without evidence of hardware failure or loosening. Electronically Signed   By: Sandi Mariscal M.D.   On: 06/21/2017 20:39   Dg C-arm 1-60 Min  Result Date: 06/22/2017 CLINICAL DATA:  Femur fracture EXAM: LEFT FEMUR 2 VIEWS; DG C-ARM 61-120 MIN COMPARISON:  06/21/2017 FINDINGS: Eighteen low resolution intraoperative spot views of the left femur. Total fluoroscopy time was 4 minutes 24 seconds. Initial image demonstrates an angulated subtrochanteric fracture. Subsequent images demonstrate intramedullary rod fixation of the left femur. A left knee replacement is noted. IMPRESSION: Intraoperative fluoroscopic assistance provided during surgical fixation of left subtrochanteric fracture Electronically Signed   By: Donavan Foil M.D.   On: 06/22/2017 01:24   Dg Hip Port Unilat W Or Wo Pelvis 1 View Left  Result Date: 06/21/2017 CLINICAL DATA:  LEFT hip pain after fall at church.  History of bladder cancer, breast cancer. EXAM: DG HIP (WITH OR WITHOUT PELVIS) 1V PORT LEFT COMPARISON:  None. FINDINGS: Acute oblique fracture through proximal LEFT femur with varus angulation distal bony fragment, anteriorly directed fracture apex. Thickened lateral cortex with softening of the fracture line. Focally thickened contralateral femur cortex without fracture line. Femoral heads are located. No dislocation. Soft tissue planes are not suspicious. IMPRESSION: Acute displaced proximal LEFT femur fracture, potentially related to bisphosphonate use. Recommend correlation with medication use. Focal cortical thickening contralateral femur, possibly related to bisphosphonate use. Electronically Signed   By: Elon Alas M.D.   On: 06/21/2017 20:44   Dg Femur Min 2 Views Left  Result Date: 06/22/2017 CLINICAL DATA:  Femur fracture EXAM: LEFT FEMUR 2 VIEWS; DG C-ARM 61-120 MIN  COMPARISON:  06/21/2017 FINDINGS: Eighteen low resolution intraoperative spot views of the left femur. Total fluoroscopy time was 4 minutes 24 seconds. Initial image demonstrates an angulated subtrochanteric fracture. Subsequent images demonstrate intramedullary rod fixation of the left femur. A left knee replacement is noted. IMPRESSION: Intraoperative fluoroscopic assistance provided during surgical fixation of left subtrochanteric fracture Electronically Signed   By: Donavan Foil M.D.   On: 06/22/2017 01:24    My personal review of EKG: Rhythm NSR, 92 bpm with ST depressions in inferior leads probably due to right ventricular strain  Assessment & Plan   1. Pulmonary embolism with right heart strain. 2. Status post intramedullary nail in left femur fracture on 06/20/2017 3. History of breast and bladder cancer with absent bladder 4. History of arthritis 5. Urinary tract infection  Plan  Admit to step down Heparin IV IV antibiotics Check echocardiogram Consult PCM  DVT Prophylaxis Heparin  AM Labs  Ordered, also please review Full Orders    Code Status full  Disposition Plan: Home  Time spent in minutes : 43 minutes  Condition GUARDED   @SIGNATURE @

## 2017-06-30 NOTE — Consult Note (Signed)
Name: Caitlin [name] MRN: [id number] DOB: 1941/03/30    ADMISSION DATE:  06/30/2017 CONSULTATION DATE:  06/30/2017  REFERRING MD :  Dr. Tamera Punt   CHIEF COMPLAINT:  PE   HISTORY OF PRESENT ILLNESS:   77 year old female with PMH of Breast Cancer, Bladder Cancer s/p bladder removal (2009) s/p chemotherapy/Radiation completed January 2018, HTN, CKD, Mitral Valve Prolapse, Self Catheterizes. Recent Admission 1/23-1/25 s/p Fall with Left Femur fracture underwent intramedullary nail placement.   Presented to ED on 2/1 with shortness of breath. CTA Chest with numerous bilateral PE with RV/LV 1.2. PCCM asked to consult   Patient states that she took a prolonged trip to [place] in September. Has noticed swelling to her lower extremities, however had was negative for DVT in October. Upon assessment patient stable, in no distress, able to speak multiple sentences at a time.    SIGNIFICANT EVENTS  2/1 > Presented to ED   STUDIES:  CTA Chest 2/1 > 1. Positive for acute PE with CTevidence of right heart strain (RV/LV Ratio = ) consistent with at least submassive (intermediate risk) PE. The presence of right heart strain has been associated with an increased risk of morbidity and mortality. 2. Coronary artery disease. CXR 2/1 > Stable, mild cardiomegaly. No evidence for acute pulmonary abnormality   PAST MEDICAL HISTORY :   has a past medical history of Arthritis, Bladder absent (Pt had bladder removed in 2010), Bladder cancer ([place]), Breast cancer ([place]), Chronic kidney disease, Complication of anesthesia, GERD (gastroesophageal reflux disease), History of radiation therapy (05/02/16-06/07/16), Hypertension, Mitral valve prolapse, Personal history of radiation therapy (2005), PONV (postoperative nausea and vomiting), and Self-catheterizes urinary bladder.  has a past surgical history that includes Replacement total knee (Left); Abdominal hysterectomy (1980); Breast surgery (2005); Bladder surgery (2009);  Cholecystectomy (N/A, 02/19/2013); Anterior and posterior repair (N/A, 01/25/2016); ir generic historical (05/03/2016); ir generic historical (05/03/2016); ir generic historical (05/31/2016); ir generic historical (05/31/2016); Breast lumpectomy (Left, 2005); Breast biopsy (Left, 2005); and Femur IM nail (Left, 06/21/2017). Prior to Admission medications   Medication Sig Start Date End Date Taking? Authorizing Provider  acetaminophen (TYLENOL) 325 MG tablet Take 650 mg by mouth every 6 (six) hours as needed for pain.   Yes [provider]  amLODipine (NORVASC) 5 MG tablet Take 5 mg by mouth daily.  04/13/16  Yes [provider]  aspirin EC 325 MG tablet Take 1 tablet (325 mg total) by mouth 2 (two) times daily after a meal. Take x 1 month post op to decrease risk of blood clots. 06/22/17  Yes Gary Fleet, PA-C  cetirizine (ZYRTEC) 10 MG tablet Take 10 mg by mouth daily.   Yes [provider]  Cholecalciferol (VITAMIN D3) 5000 units TABS Take 1 tablet by mouth daily.    Yes [provider]  escitalopram (LEXAPRO) 10 MG tablet Take 10 mg by mouth daily.   Yes [provider]  ferrous sulfate 325 (65 FE) MG tablet Take 325 mg by mouth daily with breakfast.   Yes [provider]  fluticasone (FLONASE) 50 MCG/ACT nasal spray Place 1 spray into the nose daily as needed for allergies.  06/22/12  Yes [provider]  HYDROcodone-acetaminophen (NORCO) 5-325 MG tablet Take 1-2 tablets by mouth every 6 (six) hours as needed for moderate pain. 06/22/17  Yes Gary Fleet, PA-C  Lactobacillus (PROBIOTIC ACIDOPHILUS PO) Take 1 capsule by mouth daily.   Yes [provider]  loperamide (IMODIUM) 2 MG capsule Take 2  mg by mouth as needed for diarrhea or loose stools.   Yes [provider]  Menthol, Topical Analgesic, (BENGAY ULTRA STRENGTH EX) Apply 1 application topically as needed (hip pain).   Yes [provider]  omeprazole  (PRILOSEC) 20 MG capsule Take 20 mg by mouth daily.  06/25/12  Yes [provider]  VITAMIN B1-B12 IJ Inject as directed every 30 (thirty) days.   Yes [provider]  furosemide (LASIX) 20 MG tablet Take 20 mg by mouth daily. 06/29/17   [provider]  prochlorperazine (COMPAZINE) 10 MG tablet Take 1 tablet (10 mg total) by mouth every 6 (six) hours as needed for nausea or vomiting. Patient not taking: Reported on 07/07/2016 05/03/16   Ladell Pier, MD  telmisartan (MICARDIS) 20 MG tablet Take 1 tablet (20 mg total) by mouth daily. Patient not taking: Reported on 06/30/2017 06/22/17   Gary Fleet, PA-C   No Known Allergies  FAMILY HISTORY:  family history includes Asthma in her sister; Stroke in her father and mother. SOCIAL HISTORY:  reports that  has never smoked. she has never used smokeless tobacco. She reports that she does not drink alcohol or use drugs.  REVIEW OF SYSTEMS:   All negative; except for those that are bolded, which indicate positives.  Constitutional: weight loss, weight gain, night sweats, fevers, chills, fatigue, weakness.  HEENT: headaches, sore throat, sneezing, nasal congestion, post nasal drip, difficulty swallowing, tooth/dental problems, visual complaints, visual changes, ear aches. Neuro: difficulty with speech, weakness, numbness, ataxia. CV:  chest pain, orthopnea, PND, swelling in lower extremities, dizziness, palpitations, syncope.  Resp: cough, hemoptysis, dyspnea, wheezing. GI: heartburn, indigestion, abdominal pain, nausea, vomiting, diarrhea, constipation, change in bowel habits, loss of appetite, hematemesis, melena, hematochezia.  GU: dysuria, change in color of urine, urgency or frequency, flank pain, hematuria. MSK: joint pain or swelling, decreased range of motion. Psych: change in mood or affect, depression, anxiety, suicidal ideations, homicidal ideations. Skin: rash, itching, bruising.  SUBJECTIVE:   VITAL  SIGNS: Temp:  [97.5 F (36.4 C)] 97.5 F (36.4 C) (02/01 1743) Pulse Rate:  [83-92] 87 (02/01 2045) Resp:  [17-26] 23 (02/01 2045) BP: (167-180)/(63-91) 170/72 (02/01 2045) SpO2:  [94 %-96 %] 95 % (02/01 2045) Weight:  [74.8 kg (165 lb)] 74.8 kg (165 lb) (02/01 1744)  PHYSICAL EXAMINATION: General:  Elderly female, no distress  Neuro:  Alert, oriented, follows commands  HEENT:  Dry MM  Cardiovascular:  RRR, Mitral Valve Murmur  Lungs:  Clear breath sounds, no wheeze/crackles  Abdomen:  Non-distended, active bowel sounds  Musculoskeletal:  +2 edema BLE  Skin:  Warm, dry, intact   Recent Labs  Lab 06/30/17 1741  NA 142  K 3.4*  CL 109  CO2 20*  BUN 18  CREATININE 1.41*  GLUCOSE 98   Recent Labs  Lab 06/30/17 1741  HGB 9.3*  HCT 29.7*  WBC 9.3  PLT 458*   Dg Chest 2 View  Result Date: 06/30/2017 CLINICAL DATA:  Pt in c/o SOB on exertion onset yesterday, pt discharged last wk for fractured L femur, pt denies CP EXAM: CHEST  2 VIEW COMPARISON:  06/21/2017 FINDINGS: The heart is mildly enlarged. There are no focal consolidations or pleural effusions. No pulmonary edema. IMPRESSION: Stable, mild cardiomegaly. No evidence for acute pulmonary abnormality. Electronically Signed   By: Nolon Nations M.D.   On: 06/30/2017 18:21   Ct Angio Chest Pe W/cm &/or Wo Cm  Result Date: 06/30/2017 CLINICAL DATA:  C/o  SOB onset yesterday, pt discharged last wk for fractured L femur,sp surgery EXAM: CT ANGIOGRAPHY CHEST WITH CONTRAST TECHNIQUE: Multidetector CT imaging of the chest was performed using the standard protocol during bolus administration of intravenous contrast. Multiplanar CT image reconstructions and MIPs were obtained to evaluate the vascular anatomy. CONTRAST:  126mL ISOVUE-370 IOPAMIDOL (ISOVUE-370) INJECTION 76% COMPARISON:  Chest x-ray 06/30/2017 and chest CT 03/08/2016 FINDINGS: Cardiovascular: There are numerous pulmonary emboli involving upper and lower lobe pulmonary  arteries bilaterally. RV: LV ratio is 1.2, compatible with right heart strain. Heart size is normal. No significant pericardial effusion. There are coronary artery calcifications. There is atherosclerotic calcification of the thoracic aorta not associated with aneurysm. Mediastinum/Nodes: The visualized portion of the thyroid gland has a normal appearance. No mediastinal, hilar, or axillary adenopathy. There is a hiatal hernia. Lungs/Pleura: There are patchy pleural based lung opacities at the apices, right greater than left and stable compared with prior CT exam. No pleural effusions. No suspicious pulmonary nodules. There are dependent changes in the lungs. Upper Abdomen: No acute abnormality. Musculoskeletal: No chest wall abnormality. No acute or significant osseous findings. Review of the MIP images confirms the above findings. IMPRESSION: 1. Positive for acute PE with CTevidence of right heart strain (RV/LV Ratio = ) consistent with at least submassive (intermediate risk) PE. The presence of right heart strain has been associated with an increased risk of morbidity and mortality. 2. Coronary artery disease. 3.  Aortic atherosclerosis.  (ICD10-I70.0) Critical Value/emergent results were called by telephone at the time of interpretation on 06/30/2017 at 8:04 pm to Dr. Threasa Beards BELFI , who verbally acknowledged these results. Electronically Signed   By: Nolon Nations M.D.   On: 06/30/2017 20:10    ASSESSMENT / PLAN:  Bilateral Pulmonary Embolism in upper and lower lobe with right heart strain (RV/LV 1.2)  H/O Bladder/Breast CA, Recent Surgery  Plan  -Cardiac Monitoring  -Maintain Oxygenation >92 (Currently on room air)  -Troponin  -ECHO -Lower Extremity Dopplers  -Continue Heparin gtt  -R/O Mets in setting of Previous Breast and Bladder CA   PCCM will sign off at this time, if needed back please re-consult.   Hayden Pedro, AGACNP-BC [facility]  Pgr: [phone]   PCCM Pgr: [phone]

## 2017-06-30 NOTE — ED Notes (Addendum)
Attempted report x 2. Awaiting call back from RN.

## 2017-06-30 NOTE — ED Provider Notes (Signed)
[facility] EMERGENCY DEPARTMENT Provider Note   CSN: [id number] Arrival date & time: 06/30/17  1729     History   Chief Complaint No chief complaint on file.   HPI Caitlin [name] is a 77 y.o. female.  Patient is a 77 year old female who presents with shortness of breath.  She had a recent hip surgery to replace a subtrochanteric fracture of her left hip.  She was discharged on January 25.  She states since yesterday she has had some increasing shortness of breath.  She states she can only walk for short distances without getting short of breath.  She has a little bit of a cough but it is nonproductive.  She has some chronic sinus drainage which is unchanged from baseline.  She denies any known fevers.  No nausea or vomiting.  No associated chest pain.  She does have some leg swelling which is little bit increased from her baseline.  She states normally her swelling goes down at night but over the last several days it has not been going down at night.  She had physical therapy at home today and was very short of breath while she was doing that.      Past Medical History:  Diagnosis Date  . Arthritis   . 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 Bladder cancer ([place])   . Breast cancer ([place])   . Chronic kidney disease    does not empty bladder,self cath  . Complication of anesthesia   . GERD (gastroesophageal reflux disease)   . History of radiation therapy 05/02/16-06/07/16   pelvis 55 Gy in 25 fractions  . Hypertension   . Mitral valve prolapse   . Personal history of radiation therapy 2005  . PONV (postoperative nausea and vomiting)   . Self-catheterizes urinary bladder     Patient Active Problem List   Diagnosis Date Noted  . Postoperative anemia due to acute blood loss 06/23/2017  . Closed left subtrochanteric femur fracture ([place]) 06/22/2017  . Femur fracture ([place]) 06/21/2017  . 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[place]) 04/01/2016  . Hyperkalemia 01/27/2016  . Acute  blood loss anemia 01/27/2016  . Acute renal insufficiency 01/26/2016  . Urethral caruncle 01/26/2016  . Urinary tract infection 01/25/2016  . PAC (premature atrial contraction) 11/29/2012  . DOE (dyspnea on exertion) 11/29/2012  . Orthostatic hypotension 10/10/2012  . Dizziness 10/10/2012  . UTI (lower urinary tract infection) 10/10/2012  . HTN (hypertension) 10/10/2012    Past Surgical History:  Procedure Laterality Date  . ABDOMINAL HYSTERECTOMY  1980   total  . ANTERIOR AND POSTERIOR REPAIR N/A 01/25/2016   Procedure: Excision of suburethral mass;  Surgeon: Irine Seal, MD;  Location: [facility];  Service: Urology;  Laterality: N/A;  . BLADDER SURGERY  2009   due to cancer  . BREAST BIOPSY Left 2005  . BREAST LUMPECTOMY Left 2005  . BREAST SURGERY  2005   Left Breast Lumpectomy  . CHOLECYSTECTOMY N/A 02/19/2013   Procedure: LAPAROSCOPIC CHOLECYSTECTOMY WITH INTRAOPERATIVE CHOLANGIOGRAM;  Surgeon: Ralene Ok, MD;  Location: [facility];  Service: General;  Laterality: N/A;  . FEMUR IM NAIL Left 06/21/2017   Procedure: INTRAMEDULLARY (IM) NAIL FEMORAL;  Surgeon: Dorna Leitz, MD;  Location: [facility];  Service: Orthopedics;  Laterality: Left;  . IR GENERIC HISTORICAL  05/03/2016   IR FLUORO GUIDE CV LINE RIGHT 05/03/2016 Corrie Mckusick, DO [facility]  . IR GENERIC HISTORICAL  05/03/2016   IR US GUIDE VASC ACCESS RIGHT  05/03/2016 Corrie Mckusick, DO [facility]  . IR GENERIC HISTORICAL  05/31/2016   IR FLUORO GUIDE CV LINE LEFT 05/31/2016 Sandi Mariscal, MD [facility]  . IR GENERIC HISTORICAL  05/31/2016   IR US GUIDE VASC ACCESS LEFT 05/31/2016 Sandi Mariscal, MD [facility]  . REPLACEMENT TOTAL KNEE Left     OB History    No data available       Home Medications    Prior to Admission medications   Medication Sig Start Date End Date Taking? Authorizing Provider  acetaminophen (TYLENOL) 325 MG tablet Take 650 mg by mouth every 6 (six) hours as needed for pain.   Yes [provider]    amLODipine (NORVASC) 5 MG tablet Take 5 mg by mouth daily.  04/13/16  Yes [provider]  aspirin EC 325 MG tablet Take 1 tablet (325 mg total) by mouth 2 (two) times daily after a meal. Take x 1 month post op to decrease risk of blood clots. 06/22/17  Yes Gary Fleet, PA-C  cetirizine (ZYRTEC) 10 MG tablet Take 10 mg by mouth daily.   Yes [provider]  Cholecalciferol (VITAMIN D3) 5000 units TABS Take 1 tablet by mouth daily.    Yes [provider]  escitalopram (LEXAPRO) 10 MG tablet Take 10 mg by mouth daily.   Yes [provider]  ferrous sulfate 325 (65 FE) MG tablet Take 325 mg by mouth daily with breakfast.   Yes [provider]  fluticasone (FLONASE) 50 MCG/ACT nasal spray Place 1 spray into the nose daily as needed for allergies.  06/22/12  Yes [provider]  HYDROcodone-acetaminophen (NORCO) 5-325 MG tablet Take 1-2 tablets by mouth every 6 (six) hours as needed for moderate pain. 06/22/17  Yes Gary Fleet, PA-C  Lactobacillus (PROBIOTIC ACIDOPHILUS PO) Take 1 capsule by mouth daily.   Yes [provider]  loperamide (IMODIUM) 2 MG capsule Take 2 mg by mouth as needed for diarrhea or loose stools.   Yes [provider]  Menthol, Topical Analgesic, (BENGAY ULTRA STRENGTH EX) Apply 1 application topically as needed (hip pain).   Yes [provider]  omeprazole (PRILOSEC) 20 MG capsule Take 20 mg by mouth daily.  06/25/12  Yes [provider]  VITAMIN B1-B12 IJ Inject as directed every 30 (thirty) days.   Yes [provider]  furosemide (LASIX) 20 MG tablet Take 20 mg by mouth daily. 06/29/17   [provider]  prochlorperazine (COMPAZINE) 10 MG tablet Take 1 tablet (10 mg total) by mouth every 6 (six) hours as needed for nausea or vomiting. Patient not taking: Reported on 07/07/2016 05/03/16   Ladell Pier, MD  telmisartan (MICARDIS) 20 MG tablet Take 1 tablet (20 mg total) by  mouth daily. Patient not taking: Reported on 06/30/2017 06/22/17   Gary Fleet, PA-C    Family History Family History  Problem Relation Age of Onset  . Stroke Mother   . Stroke Father   . Asthma Sister     Social History Social History   Tobacco Use  . Smoking status: Never Smoker  . Smokeless tobacco: Never Used  Substance Use Topics  . Alcohol use: No  . Drug use: No     Allergies   Patient has no known allergies.   Review of Systems Review of Systems  Constitutional: Negative for chills, diaphoresis, fatigue and fever.  HENT: Negative for congestion, rhinorrhea and sneezing.   Eyes: Negative.   Respiratory: Positive for shortness  of breath. Negative for cough and chest tightness.   Cardiovascular: Positive for leg swelling. Negative for chest pain.  Gastrointestinal: Negative for abdominal pain, blood in stool, diarrhea, nausea and vomiting.  Genitourinary: Negative for difficulty urinating, flank pain, frequency and hematuria.  Musculoskeletal: Negative for arthralgias and back pain.  Skin: Negative for rash.  Neurological: Negative for dizziness, speech difficulty, weakness, numbness and headaches.     Physical Exam Updated Vital Signs BP (!) 170/72   Pulse 87   Temp (!) 97.5 F (36.4 C) (Oral)   Resp (!) 23   Ht 5\' 2"  (1.575 m)   Wt 74.8 kg (165 lb)   SpO2 95%   BMI 30.18 kg/m   Physical Exam  Constitutional: She is oriented to person, place, and time. She appears well-developed and well-nourished.  HENT:  Head: Normocephalic and atraumatic.  Eyes: Pupils are equal, round, and reactive to light.  Neck: Normal range of motion. Neck supple.  Cardiovascular: Normal rate, regular rhythm and normal heart sounds.  Pulmonary/Chest: Effort normal and breath sounds normal. No respiratory distress. She has no wheezes. She has no rales. She exhibits no tenderness.  Abdominal: Soft. Bowel sounds are normal. There is no tenderness. There is no rebound and no  guarding.  Musculoskeletal: Normal range of motion. She exhibits edema (1+ pitting edema bilaterally).  Lymphadenopathy:    She has no cervical adenopathy.  Neurological: She is alert and oriented to person, place, and time.  Skin: Skin is warm and dry. No rash noted.  Psychiatric: She has a normal mood and affect.     ED Treatments / Results  Labs (all labs ordered are listed, but only abnormal results are displayed) Labs Reviewed  CBC WITH DIFFERENTIAL/PLATELET - Abnormal; Notable for the following components:      Result Value   RBC 3.05 (*)    Hemoglobin 9.3 (*)    HCT 29.7 (*)    Platelets 458 (*)    All other components within normal limits  COMPREHENSIVE METABOLIC PANEL - Abnormal; Notable for the following components:   Potassium 3.4 (*)    CO2 20 (*)    Creatinine, Ser 1.41 (*)    Albumin 3.1 (*)    GFR calc non Af Amer 35 (*)    GFR calc Af Amer 40 (*)    All other components within normal limits  BRAIN NATRIURETIC PEPTIDE - Abnormal; Notable for the following components:   B Natriuretic Peptide 251.6 (*)    All other components within normal limits  HEPARIN LEVEL (UNFRACTIONATED)  CBC  MAGNESIUM  I-STAT TROPONIN, ED    EKG  EKG Interpretation  Date/Time:  Friday June 30 2017 17:35:08 EST Ventricular Rate:  92 PR Interval:  150 QRS Duration: 72 QT Interval:  388 QTC Calculation: 479 R Axis:   84 Text Interpretation:  Normal sinus rhythm ST & T wave abnormality, consider inferior ischemia Abnormal ECG since last tracing no significant change Confirmed by Malvin Johns [phone]) on 06/30/2017 7:18:59 PM       Radiology Dg Chest 2 View  Result Date: 06/30/2017 CLINICAL DATA:  Pt in c/o SOB on exertion onset yesterday, pt discharged last wk for fractured L femur, pt denies CP EXAM: CHEST  2 VIEW COMPARISON:  06/21/2017 FINDINGS: The heart is mildly enlarged. There are no focal consolidations or pleural effusions. No pulmonary edema. IMPRESSION: Stable,  mild cardiomegaly. No evidence for acute pulmonary abnormality. Electronically Signed   By: Nolon Nations M.D.   On:  06/30/2017 18:21   Ct Angio Chest Pe W/cm &/or Wo Cm  Result Date: 06/30/2017 CLINICAL DATA:  C/o SOB onset yesterday, pt discharged last wk for fractured L femur,sp surgery EXAM: CT ANGIOGRAPHY CHEST WITH CONTRAST TECHNIQUE: Multidetector CT imaging of the chest was performed using the standard protocol during bolus administration of intravenous contrast. Multiplanar CT image reconstructions and MIPs were obtained to evaluate the vascular anatomy. CONTRAST:  12mL ISOVUE-370 IOPAMIDOL (ISOVUE-370) INJECTION 76% COMPARISON:  Chest x-ray 06/30/2017 and chest CT 03/08/2016 FINDINGS: Cardiovascular: There are numerous pulmonary emboli involving upper and lower lobe pulmonary arteries bilaterally. RV: LV ratio is 1.2, compatible with right heart strain. Heart size is normal. No significant pericardial effusion. There are coronary artery calcifications. There is atherosclerotic calcification of the thoracic aorta not associated with aneurysm. Mediastinum/Nodes: The visualized portion of the thyroid gland has a normal appearance. No mediastinal, hilar, or axillary adenopathy. There is a hiatal hernia. Lungs/Pleura: There are patchy pleural based lung opacities at the apices, right greater than left and stable compared with prior CT exam. No pleural effusions. No suspicious pulmonary nodules. There are dependent changes in the lungs. Upper Abdomen: No acute abnormality. Musculoskeletal: No chest wall abnormality. No acute or significant osseous findings. Review of the MIP images confirms the above findings. IMPRESSION: 1. Positive for acute PE with CTevidence of right heart strain (RV/LV Ratio = ) consistent with at least submassive (intermediate risk) PE. The presence of right heart strain has been associated with an increased risk of morbidity and mortality. 2. Coronary artery disease. 3.  Aortic  atherosclerosis.  (ICD10-I70.0) Critical Value/emergent results were called by telephone at the time of interpretation on 06/30/2017 at 8:04 pm to Dr. Threasa Beards Karen Huhta , who verbally acknowledged these results. Electronically Signed   By: Nolon Nations M.D.   On: 06/30/2017 20:10    Procedures Procedures (including critical care time)  Medications Ordered in ED Medications  heparin ADULT infusion 100 units/mL (25000 units/269mL sodium chloride 0.45%) (1,100 Units/hr Intravenous New Bag/Given 06/30/17 2047)  potassium chloride SA (K-DUR,KLOR-CON) CR tablet 40 mEq (not administered)  iopamidol (ISOVUE-370) 76 % injection (100 mLs  Contrast Given 06/30/17 1937)  heparin bolus via infusion 4,000 Units (4,000 Units Intravenous Bolus from Bag 06/30/17 2050)     Initial Impression / Assessment and Plan / ED Course  I have reviewed the triage vital signs and the nursing notes.  Pertinent labs & imaging results that were available during my care of the patient were reviewed by me and considered in my medical decision making (see chart for details).     CT scan shows bilateral pulmonary emboli with right heart strain.  She was started on heparin.  I consulted with PCCM who will consult on the patient.  I spoke with Dr. Laren Everts who will admit the pt. her hemoglobin is low but has improved since his surgery.  Her creatinine is elevated but similar to baseline values.  CRITICAL CARE Performed by: Malvin Johns Total critical care time: 40 minutes Critical care time was exclusive of separately billable procedures and treating other patients. Critical care was necessary to treat or prevent imminent or life-threatening deterioration. Critical care was time spent personally by me on the following activities: development of treatment plan with patient and/or surrogate as well as nursing, discussions with consultants, evaluation of patient's response to treatment, examination of patient, obtaining history from  patient or surrogate, ordering and performing treatments and interventions, ordering and review of laboratory studies, ordering and review of  radiographic studies, pulse oximetry and re-evaluation of patient's condition.   Final Clinical Impressions(s) / ED Diagnoses   Final diagnoses:  Other acute pulmonary embolism with acute cor pulmonale [facility])    ED Discharge Orders    None       Malvin Johns, MD 06/30/17 2111

## 2017-06-30 NOTE — ED Notes (Signed)
Attempted report x1. 

## 2017-06-30 NOTE — ED Notes (Signed)
Patient transported to CT 

## 2017-06-30 NOTE — ED Notes (Signed)
Pt reports due to hx of bladder CA she straight caths at home. Pt reports she feels like she will have to go soon but would like to wait a little longer to try and void. This RN will speak with EDP about order for in and out catheter.

## 2017-06-30 NOTE — ED Triage Notes (Signed)
Pt in c/o SOB onset yesterday, pt discharged last wk for fractured L femur, pt denies CP, denies n/v/d, pt A&O x4

## 2017-06-30 NOTE — Progress Notes (Signed)
ANTICOAGULATION CONSULT NOTE - Initial Consult  Pharmacy Consult for heparin Indication: pulmonary embolus  No Known Allergies  Patient Measurements: Height: 5\' 2"  (157.5 cm) Weight: 165 lb (74.8 kg) IBW/kg (Calculated) : 50.1 Heparin Dosing Weight: 66kg  Vital Signs: Temp: 97.5 F (36.4 C) (02/01 1743) Temp Source: Oral (02/01 1743) BP: 179/66 (02/01 2030) Pulse Rate: 85 (02/01 2030)  Labs: Recent Labs    06/30/17 1741  HGB 9.3*  HCT 29.7*  PLT 458*  CREATININE 1.41*    Estimated Creatinine Clearance: 31.6 mL/min (A) (by C-G formula based on SCr of 1.41 mg/dL (H)).   Medical History: Past Medical History:  Diagnosis Date  . Arthritis   . 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 Bladder cancer ([place])   . Breast cancer ([place])   . Chronic kidney disease    does not empty bladder,self cath  . Complication of anesthesia   . GERD (gastroesophageal reflux disease)   . History of radiation therapy 05/02/16-06/07/16   pelvis 55 Gy in 25 fractions  . Hypertension   . Mitral valve prolapse   . Personal history of radiation therapy 2005  . PONV (postoperative nausea and vomiting)   . Self-catheterizes urinary bladder     Medications:  Infusions:  . heparin      Assessment: 10 yof presented to the ED with SOB. Pt with recent hip surgery on 1/23. Found to have a PE with right heart strain. To start IV heparin. Baseline H/H slightly low and platelets are elevated. She is not on anticoagulation PTA.   Goal of Therapy:  Heparin level 0.3-0.7 units/ml Monitor platelets by anticoagulation protocol: Yes   Plan:  Heparin bolus 4000 units IV x 1 Heparin gtt 1100 units/hr Check an 8 hr heparin level Daily heparin level and CBC  Nikie Cid, Rande Lawman 06/30/2017,8:38 PM

## 2017-07-01 ENCOUNTER — Other Ambulatory Visit: Payer: Self-pay

## 2017-07-01 ENCOUNTER — Inpatient Hospital Stay (HOSPITAL_COMMUNITY): Payer: Medicare Other

## 2017-07-01 ENCOUNTER — Encounter (HOSPITAL_COMMUNITY): Payer: Self-pay | Admitting: *Deleted

## 2017-07-01 DIAGNOSIS — I10 Essential (primary) hypertension: Secondary | ICD-10-CM

## 2017-07-01 DIAGNOSIS — I361 Nonrheumatic tricuspid (valve) insufficiency: Secondary | ICD-10-CM

## 2017-07-01 DIAGNOSIS — I2699 Other pulmonary embolism without acute cor pulmonale: Secondary | ICD-10-CM

## 2017-07-01 DIAGNOSIS — R0602 Shortness of breath: Secondary | ICD-10-CM

## 2017-07-01 LAB — HEPARIN LEVEL (UNFRACTIONATED)
HEPARIN UNFRACTIONATED: 0.33 [IU]/mL (ref 0.30–0.70)
Heparin Unfractionated: 0.33 IU/mL (ref 0.30–0.70)

## 2017-07-01 LAB — CBC
HEMATOCRIT: 26 % — AB (ref 36.0–46.0)
HEMOGLOBIN: 8.3 g/dL — AB (ref 12.0–15.0)
MCH: 30.7 pg (ref 26.0–34.0)
MCHC: 31.9 g/dL (ref 30.0–36.0)
MCV: 96.3 fL (ref 78.0–100.0)
Platelets: 410 10*3/uL — ABNORMAL HIGH (ref 150–400)
RBC: 2.7 MIL/uL — AB (ref 3.87–5.11)
RDW: 15.6 % — ABNORMAL HIGH (ref 11.5–15.5)
WBC: 8.4 10*3/uL (ref 4.0–10.5)

## 2017-07-01 LAB — ECHOCARDIOGRAM COMPLETE
Height: 62 in
WEIGHTICAEL: 2846.58 [oz_av]

## 2017-07-01 LAB — GLUCOSE, CAPILLARY
Glucose-Capillary: 80 mg/dL (ref 65–99)
Glucose-Capillary: 103 mg/dL — ABNORMAL HIGH (ref 65–99)

## 2017-07-01 MED ORDER — HYDRALAZINE HCL 20 MG/ML IJ SOLN: 10.0000 mg | Freq: Three times a day (TID) | INTRAMUSCULAR | Status: DC | PRN

## 2017-07-01 NOTE — Progress Notes (Signed)
*  PRELIMINARY RESULTS* Echocardiogram 2D Echocardiogram has been performed.  Leavy Cella 07/01/2017, 2:20 PM

## 2017-07-01 NOTE — Progress Notes (Addendum)
VASCULAR LAB PRELIMINARY  PRELIMINARY  PRELIMINARY  PRELIMINARY  Bilateral lower extremity venous duplex completed.    Preliminary report:  There is acute DVT noted in the left popliteal, posterior tibial, and peroneal veins.  There is sluggish flow noted in the right common femoral vein, possibly from a more proximal compression of neobladder.   Antha Niday, RVT 07/01/2017, 1:01 PM

## 2017-07-01 NOTE — Progress Notes (Signed)
ANTICOAGULATION CONSULT NOTE   Pharmacy Consult for Heparin Indication: pulmonary embolus  No Known Allergies  Patient Measurements: Height: 5\' 2"  (157.5 cm) Weight: 177 lb 14.6 oz (80.7 kg) IBW/kg (Calculated) : 50.1 Heparin Dosing Weight: 66kg  Vital Signs: Temp: 98.1 F (36.7 C) (02/01 2242) Temp Source: Oral (02/01 2242) BP: 144/87 (02/01 2242) Pulse Rate: 93 (02/01 2242)  Labs: Recent Labs    06/30/17 1741 07/01/17 0456  HGB 9.3* 8.3*  HCT 29.7* 26.0*  PLT 458* 410*  HEPARINUNFRC  --  0.33  CREATININE 1.41*  --     Estimated Creatinine Clearance: 32.9 mL/min (A) (by C-G formula based on SCr of 1.41 mg/dL (H)).   Medical History: Past Medical History:  Diagnosis Date  . Arthritis   . 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 Bladder cancer ([place])   . Breast cancer ([place])   . Chronic kidney disease    does not empty bladder,self cath  . Complication of anesthesia   . GERD (gastroesophageal reflux disease)   . History of radiation therapy 05/02/16-06/07/16   pelvis 55 Gy in 25 fractions  . Hypertension   . Mitral valve prolapse   . Personal history of radiation therapy 2005  . PONV (postoperative nausea and vomiting)   . Self-catheterizes urinary bladder     Medications:  Infusions:  . heparin 1,100 Units/hr (06/30/17 2047)  . levofloxacin (LEVAQUIN) IV Stopped (07/01/17 0136)    Assessment: 67 yof presented to the ED with SOB. Pt with recent hip surgery on 1/23. Found to have a PE with right heart strain. To start IV heparin. Baseline H/H slightly low and platelets are elevated. She is not on anticoagulation PTA.   2/2 AM: initial heparin level within therapeutic range  Goal of Therapy:  Heparin level 0.3-0.7 units/ml Monitor platelets by anticoagulation protocol: Yes   Plan:  Cont heparin at 1100 units/hr 1200 heparin level  Narda Bonds, PharmD, BCPS Clinical Pharmacist Phone: [phone]

## 2017-07-01 NOTE — Progress Notes (Signed)
Caitlin [name]  is a 77 y.o. female, who recently underwent left femur repair, on January 22, intramedullary nail by Dr. Berenice Primas for a left proximal femur fracture.  Postoperatively she was treated with aspirin enteric-coated 325 mg twice daily for DVT prophylaxis. She came to the hospital with a 2 days history of shortness of breath, nausea, mild chest discomfort, and some leg swelling. CT of the chest showed bilateral PEs and patient will be admitted for IV heparin. The patient was seen today.  She reports that her breathing is much improved.  Her left leg wounds are benign.  Her left calf is soft and nontender. We appreciate  Medicine's care of her.She can 50% weightbearing left lower extremity. She will need physical therapy when okay with medicine to get up. Please call us if needed  Dr. Berenice Primas cell phone number is [phone]

## 2017-07-01 NOTE — Progress Notes (Signed)
[facility] PROGRESS NOTE  Caitlin [name] BJS:[id number] DOB: [birth year] DOA: 06/30/2017 PCP: Prince Solian, MD  Assessment/Plan:  1. Pulmonary embolism with right heart strain. 2. Status post intramedullary nail in left femur fracture on 06/20/2017 3. History of breast and bladder cancer with absent bladder 4. History of arthritis 5. Urinary tract infection 6. HTN  Plan  Admit to step down Cont Heparin IV Cont IV antibiotics Checking echocardiogram Consulted PCM Cont norvasc, prn hydralazine     Code Status: FC Family Communication: husband at bedside (indicate person spoken with, relationship, and if by phone, the number) Disposition Plan: home   Consultants:  CCM  Procedures:  ---  Antibiotics:  n/a (indicate start date, and stop date if known)  HPI/Subjective: Patient states her breathing is better.  Denies fevers and chills.  No hemoptysis.  Objective: Vitals:   07/01/17 0751 07/01/17 0808  BP:    Pulse:    Resp:    Temp:  97.9 F (36.6 C)  SpO2: 95%     Intake/Output Summary (Last 24 hours) at 07/01/2017 1254 Last data filed at 07/01/2017 0700 Gross per 24 hour  Intake 482.98 ml  Output 1050 ml  Net -567.02 ml   Filed Weights   06/30/17 1744 06/30/17 2235  Weight: 74.8 kg (165 lb) 80.7 kg (177 lb 14.6 oz)    Exam:   General:  [place]/AT  Cardiovascular: RRR, no MRG  Respiratory: mild tachypnea, nl wob  Abdomen: ND, NTTP, BS+  Musculoskeletal: moving all extr   Data Reviewed: Basic Metabolic Panel: Recent Labs  Lab 06/30/17 1741  NA 142  K 3.4*  CL 109  CO2 20*  GLUCOSE 98  BUN 18  CREATININE 1.41*  CALCIUM 9.1  MG 2.1   Liver Function Tests: Recent Labs  Lab 06/30/17 1741  AST 38  ALT 28  ALKPHOS 99  BILITOT 0.6  PROT 6.9  ALBUMIN 3.1*   No results for input(s): LIPASE, AMYLASE in the last 168 hours. No results for input(s): AMMONIA in the last 168 hours. CBC: Recent Labs  Lab 06/30/17 1741  07/01/17 0456  WBC 9.3 8.4  NEUTROABS 7.1  --   HGB 9.3* 8.3*  HCT 29.7* 26.0*  MCV 97.4 96.3  PLT 458* 410*   Cardiac Enzymes: No results for input(s): CKTOTAL, CKMB, CKMBINDEX, TROPONINI in the last 168 hours. BNP (last 3 results) Recent Labs    06/30/17 1920  BNP 251.6*    ProBNP (last 3 results) No results for input(s): PROBNP in the last 8760 hours.  CBG: Recent Labs  Lab 07/01/17 1159  GLUCAP 80    No results found for this or any previous visit (from the past 240 hour(s)).   Studies: Dg Chest 2 View  Result Date: 06/30/2017 CLINICAL DATA:  Pt in c/o SOB on exertion onset yesterday, pt discharged last wk for fractured L femur, pt denies CP EXAM: CHEST  2 VIEW COMPARISON:  06/21/2017 FINDINGS: The heart is mildly enlarged. There are no focal consolidations or pleural effusions. No pulmonary edema. IMPRESSION: Stable, mild cardiomegaly. No evidence for acute pulmonary abnormality. Electronically Signed   By: Nolon Nations M.D.   On: 06/30/2017 18:21   Ct Angio Chest Pe W/cm &/or Wo Cm  Result Date: 06/30/2017 CLINICAL DATA:  C/o SOB onset yesterday, pt discharged last wk for fractured L femur,sp surgery EXAM: CT ANGIOGRAPHY CHEST WITH CONTRAST TECHNIQUE: Multidetector CT imaging of the chest was performed using the standard protocol during bolus administration of intravenous contrast.  Multiplanar CT image reconstructions and MIPs were obtained to evaluate the vascular anatomy. CONTRAST:  112mL ISOVUE-370 IOPAMIDOL (ISOVUE-370) INJECTION 76% COMPARISON:  Chest x-ray 06/30/2017 and chest CT 03/08/2016 FINDINGS: Cardiovascular: There are numerous pulmonary emboli involving upper and lower lobe pulmonary arteries bilaterally. RV: LV ratio is 1.2, compatible with right heart strain. Heart size is normal. No significant pericardial effusion. There are coronary artery calcifications. There is atherosclerotic calcification of the thoracic aorta not associated with aneurysm.  Mediastinum/Nodes: The visualized portion of the thyroid gland has a normal appearance. No mediastinal, hilar, or axillary adenopathy. There is a hiatal hernia. Lungs/Pleura: There are patchy pleural based lung opacities at the apices, right greater than left and stable compared with prior CT exam. No pleural effusions. No suspicious pulmonary nodules. There are dependent changes in the lungs. Upper Abdomen: No acute abnormality. Musculoskeletal: No chest wall abnormality. No acute or significant osseous findings. Review of the MIP images confirms the above findings. IMPRESSION: 1. Positive for acute PE with CTevidence of right heart strain (RV/LV Ratio = ) consistent with at least submassive (intermediate risk) PE. The presence of right heart strain has been associated with an increased risk of morbidity and mortality. 2. Coronary artery disease. 3.  Aortic atherosclerosis.  (ICD10-I70.0) Critical Value/emergent results were called by telephone at the time of interpretation on 06/30/2017 at 8:04 pm to Dr. Threasa Beards BELFI , who verbally acknowledged these results. Electronically Signed   By: Nolon Nations M.D.   On: 06/30/2017 20:10    Scheduled Meds: . amLODipine  5 mg Oral Daily  . ferrous sulfate  325 mg Oral Q breakfast  . furosemide  20 mg Oral Daily  . ipratropium-albuterol  3 mL Nebulization Q6H  . loratadine  10 mg Oral Daily  . pantoprazole  40 mg Oral Daily   Continuous Infusions: . heparin 1,100 Units/hr (06/30/17 2047)  . levofloxacin (LEVAQUIN) IV Stopped (07/01/17 0136)    Active Problems:   Pulmonary embolism ([place])    Time spent: [place] [facility] Pager [place]. If 7PM-7AM, please contact night-coverage at [email or web address], password [facility] 07/01/2017, 12:54 PM  LOS: 1 day

## 2017-07-01 NOTE — Progress Notes (Signed)
ANTICOAGULATION CONSULT NOTE - Follow Up Consult  Pharmacy Consult for heparin Indication: pulmonary embolus  No Known Allergies  Patient Measurements: Height: 5\' 2"  (157.5 cm) Weight: 177 lb 14.6 oz (80.7 kg) IBW/kg (Calculated) : 50.1 Heparin Dosing Weight: 66kg  Vital Signs: Temp: 97.9 F (36.6 C) (02/02 0808) Temp Source: Oral (02/02 0808)  Labs: Recent Labs    06/30/17 1741 07/01/17 0456 07/01/17 1115  HGB 9.3* 8.3*  --   HCT 29.7* 26.0*  --   PLT 458* 410*  --   HEPARINUNFRC  --  0.33 0.33  CREATININE 1.41*  --   --     Estimated Creatinine Clearance: 32.9 mL/min (A) (by C-G formula based on SCr of 1.41 mg/dL (H)).  Medications: Heparin @ 1100 units/hr  Assessment: 5 yof presented to the ED with SOB. Pt with recent hip surgery on 1/23. Found to have a PE with right heart strain and started on heparin. Confirmatory heparin level therapeutic at 0.33. Hgb low but stable, platelets wnl. No bleeding.  Goal of Therapy:  Heparin level 0.3-0.7 units/ml Monitor platelets by anticoagulation protocol: Yes   Plan:  1) Continue heparin at 1100 units/hr 2) Daily heparin level and CBC  Deboraha Sprang 07/01/2017,12:31 PM

## 2017-07-02 LAB — CBC
HEMATOCRIT: 27.2 % — AB (ref 36.0–46.0)
Hemoglobin: 8.6 g/dL — ABNORMAL LOW (ref 12.0–15.0)
MCH: 31 pg (ref 26.0–34.0)
MCHC: 31.6 g/dL (ref 30.0–36.0)
MCV: 98.2 fL (ref 78.0–100.0)
Platelets: 443 10*3/uL — ABNORMAL HIGH (ref 150–400)
RBC: 2.77 MIL/uL — AB (ref 3.87–5.11)
RDW: 15.9 % — AB (ref 11.5–15.5)
WBC: 7.6 10*3/uL (ref 4.0–10.5)

## 2017-07-02 LAB — BASIC METABOLIC PANEL
ANION GAP: 10 (ref 5–15)
BUN: 17 mg/dL (ref 6–20)
CO2: 23 mmol/L (ref 22–32)
Calcium: 8.4 mg/dL — ABNORMAL LOW (ref 8.9–10.3)
Chloride: 106 mmol/L (ref 101–111)
Creatinine, Ser: 1.3 mg/dL — ABNORMAL HIGH (ref 0.44–1.00)
GFR calc Af Amer: 45 mL/min — ABNORMAL LOW (ref >60)
GFR calc non Af Amer: 39 mL/min — ABNORMAL LOW (ref >60)
GLUCOSE: 97 mg/dL (ref 65–99)
POTASSIUM: 3.9 mmol/L (ref 3.5–5.1)
Sodium: 139 mmol/L (ref 135–145)

## 2017-07-02 LAB — HEPARIN LEVEL (UNFRACTIONATED): Heparin Unfractionated: 0.37 IU/mL (ref 0.30–0.70)

## 2017-07-02 LAB — GLUCOSE, CAPILLARY
GLUCOSE-CAPILLARY: 112 mg/dL — AB (ref 65–99)
Glucose-Capillary: 91 mg/dL (ref 65–99)
Glucose-Capillary: 93 mg/dL (ref 65–99)

## 2017-07-02 MED ORDER — IPRATROPIUM-ALBUTEROL 0.5-2.5 (3) MG/3ML IN SOLN: 3.0000 mL | Freq: Four times a day (QID) | RESPIRATORY_TRACT | Status: DC | PRN

## 2017-07-02 MED ORDER — LEVOFLOXACIN 750 MG PO TABS
750.0000 mg | ORAL_TABLET | ORAL | Status: DC
  Administered 2017-07-02: 750 mg via ORAL
  Filled 2017-07-02: qty 1

## 2017-07-02 MED ORDER — RIVAROXABAN 20 MG PO TABS: 20.0000 mg | ORAL_TABLET | Freq: Every day | ORAL | Status: DC

## 2017-07-02 MED ORDER — RIVAROXABAN 15 MG PO TABS
15.0000 mg | ORAL_TABLET | Freq: Two times a day (BID) | ORAL | Status: DC
  Administered 2017-07-02 – 2017-07-03 (×3): 15 mg via ORAL
  Filled 2017-07-02 (×3): qty 1

## 2017-07-02 NOTE — Progress Notes (Signed)
[facility] PROGRESS NOTE  Caitlin [name] KDT:[id number] DOB: 09-15-1940 DOA: 06/30/2017 PCP: Prince Solian, MD  Assessment/Plan:  1. Pulmonary embolism 2. Status post intramedullary nail in left femur fracture on 06/20/2017 3. History of breast and bladder cancer with absent bladder 4. History of arthritis 5. Urinary tract infection 6. HTN  Plan  Admit to step down Stopped Heparin IV, start Xarelto 2/3 Cont IV antibiotics-->PO Echocardiogram, shows Grade 1 diastolic Dysf Consulted PCM Cont norvasc, prn hydralazine  Code Status: FC Family Communication: husband at bedside (indicate person spoken with, relationship, and if by phone, the number) Disposition Plan: home   Consultants:  CCM  Procedures:  ---  Antibiotics:  n/a (indicate start date, and stop date if known)  HPI/Subjective: Patient states her breathing is better today. Leg pain control.  Objective: Vitals:   07/02/17 0900 07/02/17 0950  BP:  134/70  Pulse:    Resp:  20  Temp:    SpO2: 97%     Intake/Output Summary (Last 24 hours) at 07/02/2017 1028 Last data filed at 07/02/2017 0840 Gross per 24 hour  Intake 480 ml  Output 2475 ml  Net -1995 ml   Filed Weights   06/30/17 1744 06/30/17 2235  Weight: 74.8 kg (165 lb) 80.7 kg (177 lb 14.6 oz)    Exam:   General:  [place]/AT  Cardiovascular: RRR, no MRG  Respiratory: mild tachypnea, nl wob  Abdomen: ND, NTTP, BS+  Musculoskeletal: moving all extr   Data Reviewed: Basic Metabolic Panel: Recent Labs  Lab 06/30/17 1741 07/02/17 0511  NA 142 139  K 3.4* 3.9  CL 109 106  CO2 20* 23  GLUCOSE 98 97  BUN 18 17  CREATININE 1.41* 1.30*  CALCIUM 9.1 8.4*  MG 2.1  --    Liver Function Tests: Recent Labs  Lab 06/30/17 1741  AST 38  ALT 28  ALKPHOS 99  BILITOT 0.6  PROT 6.9  ALBUMIN 3.1*   No results for input(s): LIPASE, AMYLASE in the last 168 hours. No results for input(s): AMMONIA in the last 168 hours. CBC: Recent  Labs  Lab 06/30/17 1741 07/01/17 0456 07/02/17 0511  WBC 9.3 8.4 7.6  NEUTROABS 7.1  --   --   HGB 9.3* 8.3* 8.6*  HCT 29.7* 26.0* 27.2*  MCV 97.4 96.3 98.2  PLT 458* 410* 443*   Cardiac Enzymes: No results for input(s): CKTOTAL, CKMB, CKMBINDEX, TROPONINI in the last 168 hours. BNP (last 3 results) Recent Labs    06/30/17 1920  BNP 251.6*    ProBNP (last 3 results) No results for input(s): PROBNP in the last 8760 hours.  CBG: Recent Labs  Lab 07/01/17 1159 07/01/17 1614  GLUCAP 80 103*    No results found for this or any previous visit (from the past 240 hour(s)).   Studies: Dg Chest 2 View  Result Date: 06/30/2017 CLINICAL DATA:  Pt in c/o SOB on exertion onset yesterday, pt discharged last wk for fractured L femur, pt denies CP EXAM: CHEST  2 VIEW COMPARISON:  06/21/2017 FINDINGS: The heart is mildly enlarged. There are no focal consolidations or pleural effusions. No pulmonary edema. IMPRESSION: Stable, mild cardiomegaly. No evidence for acute pulmonary abnormality. Electronically Signed   By: Nolon Nations M.D.   On: 06/30/2017 18:21   Ct Angio Chest Pe W/cm &/or Wo Cm  Result Date: 06/30/2017 CLINICAL DATA:  C/o SOB onset yesterday, pt discharged last wk for fractured L femur,sp surgery EXAM: CT ANGIOGRAPHY CHEST WITH CONTRAST  TECHNIQUE: Multidetector CT imaging of the chest was performed using the standard protocol during bolus administration of intravenous contrast. Multiplanar CT image reconstructions and MIPs were obtained to evaluate the vascular anatomy. CONTRAST:  179mL ISOVUE-370 IOPAMIDOL (ISOVUE-370) INJECTION 76% COMPARISON:  Chest x-ray 06/30/2017 and chest CT 03/08/2016 FINDINGS: Cardiovascular: There are numerous pulmonary emboli involving upper and lower lobe pulmonary arteries bilaterally. RV: LV ratio is 1.2, compatible with right heart strain. Heart size is normal. No significant pericardial effusion. There are coronary artery calcifications. There is  atherosclerotic calcification of the thoracic aorta not associated with aneurysm. Mediastinum/Nodes: The visualized portion of the thyroid gland has a normal appearance. No mediastinal, hilar, or axillary adenopathy. There is a hiatal hernia. Lungs/Pleura: There are patchy pleural based lung opacities at the apices, right greater than left and stable compared with prior CT exam. No pleural effusions. No suspicious pulmonary nodules. There are dependent changes in the lungs. Upper Abdomen: No acute abnormality. Musculoskeletal: No chest wall abnormality. No acute or significant osseous findings. Review of the MIP images confirms the above findings. IMPRESSION: 1. Positive for acute PE with CTevidence of right heart strain (RV/LV Ratio = ) consistent with at least submassive (intermediate risk) PE. The presence of right heart strain has been associated with an increased risk of morbidity and mortality. 2. Coronary artery disease. 3.  Aortic atherosclerosis.  (ICD10-I70.0) Critical Value/emergent results were called by telephone at the time of interpretation on 06/30/2017 at 8:04 pm to Dr. Threasa Beards BELFI , who verbally acknowledged these results. Electronically Signed   By: Nolon Nations M.D.   On: 06/30/2017 20:10    Scheduled Meds: . amLODipine  5 mg Oral Daily  . ferrous sulfate  325 mg Oral Q breakfast  . furosemide  20 mg Oral Daily  . loratadine  10 mg Oral Daily  . pantoprazole  40 mg Oral Daily  . Rivaroxaban  15 mg Oral BID WC   Continuous Infusions: . levofloxacin (LEVAQUIN) IV Stopped (07/01/17 0136)    Active Problems:   Pulmonary embolism ([place])    Time spent: [place] [facility] Pager [place]. If 7PM-7AM, please contact night-coverage at [email or web address], password [email or web address] 07/02/2017, 10:28 AM  LOS: 2 days

## 2017-07-02 NOTE — Consult Note (Deleted)
PULMONARY / CRITICAL CARE MEDICINE   Name: MERANDA DECHAINE MRN: [id number] DOB: 07-04-1940    ADMISSION DATE:  06/30/2017 CONSULTATION DATE:  07/02/17  REFERRING MD:  [facility]/ Dr Aggie Moats  CHIEF COMPLAINT:  sob  HISTORY OF PRESENT ILLNESS:   89 yowf s/p L Feb nail 06/21/16 with sob onset 06/29/17 sob to ER 2/1 with dx of bilateral PE by CTa and acute L DVT by venous doppler and rx with Hep transitioned to xarelto on 2/3 and ccm consulted   Pt feeling much better on day of eval but has not tried to walk yet.  Denies pleuritic cp/ hemptysis   No obvious reported  day to day or daytime variability or assoc excess/ purulent sputum or mucus plugs or chest tightness, subjective wheeze or overt sinus or hb symptoms. No unusual exposure hx or h/o childhood pna/ asthma or knowledge of premature birth.  Sleeping ok flat without nocturnal  or early am exacerbation  of respiratory  c/o's or need for noct saba. Also denies any obvious fluctuation of symptoms with weather or environmental changes or other aggravating or alleviating factors except as outlined above   Current Allergies, Complete Past Medical History, Past Surgical History, Family History, and Social History were reviewed in [employer] record.  ROS  The following are not active complaints unless bolded Hoarseness, sore throat, dysphagia, dental problems, itching, sneezing,  nasal congestion or discharge of excess mucus or purulent secretions, ear ache,   fever, chills, sweats, unintended wt loss or wt gain, classically pleuritic or exertional cp,  orthopnea pnd or leg swelling chronic but much worse L > R x 2 d pta , presyncope, palpitations, abdominal pain, anorexia, nausea, vomiting, diarrhea  or change in bowel habits or change in bladder habits, change in stools or change in urine, dysuria, hematuria,  rash, arthralgias, visual complaints, headache, numbness, weakness or ataxia or problems with walking or coordination,  change in  mood/affect or memory.        Current Meds  Medication Sig  . acetaminophen (TYLENOL) 325 MG tablet Take 650 mg by mouth every 6 (six) hours as needed for pain.  [name] amLODipine (NORVASC) 5 MG tablet Take 5 mg by mouth daily.   [name] aspirin EC 325 MG tablet Take 1 tablet (325 mg total) by mouth 2 (two) times daily after a meal. Take x 1 month post op to decrease risk of blood clots.  . cetirizine (ZYRTEC) 10 MG tablet Take 10 mg by mouth daily.  . Cholecalciferol (VITAMIN D3) 5000 units TABS Take 1 tablet by mouth daily.   [name] escitalopram (LEXAPRO) 10 MG tablet Take 10 mg by mouth daily.  . ferrous sulfate 325 (65 FE) MG tablet Take 325 mg by mouth daily with breakfast.  . fluticasone (FLONASE) 50 MCG/ACT nasal spray Place 1 spray into the nose daily as needed for allergies.   [name] HYDROcodone-acetaminophen (NORCO) 5-325 MG tablet Take 1-2 tablets by mouth every 6 (six) hours as needed for moderate pain.  . Lactobacillus (PROBIOTIC ACIDOPHILUS PO) Take 1 capsule by mouth daily.  [name] loperamide (IMODIUM) 2 MG capsule Take 2 mg by mouth as needed for diarrhea or loose stools.  . Menthol, Topical Analgesic, (BENGAY ULTRA STRENGTH EX) Apply 1 application topically as needed (hip pain).  [name] omeprazole (PRILOSEC) 20 MG capsule Take 20 mg by mouth daily.   [name] VITAMIN B1-B12 IJ Inject as directed every 30 (thirty) days.  . [DISCONTINUED] traMADol (ULTRAM) 50 MG tablet Take 50  mg by mouth every 6 (six) hours as needed for moderate pain or severe pain.       PAST MEDICAL HISTORY :  She  has a past medical history of Arthritis, Bladder absent (Pt had bladder removed in 2010), Bladder cancer ([place]), Breast cancer ([place]), Chronic kidney disease, Complication of anesthesia, GERD (gastroesophageal reflux disease), History of radiation therapy (05/02/16-06/07/16), Hypertension, Mitral valve prolapse, Personal history of radiation therapy (2005), PONV (postoperative nausea and vomiting), and Self-catheterizes urinary  bladder.  PAST SURGICAL HISTORY: She  has a past surgical history that includes Replacement total knee (Left); Abdominal hysterectomy (1980); Breast surgery (2005); Bladder surgery (2009); Cholecystectomy (N/A, 02/19/2013); Anterior and posterior repair (N/A, 01/25/2016); ir generic historical (05/03/2016); ir generic historical (05/03/2016); ir generic historical (05/31/2016); ir generic historical (05/31/2016); Breast lumpectomy (Left, 2005); Breast biopsy (Left, 2005); and Femur IM nail (Left, 06/21/2017).  No Known Allergies  No current facility-administered medications on file prior to encounter.    Current Outpatient Medications on File Prior to Encounter  Medication Sig  . acetaminophen (TYLENOL) 325 MG tablet Take 650 mg by mouth every 6 (six) hours as needed for pain.  [name] amLODipine (NORVASC) 5 MG tablet Take 5 mg by mouth daily.   [name] aspirin EC 325 MG tablet Take 1 tablet (325 mg total) by mouth 2 (two) times daily after a meal. Take x 1 month post op to decrease risk of blood clots.  . cetirizine (ZYRTEC) 10 MG tablet Take 10 mg by mouth daily.  . Cholecalciferol (VITAMIN D3) 5000 units TABS Take 1 tablet by mouth daily.   [name] escitalopram (LEXAPRO) 10 MG tablet Take 10 mg by mouth daily.  . ferrous sulfate 325 (65 FE) MG tablet Take 325 mg by mouth daily with breakfast.  . fluticasone (FLONASE) 50 MCG/ACT nasal spray Place 1 spray into the nose daily as needed for allergies.   [name] HYDROcodone-acetaminophen (NORCO) 5-325 MG tablet Take 1-2 tablets by mouth every 6 (six) hours as needed for moderate pain.  . Lactobacillus (PROBIOTIC ACIDOPHILUS PO) Take 1 capsule by mouth daily.  [name] loperamide (IMODIUM) 2 MG capsule Take 2 mg by mouth as needed for diarrhea or loose stools.  . Menthol, Topical Analgesic, (BENGAY ULTRA STRENGTH EX) Apply 1 application topically as needed (hip pain).  [name] omeprazole (PRILOSEC) 20 MG capsule Take 20 mg by mouth daily.   [name] VITAMIN B1-B12 IJ Inject as directed every 30  (thirty) days.  . furosemide (LASIX) 20 MG tablet Take 20 mg by mouth daily.  . prochlorperazine (COMPAZINE) 10 MG tablet Take 1 tablet (10 mg total) by mouth every 6 (six) hours as needed for nausea or vomiting. (Patient not taking: Reported on 07/07/2016)  . telmisartan (MICARDIS) 20 MG tablet Take 1 tablet (20 mg total) by mouth daily. (Patient not taking: Reported on 06/30/2017)    FAMILY HISTORY:  Her indicated that her mother is deceased. She indicated that her father is deceased. She indicated that the status of her sister is unknown.   SOCIAL HISTORY: She  reports that  has never smoked. she has never used smokeless tobacco. She reports that she does not drink alcohol or use drugs.     SUBJECTIVE:  Comfortable in recliner with legs up on RA  VITAL SIGNS: BP 127/65 (BP Location: Right Arm)   Pulse 90   Temp 98 F (36.7 C) (Oral)   Resp (!) 29   Ht 5\' 2"  (1.575 m)   Wt 177 lb 14.6 oz (80.7 kg)  SpO2 97%   BMI 32.54 kg/m   HEMODYNAMICS:    VENTILATOR SETTINGS:    INTAKE / OUTPUT: I/O last 3 completed shifts: In: 723 [P.O.:510; I.V.:53; Other:10; IV Piggyback:150] Out: 3325 [Urine:3325]  PHYSICAL EXAMINATION: Pleasant elderly wf nad    No jvd Oropharynx clear Neck supple Lungs with a few scattered exp > insp rhonchi bilaterally RRR no s3 or or sign murmur - slt increase P2  Abd   with nl insp excursion  Extr wam with no edema or clubbing noted and mild L >R edema s calf tenderness  Neuro  Alert/approp with nl sensorium and  No obvious motor deficits    LABS:  BMET Recent Labs  Lab 06/30/17 1741 07/02/17 0511  NA 142 139  K 3.4* 3.9  CL 109 106  CO2 20* 23  BUN 18 17  CREATININE 1.41* 1.30*  GLUCOSE 98 97    Electrolytes Recent Labs  Lab 06/30/17 1741 07/02/17 0511  CALCIUM 9.1 8.4*  MG 2.1  --     CBC Recent Labs  Lab 06/30/17 1741 07/01/17 0456 07/02/17 0511  WBC 9.3 8.4 7.6  HGB 9.3* 8.3* 8.6*  HCT 29.7* 26.0* 27.2*  PLT 458*  410* 443*    Coag's No results for input(s): APTT, INR in the last 168 hours.  Sepsis Markers No results for input(s): LATICACIDVEN, PROCALCITON, O2SATVEN in the last 168 hours.  ABG No results for input(s): PHART, PCO2ART, PO2ART in the last 168 hours.  Liver Enzymes Recent Labs  Lab 06/30/17 1741  AST 38  ALT 28  ALKPHOS 99  BILITOT 0.6  ALBUMIN 3.1*    Cardiac Enzymes No results for input(s): TROPONINI, PROBNP in the last 168 hours.  Glucose Recent Labs  Lab 07/01/17 1159 07/01/17 1614 07/02/17 1117  GLUCAP 80 103* 91    Imaging No results found.    Studies: CTa  06/30/17  1. Positive for acute PE with CTevidence of right heart strain (RV/LV Ratio = ) consistent with at least submassive (intermediate risk) PE. The presence of right heart strain has been associated with an increased risk of morbidity and mortality.  Venous dopplers 06/30/17 Pos Acute L DVT  Echo 07/01/17  - Right ventricle: The cavity size was normal. Wall thickness was   normal. Systolic function was normal. - Pulmonary arteries: Systolic pressure was moderately increased.   PA peak pressure: 50 mm Hg (S).  ASSESSMENT / PLAN:    1)   Moderate /severe PE provoked, with  clot burden resulting in  RV strain already improved on  Day #2 hep and ok to change over to DOAC  Comment:  There are 5 different presentations for PE from asympt to sudden death, and though there is significant PH with this one she presented with dyspnea/ not hemodynamic decompensation, and looks great today.  Therefore rec DOAC fine here Needs repeat echo in 3 months and rx for at least 6 months would only d/c it then if echo is no and repeat venous dopplers have normalized, otherwise continue for a full year and repeat the eval.  Dr Dagmar Hait is an excellent internist and can assume her care as outpt/ pulmoary f/u is prn     Christinia Gully, MD Pulmonary and [place]  [phone] After 5:30 PM or weekends, use Beeper [phone]

## 2017-07-02 NOTE — Discharge Instructions (Signed)
Information on my medicine - XARELTO (rivaroxaban)  This medication education was reviewed with me or my healthcare representative as part of my discharge preparation.    WHY WAS XARELTO PRESCRIBED FOR YOU? Xarelto was prescribed to treat blood clots that may have been found in the veins of your legs (deep vein thrombosis) or in your lungs (pulmonary embolism) and to reduce the risk of them occurring again.  What do you need to know about Xarelto? The starting dose is one 15 mg tablet taken TWICE daily with food for the FIRST 21 DAYS then on 07/23/17  the dose is changed to one 20 mg tablet taken ONCE A DAY with your evening meal.  DO NOT stop taking Xarelto without talking to the health care provider who prescribed the medication.  Refill your prescription for 20 mg tablets before you run out.  After discharge, you should have regular check-up appointments with your healthcare provider that is prescribing your Xarelto.  In the future your dose may need to be changed if your kidney function changes by a significant amount.  What do you do if you miss a dose? If you are taking Xarelto TWICE DAILY and you miss a dose, take it as soon as you remember. You may take two 15 mg tablets (total 30 mg) at the same time then resume your regularly scheduled 15 mg twice daily the next day.  If you are taking Xarelto ONCE DAILY and you miss a dose, take it as soon as you remember on the same day then continue your regularly scheduled once daily regimen the next day. Do not take two doses of Xarelto at the same time.   Important Safety Information Xarelto is a blood thinner medicine that can cause bleeding. You should call your healthcare provider right away if you experience any of the following: ? Bleeding from an injury or your nose that does not stop. ? Unusual colored urine (red or dark brown) or unusual colored stools (red or black). ? Unusual bruising for unknown reasons. ? A serious fall or  if you hit your head (even if there is no bleeding).  Some medicines may interact with Xarelto and might increase your risk of bleeding while on Xarelto. To help avoid this, consult your healthcare provider or pharmacist prior to using any new prescription or non-prescription medications, including herbals, vitamins, non-steroidal anti-inflammatory drugs (NSAIDs) and supplements.  This website has more information on Xarelto: [email or web address]

## 2017-07-02 NOTE — Progress Notes (Signed)
Patient up in room to chair today will monitor patient. Islam Villescas, Bettina Gavia RN

## 2017-07-02 NOTE — Progress Notes (Signed)
PULMONARY / CRITICAL CARE MEDICINE   Name: Caitlin [name] MRN: [id number] DOB: 1941/05/09    ADMISSION DATE:  06/30/2017 CONSULTATION DATE:  07/02/17  REFERRING MD:  [facility]/ Dr Aggie Moats  CHIEF COMPLAINT:  sob  HISTORY OF PRESENT ILLNESS:   29 yowf s/p L Feb nail 06/21/16 with sob onset 06/29/17 sob to ER 2/1 with dx of bilateral PE by CTa and acute L DVT by venous doppler and rx with Hep transitioned to xarelto on 2/3 and ccm consulted   Pt feeling much better on day of eval but has not tried to walk yet.  Denies pleuritic cp/ hemptysis   No obvious reported  day to day or daytime variability or assoc excess/ purulent sputum or mucus plugs or chest tightness, subjective wheeze or overt sinus or hb symptoms. No unusual exposure hx or h/o childhood pna/ asthma or knowledge of premature birth.  Sleeping ok flat without nocturnal  or early am exacerbation  of respiratory  c/o's or need for noct saba. Also denies any obvious fluctuation of symptoms with weather or environmental changes or other aggravating or alleviating factors except as outlined above   Current Allergies, Complete Past Medical History, Past Surgical History, Family History, and Social History were reviewed in [employer] record.  ROS  The following are not active complaints unless bolded Hoarseness, sore throat, dysphagia, dental problems, itching, sneezing,  nasal congestion or discharge of excess mucus or purulent secretions, ear ache,   fever, chills, sweats, unintended wt loss or wt gain, classically pleuritic or exertional cp,  orthopnea pnd or leg swelling chronic but much worse L > R x 2 d pta , presyncope, palpitations, abdominal pain, anorexia, nausea, vomiting, diarrhea  or change in bowel habits or change in bladder habits, change in stools or change in urine, dysuria, hematuria,  rash, arthralgias, visual complaints, headache, numbness, weakness or ataxia or problems with walking or coordination,  change in  mood/affect or memory.        Current Meds  Medication Sig  . acetaminophen (TYLENOL) 325 MG tablet Take 650 mg by mouth every 6 (six) hours as needed for pain.  [name] amLODipine (NORVASC) 5 MG tablet Take 5 mg by mouth daily.   [name] aspirin EC 325 MG tablet Take 1 tablet (325 mg total) by mouth 2 (two) times daily after a meal. Take x 1 month post op to decrease risk of blood clots.  . cetirizine (ZYRTEC) 10 MG tablet Take 10 mg by mouth daily.  . Cholecalciferol (VITAMIN D3) 5000 units TABS Take 1 tablet by mouth daily.   [name] escitalopram (LEXAPRO) 10 MG tablet Take 10 mg by mouth daily.  . ferrous sulfate 325 (65 FE) MG tablet Take 325 mg by mouth daily with breakfast.  . fluticasone (FLONASE) 50 MCG/ACT nasal spray Place 1 spray into the nose daily as needed for allergies.   [name] HYDROcodone-acetaminophen (NORCO) 5-325 MG tablet Take 1-2 tablets by mouth every 6 (six) hours as needed for moderate pain.  . Lactobacillus (PROBIOTIC ACIDOPHILUS PO) Take 1 capsule by mouth daily.  [name] loperamide (IMODIUM) 2 MG capsule Take 2 mg by mouth as needed for diarrhea or loose stools.  . Menthol, Topical Analgesic, (BENGAY ULTRA STRENGTH EX) Apply 1 application topically as needed (hip pain).  [name] omeprazole (PRILOSEC) 20 MG capsule Take 20 mg by mouth daily.   [name] VITAMIN B1-B12 IJ Inject as directed every 30 (thirty) days.  . [DISCONTINUED] traMADol (ULTRAM) 50 MG tablet Take 50  mg by mouth every 6 (six) hours as needed for moderate pain or severe pain.       PAST MEDICAL HISTORY :  She  has a past medical history of Arthritis, Bladder absent (Pt had bladder removed in 2010), Bladder cancer ([place]), Breast cancer ([place]), Chronic kidney disease, Complication of anesthesia, GERD (gastroesophageal reflux disease), History of radiation therapy (05/02/16-06/07/16), Hypertension, Mitral valve prolapse, Personal history of radiation therapy (2005), PONV (postoperative nausea and vomiting), and Self-catheterizes urinary  bladder.  PAST SURGICAL HISTORY: She  has a past surgical history that includes Replacement total knee (Left); Abdominal hysterectomy (1980); Breast surgery (2005); Bladder surgery (2009); Cholecystectomy (N/A, 02/19/2013); Anterior and posterior repair (N/A, 01/25/2016); ir generic historical (05/03/2016); ir generic historical (05/03/2016); ir generic historical (05/31/2016); ir generic historical (05/31/2016); Breast lumpectomy (Left, 2005); Breast biopsy (Left, 2005); and Femur IM nail (Left, 06/21/2017).  No Known Allergies  No current facility-administered medications on file prior to encounter.    Current Outpatient Medications on File Prior to Encounter  Medication Sig  . acetaminophen (TYLENOL) 325 MG tablet Take 650 mg by mouth every 6 (six) hours as needed for pain.  [name] amLODipine (NORVASC) 5 MG tablet Take 5 mg by mouth daily.   [name] aspirin EC 325 MG tablet Take 1 tablet (325 mg total) by mouth 2 (two) times daily after a meal. Take x 1 month post op to decrease risk of blood clots.  . cetirizine (ZYRTEC) 10 MG tablet Take 10 mg by mouth daily.  . Cholecalciferol (VITAMIN D3) 5000 units TABS Take 1 tablet by mouth daily.   [name] escitalopram (LEXAPRO) 10 MG tablet Take 10 mg by mouth daily.  . ferrous sulfate 325 (65 FE) MG tablet Take 325 mg by mouth daily with breakfast.  . fluticasone (FLONASE) 50 MCG/ACT nasal spray Place 1 spray into the nose daily as needed for allergies.   [name] HYDROcodone-acetaminophen (NORCO) 5-325 MG tablet Take 1-2 tablets by mouth every 6 (six) hours as needed for moderate pain.  . Lactobacillus (PROBIOTIC ACIDOPHILUS PO) Take 1 capsule by mouth daily.  [name] loperamide (IMODIUM) 2 MG capsule Take 2 mg by mouth as needed for diarrhea or loose stools.  . Menthol, Topical Analgesic, (BENGAY ULTRA STRENGTH EX) Apply 1 application topically as needed (hip pain).  [name] omeprazole (PRILOSEC) 20 MG capsule Take 20 mg by mouth daily.   [name] VITAMIN B1-B12 IJ Inject as directed every 30  (thirty) days.  . furosemide (LASIX) 20 MG tablet Take 20 mg by mouth daily.  . prochlorperazine (COMPAZINE) 10 MG tablet Take 1 tablet (10 mg total) by mouth every 6 (six) hours as needed for nausea or vomiting. (Patient not taking: Reported on 07/07/2016)  . telmisartan (MICARDIS) 20 MG tablet Take 1 tablet (20 mg total) by mouth daily. (Patient not taking: Reported on 06/30/2017)    FAMILY HISTORY:  Her indicated that her mother is deceased. She indicated that her father is deceased. She indicated that the status of her sister is unknown.   SOCIAL HISTORY: She  reports that  has never smoked. she has never used smokeless tobacco. She reports that she does not drink alcohol or use drugs.     SUBJECTIVE:  Comfortable in recliner with legs up on RA  VITAL SIGNS: BP 127/65 (BP Location: Right Arm)   Pulse 90   Temp 98 F (36.7 C) (Oral)   Resp (!) 29   Ht 5\' 2"  (1.575 m)   Wt 177 lb 14.6 oz (80.7 kg)  SpO2 97%   BMI 32.54 kg/m   HEMODYNAMICS:    VENTILATOR SETTINGS:    INTAKE / OUTPUT: I/O last 3 completed shifts: In: 723 [P.O.:510; I.V.:53; Other:10; IV Piggyback:150] Out: 3325 [Urine:3325]  PHYSICAL EXAMINATION: Pleasant elderly wf nad    No jvd Oropharynx clear Neck supple Lungs with a few scattered exp > insp rhonchi bilaterally RRR no s3 or or sign murmur - slt increase P2  Abd   with nl insp excursion  Extr wam with no edema or clubbing noted and mild L >R edema s calf tenderness  Neuro  Alert/approp with nl sensorium and  No obvious motor deficits    LABS:  BMET Recent Labs  Lab 06/30/17 1741 07/02/17 0511  NA 142 139  K 3.4* 3.9  CL 109 106  CO2 20* 23  BUN 18 17  CREATININE 1.41* 1.30*  GLUCOSE 98 97    Electrolytes Recent Labs  Lab 06/30/17 1741 07/02/17 0511  CALCIUM 9.1 8.4*  MG 2.1  --     CBC Recent Labs  Lab 06/30/17 1741 07/01/17 0456 07/02/17 0511  WBC 9.3 8.4 7.6  HGB 9.3* 8.3* 8.6*  HCT 29.7* 26.0* 27.2*  PLT 458*  410* 443*    Coag's No results for input(s): APTT, INR in the last 168 hours.  Sepsis Markers No results for input(s): LATICACIDVEN, PROCALCITON, O2SATVEN in the last 168 hours.  ABG No results for input(s): PHART, PCO2ART, PO2ART in the last 168 hours.  Liver Enzymes Recent Labs  Lab 06/30/17 1741  AST 38  ALT 28  ALKPHOS 99  BILITOT 0.6  ALBUMIN 3.1*    Cardiac Enzymes No results for input(s): TROPONINI, PROBNP in the last 168 hours.  Glucose Recent Labs  Lab 07/01/17 1159 07/01/17 1614 07/02/17 1117  GLUCAP 80 103* 91    Imaging No results found.    Studies: CTa  06/30/17  1. Positive for acute PE with CTevidence of right heart strain (RV/LV Ratio = ) consistent with at least submassive (intermediate risk) PE. The presence of right heart strain has been associated with an increased risk of morbidity and mortality.  Venous dopplers 06/30/17 Pos Acute L DVT  Echo 07/01/17  - Right ventricle: The cavity size was normal. Wall thickness was   normal. Systolic function was normal. - Pulmonary arteries: Systolic pressure was moderately increased.   PA peak pressure: 50 mm Hg (S).  ASSESSMENT / PLAN:    1)   Moderate /severe PE provoked, with  clot burden resulting in  RV strain already improved on  Day #2 hep and ok to change over to DOAC  Comment:  There are 5 different presentations for PE from asympt to sudden death, and though there is significant PH with this one she presented with dyspnea/ not hemodynamic decompensation, and looks great today.  Therefore rec DOAC fine here Needs repeat echo in 3 months and rx for at least 6 months would only d/c it then if echo is no and repeat venous dopplers have normalized, otherwise continue for a full year and repeat the eval.  Dr Dagmar Hait is an excellent internist and can assume her care as outpt/ pulmoary f/u is prn     Christinia Gully, MD Pulmonary and [place]  [phone] After 5:30 PM or weekends, use Beeper [phone]

## 2017-07-02 NOTE — Progress Notes (Signed)
Foley leaking at times, Patient self caths at home, Dr. Aggie Moats Made aware orders received to Dc foley and patient to self cath as she does at home. Will monitor patient. Delsin Copen, Bettina Gavia RN

## 2017-07-03 DIAGNOSIS — I82409 Acute embolism and thrombosis of unspecified deep veins of unspecified lower extremity: Secondary | ICD-10-CM

## 2017-07-03 DIAGNOSIS — M1990 Unspecified osteoarthritis, unspecified site: Secondary | ICD-10-CM

## 2017-07-03 LAB — BASIC METABOLIC PANEL
ANION GAP: 14 (ref 5–15)
BUN: 24 mg/dL — ABNORMAL HIGH (ref 6–20)
CALCIUM: 8.7 mg/dL — AB (ref 8.9–10.3)
CO2: 21 mmol/L — ABNORMAL LOW (ref 22–32)
CREATININE: 1.38 mg/dL — AB (ref 0.44–1.00)
Chloride: 103 mmol/L (ref 101–111)
GFR, EST AFRICAN AMERICAN: 42 mL/min — AB (ref >60)
GFR, EST NON AFRICAN AMERICAN: 36 mL/min — AB (ref >60)
GLUCOSE: 104 mg/dL — AB (ref 65–99)
Potassium: 3.5 mmol/L (ref 3.5–5.1)
Sodium: 138 mmol/L (ref 135–145)

## 2017-07-03 LAB — CBC WITH DIFFERENTIAL/PLATELET
BASOS ABS: 0 10*3/uL (ref 0.0–0.1)
BASOS PCT: 0 %
EOS PCT: 3 %
Eosinophils Absolute: 0.2 10*3/uL (ref 0.0–0.7)
HCT: 27.9 % — ABNORMAL LOW (ref 36.0–46.0)
Hemoglobin: 8.6 g/dL — ABNORMAL LOW (ref 12.0–15.0)
Lymphocytes Relative: 16 %
Lymphs Abs: 1.2 10*3/uL (ref 0.7–4.0)
MCH: 30.6 pg (ref 26.0–34.0)
MCHC: 30.8 g/dL (ref 30.0–36.0)
MCV: 99.3 fL (ref 78.0–100.0)
Monocytes Absolute: 0.4 10*3/uL (ref 0.1–1.0)
Monocytes Relative: 6 %
Neutro Abs: 5.3 10*3/uL (ref 1.7–7.7)
Neutrophils Relative %: 75 %
PLATELETS: 455 10*3/uL — AB (ref 150–400)
RBC: 2.81 MIL/uL — ABNORMAL LOW (ref 3.87–5.11)
RDW: 16 % — AB (ref 11.5–15.5)
WBC: 7.1 10*3/uL (ref 4.0–10.5)

## 2017-07-03 LAB — GLUCOSE, CAPILLARY
Glucose-Capillary: 110 mg/dL — ABNORMAL HIGH (ref 65–99)
Glucose-Capillary: 73 mg/dL (ref 65–99)

## 2017-07-03 MED ORDER — RIVAROXABAN (XARELTO) VTE STARTER PACK (15 & 20 MG): ORAL_TABLET | ORAL | 0 refills | Status: DC

## 2017-07-03 MED ORDER — RIVAROXABAN (XARELTO) EDUCATION KIT FOR DVT/PE PATIENTS
PACK | Freq: Once | Status: AC
  Administered 2017-07-03: 13:00:00
  Filled 2017-07-03: qty 1

## 2017-07-03 MED ORDER — RIVAROXABAN (XARELTO) VTE STARTER PACK (15 & 20 MG): ORAL_TABLET | ORAL | 0 refills | Status: AC

## 2017-07-03 NOTE — Progress Notes (Signed)
Per insurance check for Xarelto   # 2.  S/W EBONY @ [facility] # [phone]    XARELTO  20 MG DAILY   COVER- YES  CO-PAY- $ 45.00  TIER- 3 DRUG  PRIOR APPROVAL- NO   PREFERRED PHARMACY : [facility]

## 2017-07-03 NOTE — Care Management Note (Signed)
Case Management Note Kristi Webster RN, BSN Unit 4E-Case Manager [phone]  Patient Details  Name: Caitlin [name] MRN: [id number] Date of Birth: 04/02/1941  Subjective/Objective:    Pt admitted with  Bil. PE and acute DVT               Action/Plan: PTA Pt lived at home was active with [facility] for HHPT- orders to resume HH services have been placed by MD. Notified Donna with [facility] for resumption of HH therapy services.  Pt for d/c later today- has been started on Xarelto- benefits check pending for copay cost.  Update- insurance check returned with a copay cost of $45- spoke with pt and spouse at bedside- coverage info shared- pt provided with Xarelto education kit that includes 30 day free card- call made to pt's pharmacy- and they do not have starter kit in stock to fill today- pt will need to go to [facility] on [address] to fill script for starter kit. Pt and spouse verbalized understanding - pt to f/u with her PCP for mail order script. Pt also informed that HH services have been resumed and [facility] will be calling to schedule her HH visits.   Expected Discharge Date:  07/03/17               Expected Discharge Plan:  Home w Home Health Services  In-House Referral:  NA  Discharge planning Services  CM Consult, Medication Assistance  Post Acute Care Choice:  Home Health, Resumption of Svcs/PTA Provider Choice offered to:  Patient  DME Arranged:    DME Agency:     HH Arranged:  PT, OT HH Agency:  [facility]  Status of Service:  Completed, signed off  If discussed at Long Length of Stay Meetings, dates discussed:    Discharge Disposition: home/home health  Additional Comments:  Webster, Kristi Hall, RN 07/03/2017, 11:31 AM  

## 2017-07-03 NOTE — Progress Notes (Signed)
Discussed with the patient and all questioned fully answered. She will call me if any problems arise.  IV removed. Telemetry removed, CCMD notified.  Pt given paper Rx for Xarelto starter pack - verbalized understanding of need to take to [facility] [address] - CM confirmed they have the starter pack available.   Fritz Pickerel, RN

## 2017-07-03 NOTE — Discharge Summary (Signed)
Physician Discharge Summary  Caitlin [name] MEQ:[id number] DOB: 12/06/1940 DOA: 06/30/2017  PCP: Prince Solian, MD  Admit date: 06/30/2017 Discharge date: 07/03/2017  Admitted From: Home Disposition: Home  Recommendations for Outpatient Follow-up:  1. Follow up with PCP in 1 week 2. Please obtain BMP/CBC in 1 week to ensure stability Hgb and Cr.   Home Health: PT   Equipment/Devices: None   Discharge Condition: Stable CODE STATUS: Full  Diet recommendation: Heart healthy   Brief/Interim Summary: Caitlin [name] is a 77 yo female who recently underwent left femur replace on Jun 20, 2017 who has been recovering at home. She now presents with 2 day history of shortness of breath, nausea, mild chest discomfort and some leg swelling.  In the emergency department, CT of the chest revealed bilateral PE with right heart strain.  Workup also revealed left DVT.  She was admitted for IV heparin.  Critical care was also consulted.  Echocardiogram revealed significant pulmonary hypertension, however, patient was not displaying any hemodynamic decompensation.  They recommended switching to direct oral anticoagulation and to repeat echocardiogram in 3 months.  They recommend that she be treated for at least 6 months, can consider discontinuing anticoagulation if echocardiogram and repeat venous Dopplers have normalized.  Otherwise they would recommend continuing anticoagulation for a full year and then repeat evaluation.  Discharge Diagnoses:  Principal Problem:   Pulmonary embolism (HCC) Active Problems:   HTN (hypertension)   Closed left subtrochanteric femur fracture (HCC)   Arthritis   DVT (deep venous thrombosis) [facility])  Discharge Instructions  Discharge Instructions    Call MD for:  difficulty breathing, headache or visual disturbances   Complete by:  As directed    Call MD for:  extreme fatigue   Complete by:  As directed    Call MD for:  persistant dizziness or light-headedness   Complete by:  As  directed    Call MD for:  persistant nausea and vomiting   Complete by:  As directed    Call MD for:  redness, tenderness, or signs of infection (pain, swelling, redness, odor or green/yellow discharge around incision site)   Complete by:  As directed    Call MD for:  severe uncontrolled pain   Complete by:  As directed    Call MD for:  temperature >100.4   Complete by:  As directed    Diet - low sodium heart healthy   Complete by:  As directed    Discharge instructions   Complete by:  As directed    You were cared for by a hospitalist during your hospital stay. If you have any questions about your discharge medications or the care you received while you were in the hospital after you are discharged, you can call the unit and asked to speak with the hospitalist on call if the hospitalist that took care of you is not available. Once you are discharged, your primary care physician will handle any further medical issues. Please note that NO REFILLS for any discharge medications will be authorized once you are discharged, as it is imperative that you return to your primary care physician (or establish a relationship with a primary care physician if you do not have one) for your aftercare needs so that they can reassess your need for medications and monitor your lab values.   Increase activity slowly   Complete by:  As directed      Allergies as of 07/03/2017   No Known Allergies  Medication List    STOP taking these medications   aspirin EC 325 MG tablet   telmisartan 20 MG tablet Commonly known as:  MICARDIS     TAKE these medications   acetaminophen 325 MG tablet Commonly known as:  TYLENOL Take 650 mg by mouth every 6 (six) hours as needed for pain.   amLODipine 5 MG tablet Commonly known as:  NORVASC Take 5 mg by mouth daily.   BENGAY ULTRA STRENGTH EX Apply 1 application topically as needed (hip pain).   cetirizine 10 MG tablet Commonly known as:  ZYRTEC Take 10 mg by  mouth daily.   escitalopram 10 MG tablet Commonly known as:  LEXAPRO Take 10 mg by mouth daily.   ferrous sulfate 325 (65 FE) MG tablet Take 325 mg by mouth daily with breakfast.   fluticasone 50 MCG/ACT nasal spray Commonly known as:  FLONASE Place 1 spray into the nose daily as needed for allergies.   furosemide 20 MG tablet Commonly known as:  LASIX Take 20 mg by mouth daily.   HYDROcodone-acetaminophen 5-325 MG tablet Commonly known as:  NORCO Take 1-2 tablets by mouth every 6 (six) hours as needed for moderate pain.   loperamide 2 MG capsule Commonly known as:  IMODIUM Take 2 mg by mouth as needed for diarrhea or loose stools.   omeprazole 20 MG capsule Commonly known as:  PRILOSEC Take 20 mg by mouth daily.   PROBIOTIC ACIDOPHILUS PO Take 1 capsule by mouth daily.   prochlorperazine 10 MG tablet Commonly known as:  COMPAZINE Take 1 tablet (10 mg total) by mouth every 6 (six) hours as needed for nausea or vomiting.   Rivaroxaban 15 & 20 MG Tbpk Start with one 15mg  tablet by mouth twice a day with food. On Day 22, switch to one 20mg  tablet once a day with food.   VITAMIN B1-B12 IJ Inject as directed every 30 (thirty) days.   Vitamin D3 5000 units Tabs Take 1 tablet by mouth daily.      Follow-up Information    Avva, Ravisankar, MD. Schedule an appointment as soon as possible for a visit in 1 week(s).   Specialty:  Internal Medicine Contact information: [address] [address] [place], [facility] Follow up.   Specialty:  Home Health Services Why:  [facility] services resumed (PT) Contact information: [address] [place] [address] [phone]          No Known Allergies  Consultations:  PCCM    Procedures/Studies: Dg Chest 2 View  Result Date: 06/30/2017 CLINICAL DATA:  Pt in c/o SOB on exertion onset yesterday, pt discharged last wk for fractured L femur, pt denies CP EXAM: CHEST  2  VIEW COMPARISON:  06/21/2017 FINDINGS: The heart is mildly enlarged. There are no focal consolidations or pleural effusions. No pulmonary edema. IMPRESSION: Stable, mild cardiomegaly. No evidence for acute pulmonary abnormality. Electronically Signed   By: Nolon Nations M.D.   On: 06/30/2017 18:21   Ct Angio Chest Pe W/cm &/or Wo Cm  Result Date: 06/30/2017 CLINICAL DATA:  C/o SOB onset yesterday, pt discharged last wk for fractured L femur,sp surgery EXAM: CT ANGIOGRAPHY CHEST WITH CONTRAST TECHNIQUE: Multidetector CT imaging of the chest was performed using the standard protocol during bolus administration of intravenous contrast. Multiplanar CT image reconstructions and MIPs were obtained to evaluate the vascular anatomy. CONTRAST:  152mL ISOVUE-370 IOPAMIDOL (ISOVUE-370) INJECTION 76% COMPARISON:  Chest  x-ray 06/30/2017 and chest CT 03/08/2016 FINDINGS: Cardiovascular: There are numerous pulmonary emboli involving upper and lower lobe pulmonary arteries bilaterally. RV: LV ratio is 1.2, compatible with right heart strain. Heart size is normal. No significant pericardial effusion. There are coronary artery calcifications. There is atherosclerotic calcification of the thoracic aorta not associated with aneurysm. Mediastinum/Nodes: The visualized portion of the thyroid gland has a normal appearance. No mediastinal, hilar, or axillary adenopathy. There is a hiatal hernia. Lungs/Pleura: There are patchy pleural based lung opacities at the apices, right greater than left and stable compared with prior CT exam. No pleural effusions. No suspicious pulmonary nodules. There are dependent changes in the lungs. Upper Abdomen: No acute abnormality. Musculoskeletal: No chest wall abnormality. No acute or significant osseous findings. Review of the MIP images confirms the above findings. IMPRESSION: 1. Positive for acute PE with CTevidence of right heart strain (RV/LV Ratio = ) consistent with at least submassive  (intermediate risk) PE. The presence of right heart strain has been associated with an increased risk of morbidity and mortality. 2. Coronary artery disease. 3.  Aortic atherosclerosis.  (ICD10-I70.0) Critical Value/emergent results were called by telephone at the time of interpretation on 06/30/2017 at 8:04 pm to Dr. Threasa Beards BELFI , who verbally acknowledged these results. Electronically Signed   By: Nolon Nations M.D.   On: 06/30/2017 20:10   Dg Chest Portable 1 View  Result Date: 06/21/2017 CLINICAL DATA:  Post fall.  Preoperative examination. EXAM: PORTABLE CHEST 1 VIEW COMPARISON:  01/29/2016; chest CT - 03/08/2016 FINDINGS: Grossly unchanged cardiac silhouette and mediastinal contours. Mild diffuse slightly nodular thickening of the pulmonary interstitium, unchanged. No discrete focal airspace opacities. No pleural effusion or pneumothorax. Left apical pleural calcifications are unchanged. No acute osseus abnormalities. Post cholecystectomy. IMPRESSION: No acute cardiopulmonary disease. Electronically Signed   By: Sandi Mariscal M.D.   On: 06/21/2017 20:42   Dg Knee Left Port  Result Date: 06/21/2017 CLINICAL DATA:  Post fall.  History of left knee replacement. EXAM: PORTABLE LEFT KNEE - 1-2 VIEW COMPARISON:  None. FINDINGS: No definite fracture or dislocation. Post left total knee replacement without evidence of hardware failure loosening. No knee joint effusion. Regional soft tissues appear normal. IMPRESSION: 1. No fracture or dislocation. 2. Post left total knee replacement without evidence of hardware failure or loosening. Electronically Signed   By: Sandi Mariscal M.D.   On: 06/21/2017 20:39   Dg C-arm 1-60 Min  Result Date: 06/22/2017 CLINICAL DATA:  Femur fracture EXAM: LEFT FEMUR 2 VIEWS; DG C-ARM 61-120 MIN COMPARISON:  06/21/2017 FINDINGS: Eighteen low resolution intraoperative spot views of the left femur. Total fluoroscopy time was 4 minutes 24 seconds. Initial image demonstrates an  angulated subtrochanteric fracture. Subsequent images demonstrate intramedullary rod fixation of the left femur. A left knee replacement is noted. IMPRESSION: Intraoperative fluoroscopic assistance provided during surgical fixation of left subtrochanteric fracture Electronically Signed   By: Donavan Foil M.D.   On: 06/22/2017 01:24   Dg Hip Port Unilat W Or Wo Pelvis 1 View Left  Result Date: 06/21/2017 CLINICAL DATA:  LEFT hip pain after fall at church. History of bladder cancer, breast cancer. EXAM: DG HIP (WITH OR WITHOUT PELVIS) 1V PORT LEFT COMPARISON:  None. FINDINGS: Acute oblique fracture through proximal LEFT femur with varus angulation distal bony fragment, anteriorly directed fracture apex. Thickened lateral cortex with softening of the fracture line. Focally thickened contralateral femur cortex without fracture line. Femoral heads are located. No dislocation. Soft tissue planes are  not suspicious. IMPRESSION: Acute displaced proximal LEFT femur fracture, potentially related to bisphosphonate use. Recommend correlation with medication use. Focal cortical thickening contralateral femur, possibly related to bisphosphonate use. Electronically Signed   By: Elon Alas M.D.   On: 06/21/2017 20:44   Dg Femur Min 2 Views Left  Result Date: 06/22/2017 CLINICAL DATA:  Femur fracture EXAM: LEFT FEMUR 2 VIEWS; DG C-ARM 61-120 MIN COMPARISON:  06/21/2017 FINDINGS: Eighteen low resolution intraoperative spot views of the left femur. Total fluoroscopy time was 4 minutes 24 seconds. Initial image demonstrates an angulated subtrochanteric fracture. Subsequent images demonstrate intramedullary rod fixation of the left femur. A left knee replacement is noted. IMPRESSION: Intraoperative fluoroscopic assistance provided during surgical fixation of left subtrochanteric fracture Electronically Signed   By: Donavan Foil M.D.   On: 06/22/2017 01:24   Vas [place] Lower Extremity Venous (dvt)  Result Date:  07/02/2017  Lower Venous Study Indication: SOB. Risk Factors: Confirmed PE. Bladder cancer. Neobladder.  Examination Guidelines: A complete evaluation includes B-mode imaging, spectral doppler, color doppler, and power doppler as needed of all accessible portions of each vessel. Bilateral testing is considered an integral part of a complete examination. Limited examinations for reoccurring indications may be performed as noted. Prior exam from 02/27/17 is available for comparison.  Right Venous Findings: +---------+---------------+---------+-----------+----------+-------+          CompressibilityPhasicitySpontaneityPropertiesSummary +---------+---------------+---------+-----------+----------+-------+ CFV      None           No       No                           +---------+---------------+---------+-----------+----------+-------+ FV Prox  Partial        No       No                           +---------+---------------+---------+-----------+----------+-------+ FV Mid   Full                                                 +---------+---------------+---------+-----------+----------+-------+ FV DistalFull                                                 +---------+---------------+---------+-----------+----------+-------+ PFV      Partial                                              +---------+---------------+---------+-----------+----------+-------+ POP      Full           Yes      Yes                          +---------+---------------+---------+-----------+----------+-------+ PTV      Full                                                 +---------+---------------+---------+-----------+----------+-------+ PERO  Full                                                 +---------+---------------+---------+-----------+----------+-------+ GSV      None                                                  +---------+---------------+---------+-----------+----------+-------+  Left Venous Findings: +---------+---------------+---------+-----------+----------+-----------------+          CompressibilityPhasicitySpontaneityPropertiesSummary           +---------+---------------+---------+-----------+----------+-----------------+ CFV      Full           Yes      Yes                                    +---------+---------------+---------+-----------+----------+-----------------+ FV Prox  Full                                                           +---------+---------------+---------+-----------+----------+-----------------+ FV Mid   Full                                                           +---------+---------------+---------+-----------+----------+-----------------+ FV DistalFull                                                           +---------+---------------+---------+-----------+----------+-----------------+ PFV      Full                                                           +---------+---------------+---------+-----------+----------+-----------------+ POP      Partial        Yes      Yes                  Age Indeterminate +---------+---------------+---------+-----------+----------+-----------------+ PTV      None                                         Acute             +---------+---------------+---------+-----------+----------+-----------------+ PERO     None                                         Acute             +---------+---------------+---------+-----------+----------+-----------------+  GSV      Full                                                           +---------+---------------+---------+-----------+----------+-----------------+    Final Interpretation: Right: Sluggish flow noted in the CFV, GSV, and proximal femoral vein, with no compression, possibly secondary to a more proximal compression from neobladder. Left: There is  evidence of acute DVT in the Popliteal vein, Posterior Tibial veins, and Peroneal veins.  *See table(s) above for measurements and observations. Electronically signed by Curt Jews on 07/02/2017 at 9:08:06 AM.     Echo Study Conclusions  - Left ventricle: The cavity size was normal. There was mild   concentric hypertrophy. Systolic function was normal. Wall motion   was normal; there were no regional wall motion abnormalities.   Doppler parameters are consistent with abnormal left ventricular   relaxation (grade 1 diastolic dysfunction). Doppler parameters   are consistent with indeterminate ventricular filling pressure. - Aortic valve: Transvalvular velocity was within the normal range.   There was no stenosis. There was no regurgitation. - Mitral valve: Transvalvular velocity was within the normal range.   There was no evidence for stenosis. There was trivial   regurgitation. - Right ventricle: The cavity size was normal. Wall thickness was   normal. Systolic function was normal. - Pulmonary arteries: Systolic pressure was moderately increased.   PA peak pressure: 50 mm Hg (S). - Pericardium, extracardiac: A small pericardial effusion was   identified.   Vascular US LE Final Interpretation: Right: Sluggish flow noted in the CFV, GSV, and proximal femoral vein, with no compression, possibly secondary to a more proximal compression from neobladder. Left: There is evidence of acute DVT in the Popliteal vein, Posterior Tibial veins, and Peroneal veins.     Discharge Exam: Vitals:   07/02/17 2009 07/03/17 0410  BP: 136/60 134/65  Pulse:  88  Resp: (!) 27 18  Temp: 98.2 F (36.8 C) (!) 97.3 F (36.3 C)  SpO2:     Vitals:   07/02/17 1120 07/02/17 1632 07/02/17 2009 07/03/17 0410  BP: 127/65 127/61 136/60 134/65  Pulse: 90 84  88  Resp: (!) 29 17 (!) 27 18  Temp: 98 F (36.7 C) 98 F (36.7 C) 98.2 F (36.8 C) (!) 97.3 F (36.3 C)  TempSrc: Oral Oral Oral Oral  SpO2:  97% 98%    Weight:      Height:        General: Pt is alert, awake, not in acute distress Cardiovascular: RRR, S1/S2 +, no rubs, no gallops Respiratory: CTA bilaterally, no wheezing, no rhonchi Abdominal: Soft, NT, ND, bowel sounds + Extremities: +trace edema LLE, TTP left calf, no cyanosis    The results of significant diagnostics from this hospitalization (including imaging, microbiology, ancillary and laboratory) are listed below for reference.     Microbiology: No results found for this or any previous visit (from the past 240 hour(s)).   Labs: BNP (last 3 results) Recent Labs    06/30/17 1920  BNP 175.1*   Basic Metabolic Panel: Recent Labs  Lab 06/30/17 1741 07/02/17 0511 07/03/17 0420  NA 142 139 138  K 3.4* 3.9 3.5  CL 109 106 103  CO2 20* 23 21*  GLUCOSE 98 97 104*  BUN 18 17  24*  CREATININE 1.41* 1.30* 1.38*  CALCIUM 9.1 8.4* 8.7*  MG 2.1  --   --    Liver Function Tests: Recent Labs  Lab 06/30/17 1741  AST 38  ALT 28  ALKPHOS 99  BILITOT 0.6  PROT 6.9  ALBUMIN 3.1*   No results for input(s): LIPASE, AMYLASE in the last 168 hours. No results for input(s): AMMONIA in the last 168 hours. CBC: Recent Labs  Lab 06/30/17 1741 07/01/17 0456 07/02/17 0511 07/03/17 0420  WBC 9.3 8.4 7.6 7.1  NEUTROABS 7.1  --   --  5.3  HGB 9.3* 8.3* 8.6* 8.6*  HCT 29.7* 26.0* 27.2* 27.9*  MCV 97.4 96.3 98.2 99.3  PLT 458* 410* 443* 455*   Cardiac Enzymes: No results for input(s): CKTOTAL, CKMB, CKMBINDEX, TROPONINI in the last 168 hours. BNP: Invalid input(s): POCBNP CBG: Recent Labs  Lab 07/02/17 1117 07/02/17 1628 07/02/17 2144 07/03/17 0612 07/03/17 1105  GLUCAP 91 93 112* 110* 73   D-Dimer No results for input(s): DDIMER in the last 72 hours. Hgb A1c No results for input(s): HGBA1C in the last 72 hours. Lipid Profile No results for input(s): CHOL, HDL, LDLCALC, TRIG, CHOLHDL, LDLDIRECT in the last 72 hours. Thyroid function studies No  results for input(s): TSH, T4TOTAL, T3FREE, THYROIDAB in the last 72 hours.  Invalid input(s): FREET3 Anemia work up No results for input(s): VITAMINB12, FOLATE, FERRITIN, TIBC, IRON, RETICCTPCT in the last 72 hours. Urinalysis    Component Value Date/Time   COLORURINE YELLOW 10/10/2012 1541   APPEARANCEUR CLOUDY (A) 10/10/2012 1541   LABSPEC 1.008 10/10/2012 1541   PHURINE 6.0 10/10/2012 1541   GLUCOSEU NEGATIVE 10/10/2012 1541   HGBUR MODERATE (A) 10/10/2012 1541   BILIRUBINUR NEGATIVE 10/10/2012 1541   KETONESUR NEGATIVE 10/10/2012 1541   PROTEINUR NEGATIVE 10/10/2012 1541   UROBILINOGEN 0.2 10/10/2012 1541   NITRITE POSITIVE (A) 10/10/2012 1541   LEUKOCYTESUR LARGE (A) 10/10/2012 1541   Sepsis Labs Invalid input(s): PROCALCITONIN,  WBC,  LACTICIDVEN Microbiology No results found for this or any previous visit (from the past 240 hour(s)).   Time coordinating discharge: 40 minutes  SIGNED:  Dessa Phi, DO [facility] Pager [phone]  If 7PM-7AM, please contact night-coverage [email or web address] Password [email or web address] 07/03/2017, 12:15 PM

## 2017-07-03 NOTE — Progress Notes (Signed)
[facility]  Patient Status: Active (receiving services up to time of hospitalization)  [facility] is providing the following services: PT  If patient discharges after hours, please call [phone].   Janae Sauce 07/03/2017, 10:04 AM

## 2017-09-25 ENCOUNTER — Ambulatory Visit
Admission: RE | Admit: 2017-09-25 | Discharge: 2017-09-25 | Disposition: A | Discharge status: Home / Self Care | Payer: Medicare Other | Source: Ambulatory Visit | Attending: Radiation Oncology | Admitting: Radiation Oncology

## 2017-09-25 DIAGNOSIS — C679 Malignant neoplasm of bladder, unspecified: Secondary | ICD-10-CM

## 2017-10-02 ENCOUNTER — Other Ambulatory Visit (HOSPITAL_COMMUNITY): Payer: Self-pay | Admitting: Internal Medicine

## 2017-10-02 ENCOUNTER — Ambulatory Visit
Admission: RE | Admit: 2017-10-02 | Discharge: 2017-10-02 | Disposition: A | Discharge status: Home / Self Care | Payer: Medicare Other | Source: Ambulatory Visit | Attending: Radiation Oncology | Admitting: Radiation Oncology

## 2017-10-02 ENCOUNTER — Other Ambulatory Visit: Payer: Self-pay

## 2017-10-02 ENCOUNTER — Encounter: Payer: Self-pay | Admitting: Radiation Oncology

## 2017-10-02 VITALS — BP 144/70 | HR 77 | Temp 98.1°F | Ht 62.0 in | Wt 165.4 lb

## 2017-10-02 DIAGNOSIS — Z86718 Personal history of other venous thrombosis and embolism: Secondary | ICD-10-CM | POA: Diagnosis not present

## 2017-10-02 DIAGNOSIS — I2721 Secondary pulmonary arterial hypertension: Secondary | ICD-10-CM

## 2017-10-02 DIAGNOSIS — C774 Secondary and unspecified malignant neoplasm of inguinal and lower limb lymph nodes: Secondary | ICD-10-CM | POA: Insufficient documentation

## 2017-10-02 DIAGNOSIS — Z86711 Personal history of pulmonary embolism: Secondary | ICD-10-CM | POA: Diagnosis not present

## 2017-10-02 DIAGNOSIS — Z79899 Other long term (current) drug therapy: Secondary | ICD-10-CM | POA: Diagnosis not present

## 2017-10-02 DIAGNOSIS — Z923 Personal history of irradiation: Secondary | ICD-10-CM | POA: Diagnosis not present

## 2017-10-02 DIAGNOSIS — C679 Malignant neoplasm of bladder, unspecified: Secondary | ICD-10-CM

## 2017-10-02 DIAGNOSIS — R197 Diarrhea, unspecified: Secondary | ICD-10-CM | POA: Insufficient documentation

## 2017-10-02 DIAGNOSIS — I2699 Other pulmonary embolism without acute cor pulmonale: Secondary | ICD-10-CM

## 2017-10-02 NOTE — Progress Notes (Signed)
Patient denies any pain. States that she has mild fatigue. Denies any dysuria. Patient self cath herself  Every 2-2.5hours. States that she has some leakage. Denies any hematuria. States that she has diarrhea,but it due to having her gallbladder out. States that she take imodium .Denies any vaginal discharge. Denies any rectal bleeding. Denies any nausea or vomiting.Denies any skin irritation where she had radiation. Vitals:   10/02/17 1531  BP: (!) 144/70  Pulse: 77  Temp: 98.1 F (36.7 C)  TempSrc: Oral  SpO2: 100%  Weight: 165 lb 6.4 oz (75 kg)  Height: 5\' 2"  (1.575 m)   Wt Readings from Last 3 Encounters:  10/02/17 165 lb 6.4 oz (75 kg)  06/30/17 177 lb 14.6 oz (80.7 kg)  06/06/17 167 lb 1.6 oz (75.8 kg)

## 2017-10-02 NOTE — Progress Notes (Signed)
Radiation Oncology         [phone] ________________________________  Name: Caitlin [name] MRN: [id number]  Date: 10/02/2017  DOB: 11-Jun-1940  Follow-Up Visit Note  CC: Prince Solian, MD  Ladell Pier, MD    ICD-10-CM   1. 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  In January she suffered a femoral fracture from a fall that intramedullary nail performed on 06/21/2017 by Dr. Berenice Primas. Following surgery, she experienced a DVT and pulmonary emboli and is now on Eliquis. She also noticed hematuria associated with CIC that started about a week after surgery. Urine culture on 07/19/2017 showed mixed growth and she was treated with Keflex but hematuria persisted despite treatment. She was further evaluated with a CT scan of the abdomen/pelvis on 08/25/2017 which did not identify cause for hematuria. No obvious recurrent malignancy along the remaining urothelium. No adenopathy noted. She reports undergoing cystoscopy earlier this year by Dr. Vernie Shanks with no evidence of recurrence  On review of systems, the patient reports mild fatigue. She denies any dysuria or hematuria. She self-caths herself every 2-2.5 hours. She reports some leakage. She reports diarrhea, but states that it is due to having her gallbladder out. She takes Imodium. She denies any vaginal discharge. She denies any rectal bleeding. She denies any nausea or vomiting. She denies any skin irritation where she had radiation.                     ALLERGIES:  has No Known Allergies.  Meds: Current Outpatient Medications  Medication Sig Dispense Refill  . acetaminophen (TYLENOL) 325 MG tablet Take 650 mg by mouth every 6 (six) hours as needed for pain.    [name] amLODipine (NORVASC) 5 MG tablet Take 5 mg by mouth daily.     . Cholecalciferol (VITAMIN D3) 5000 units TABS Take 1 tablet by mouth daily.     . fluticasone (FLONASE) 50 MCG/ACT nasal spray Place 1 spray into the nose daily as needed for allergies.     . furosemide (LASIX) 20 MG tablet Take 20 mg by mouth daily.  0  . HYDROcodone-acetaminophen (NORCO) 5-325 MG tablet Take 1-2 tablets by mouth every 6 (six) hours as needed for moderate pain. 50 tablet 0  . loperamide (IMODIUM) 2 MG capsule Take 2 mg by mouth as needed for diarrhea or loose stools.    . Menthol, Topical Analgesic, (BENGAY ULTRA STRENGTH EX) Apply 1 application topically as needed (hip pain).    [name] omeprazole (PRILOSEC) 20 MG capsule Take 20 mg by mouth daily.     . potassium chloride (K-DUR) 10 MEQ tablet   3  . tiZANidine (ZANAFLEX) 2 MG tablet   0  . VITAMIN B1-B12 IJ Inject as directed every 30 (thirty) days.    . cetirizine (ZYRTEC) 10 MG tablet Take 10 mg by mouth daily.    [name] escitalopram (LEXAPRO) 10 MG tablet Take 10 mg by mouth daily.    . ferrous sulfate 325 (65 FE) MG tablet Take 325 mg by mouth  daily with breakfast.    . Lactobacillus (PROBIOTIC ACIDOPHILUS PO) Take 1 capsule by mouth daily.    . prochlorperazine (COMPAZINE) 10 MG tablet Take 1 tablet (10 mg total) by mouth every 6 (six) hours as needed for nausea or vomiting. (Patient not taking: Reported on 07/07/2016) 30 tablet 0  . Rivaroxaban 15 & 20 MG TBPK Start with one 15mg  tablet by mouth twice a day with food. On Day 22, switch to one 20mg  tablet once a day with food. (Patient not taking: Reported on 10/02/2017) 51 each 0   No current facility-administered medications for this encounter.     Physical Findings: The patient is in no acute  distress. Patient is alert and oriented.  height is 5\' 2"  (1.575 m) and weight is 165 lb 6.4 oz (75 kg). Her oral temperature is 98.1 F (36.7 C). Her blood pressure is 144/70 (abnormal) and her pulse is 77. Her oxygen saturation is 100%.   Lungs are clear to auscultation bilaterally. Heart has regular rate and rhythm. No palpable cervical, supraclavicular, or axillary adenopathy. Abdomen soft, non-tender, normal bowel sounds.  On pelvic examination the external genitalia were unremarkable. A speculum exam was performed. There are no mucosal lesions noted in the vaginal vault. On bimanual and rectovaginal examination there were no pelvic masses appreciated. No inguinal adenopathy.  Lab Findings: Lab Results  Component Value Date   WBC 7.1 07/03/2017   HGB 8.6 (L) 07/03/2017   HCT 27.9 (L) 07/03/2017   MCV 99.3 07/03/2017   PLT 455 (H) 07/03/2017    Radiographic Findings: No results found.  Impression:  No evidence of recurrence on clinical exam.   Plan:  Follow up in 6 months.  Patient will meet with Dr. Benay Spice the summer.   ____________________________________  Blair Promise, PhD, MD  This document serves as a record of services personally performed by Gery Pray, MD. It was created on his behalf by Rae Lips, a trained medical scribe. The creation of this record is based on the scribe's personal observations and the provider's statements to them. This document has been checked and approved by the attending provider.

## 2017-10-03 ENCOUNTER — Telehealth: Payer: Self-pay | Admitting: *Deleted

## 2017-10-03 NOTE — Telephone Encounter (Signed)
CALLED PATIENT TO INFORM OF FU APPT. WITH DR. KINARD ON 04-05-18 @ 11:30 AM , LVM FOR A RETURN CALL

## 2017-10-04 ENCOUNTER — Ambulatory Visit (HOSPITAL_COMMUNITY): Payer: Medicare Other | Attending: Cardiology

## 2017-10-04 ENCOUNTER — Other Ambulatory Visit: Payer: Self-pay

## 2017-10-04 DIAGNOSIS — Z8551 Personal history of malignant neoplasm of bladder: Secondary | ICD-10-CM | POA: Diagnosis not present

## 2017-10-04 DIAGNOSIS — I340 Nonrheumatic mitral (valve) insufficiency: Secondary | ICD-10-CM | POA: Insufficient documentation

## 2017-10-04 DIAGNOSIS — I2699 Other pulmonary embolism without acute cor pulmonale: Secondary | ICD-10-CM | POA: Diagnosis not present

## 2017-10-04 DIAGNOSIS — I2721 Secondary pulmonary arterial hypertension: Secondary | ICD-10-CM | POA: Insufficient documentation

## 2017-10-04 DIAGNOSIS — I491 Atrial premature depolarization: Secondary | ICD-10-CM | POA: Insufficient documentation

## 2017-10-04 DIAGNOSIS — I119 Hypertensive heart disease without heart failure: Secondary | ICD-10-CM | POA: Insufficient documentation

## 2017-11-23 ENCOUNTER — Other Ambulatory Visit: Payer: Self-pay | Admitting: Orthopedic Surgery

## 2017-12-01 ENCOUNTER — Other Ambulatory Visit: Payer: Self-pay | Admitting: Internal Medicine

## 2017-12-01 DIAGNOSIS — Z1231 Encounter for screening mammogram for malignant neoplasm of breast: Secondary | ICD-10-CM

## 2017-12-04 ENCOUNTER — Inpatient Hospital Stay: Payer: Medicare Other | Admitting: Oncology

## 2017-12-04 NOTE — Pre-Procedure Instructions (Signed)
Caitlin [name]  12/04/2017    Your procedure is scheduled on Friday, December 08, 2017 at 1:00 PM.   Report to [facility] Entrance "A" Admitting Office at 11:00 AM.   Call this number if you have problems the morning of surgery: [phone]   Questions prior to day of surgery, please call [phone] between 8 & 4 PM.   Remember:  Do not eat or drink after midnight Thursday, 12/07/17.  Take these medicines the morning of surgery with A SIP OF WATER: Cetirizine (Zyrtec), Escitalopram (Lexapro), Omeprazole (Prilosec), Tizanidine (Zanaflex), Tylenol, Hydrocodone - if needed.  Stop Xarelto as instructed by physician/surgeon. Do not use NSAIDS (Ibuprofen, Aleve, etc) or Aspirin products prior to surgery.    Do not wear jewelry, make-up or nail polish.  Do not wear lotions, powders, perfumes or deodorant.  Do not shave 48 hours prior to surgery.    Do not bring valuables to the hospital.  [facility] is not responsible for any belongings or valuables.  Contacts, dentures or bridgework may not be worn into surgery.  Leave your suitcase in the car.  After surgery it may be brought to your room.  For patients admitted to the hospital, discharge time will be determined by your treatment team.  [facility] - Preparing for Surgery  Before surgery, you can play an important role.  Because skin is not sterile, your skin needs to be as free of germs as possible.  You can reduce the number of germs on you skin by washing with CHG (chlorahexidine gluconate) soap before surgery.  CHG is an antiseptic cleaner which kills germs and bonds with the skin to continue killing germs even after washing.  Oral Hygiene is also important in reducing the risk of infection.  Remember to brush your teeth with your regular toothpaste the morning of surgery.  Please DO NOT use if you have an allergy to CHG or antibacterial soaps.  If your skin becomes reddened/irritated stop using the CHG and inform your nurse when  you arrive at Short Stay.  Do not shave (including legs and underarms) for at least 48 hours prior to the first CHG shower.  You may shave your face.  Please follow these instructions carefully:   1.  Shower with CHG Soap the night before surgery and the morning of Surgery.  2.  If you choose to wash your hair, wash your hair first as usual with your normal shampoo.  3.  After you shampoo, rinse your hair and body thoroughly to remove the shampoo. 4.  Use CHG as you would any other liquid soap.  You can apply chg directly to the skin and wash gently with a      scrungie or washcloth.           5.  Apply the CHG Soap to your body ONLY FROM THE NECK DOWN.   Do not use on open wounds or open sores. Avoid contact with your eyes, ears, mouth and genitals (private parts).  Wash genitals (private parts) with your normal soap.  6.  Wash thoroughly, paying special attention to the area where your surgery will be performed.  7.  Thoroughly rinse your body with warm water from the neck down.  8.  DO NOT shower/wash with your normal soap after using and rinsing off the CHG Soap.  9.  Pat yourself dry with a clean towel.            10.  Wear clean  pajamas.            11.  Place clean sheets on your bed the night of your first shower and do not sleep with pets.  Day of Surgery  Shower as above. Do not apply any lotions/deodorants the morning of surgery.   Please wear clean clothes to the hospital. Remember to brush your teeth with toothpaste.   Please read over the fact sheets that you were given.

## 2017-12-05 ENCOUNTER — Encounter (HOSPITAL_COMMUNITY)
Admission: RE | Admit: 2017-12-05 | Discharge: 2017-12-05 | Disposition: A | Discharge status: Home / Self Care | Payer: Medicare Other | Source: Ambulatory Visit | Attending: Orthopedic Surgery | Admitting: Orthopedic Surgery

## 2017-12-05 ENCOUNTER — Encounter (HOSPITAL_COMMUNITY): Payer: Self-pay

## 2017-12-05 ENCOUNTER — Other Ambulatory Visit: Payer: Self-pay

## 2017-12-05 DIAGNOSIS — Z79899 Other long term (current) drug therapy: Secondary | ICD-10-CM

## 2017-12-05 DIAGNOSIS — N184 Chronic kidney disease, stage 4 (severe): Secondary | ICD-10-CM | POA: Insufficient documentation

## 2017-12-05 DIAGNOSIS — Z86711 Personal history of pulmonary embolism: Secondary | ICD-10-CM | POA: Insufficient documentation

## 2017-12-05 DIAGNOSIS — I129 Hypertensive chronic kidney disease with stage 1 through stage 4 chronic kidney disease, or unspecified chronic kidney disease: Secondary | ICD-10-CM

## 2017-12-05 DIAGNOSIS — Z01812 Encounter for preprocedural laboratory examination: Secondary | ICD-10-CM

## 2017-12-05 DIAGNOSIS — N390 Urinary tract infection, site not specified: Secondary | ICD-10-CM | POA: Insufficient documentation

## 2017-12-05 DIAGNOSIS — K219 Gastro-esophageal reflux disease without esophagitis: Secondary | ICD-10-CM | POA: Insufficient documentation

## 2017-12-05 HISTORY — DX: Other pulmonary embolism without acute cor pulmonale: I26.99

## 2017-12-05 HISTORY — DX: Personal history of other diseases of the digestive system: Z87.19

## 2017-12-05 HISTORY — DX: Anemia, unspecified: D64.9

## 2017-12-05 HISTORY — DX: Restless legs syndrome: G25.81

## 2017-12-05 HISTORY — DX: Irritable bowel syndrome, unspecified: K58.9

## 2017-12-05 LAB — URINALYSIS, ROUTINE W REFLEX MICROSCOPIC
Bilirubin Urine: NEGATIVE
GLUCOSE, UA: NEGATIVE mg/dL
KETONES UR: NEGATIVE mg/dL
NITRITE: POSITIVE — AB
PH: 5 (ref 5.0–8.0)
Protein, ur: 30 mg/dL — AB
SPECIFIC GRAVITY, URINE: 1.01 (ref 1.005–1.030)

## 2017-12-05 LAB — CBC WITH DIFFERENTIAL/PLATELET
ABS IMMATURE GRANULOCYTES: 0.1 10*3/uL (ref 0.0–0.1)
BASOS ABS: 0 10*3/uL (ref 0.0–0.1)
BASOS PCT: 0 %
Eosinophils Absolute: 0.2 10*3/uL (ref 0.0–0.7)
Eosinophils Relative: 2 %
HCT: 36 % (ref 36.0–46.0)
Hemoglobin: 10.6 g/dL — ABNORMAL LOW (ref 12.0–15.0)
IMMATURE GRANULOCYTES: 1 %
Lymphocytes Relative: 17 %
Lymphs Abs: 1.5 10*3/uL (ref 0.7–4.0)
MCH: 29.7 pg (ref 26.0–34.0)
MCHC: 29.4 g/dL — ABNORMAL LOW (ref 30.0–36.0)
MCV: 100.8 fL — ABNORMAL HIGH (ref 78.0–100.0)
MONOS PCT: 6 %
Monocytes Absolute: 0.6 10*3/uL (ref 0.1–1.0)
NEUTROS ABS: 6.5 10*3/uL (ref 1.7–7.7)
NEUTROS PCT: 74 %
PLATELETS: 481 10*3/uL — AB (ref 150–400)
RBC: 3.57 MIL/uL — AB (ref 3.87–5.11)
RDW: 14.8 % (ref 11.5–15.5)
WBC: 8.8 10*3/uL (ref 4.0–10.5)

## 2017-12-05 LAB — BASIC METABOLIC PANEL
ANION GAP: 10 (ref 5–15)
BUN: 27 mg/dL — ABNORMAL HIGH (ref 8–23)
CO2: 22 mmol/L (ref 22–32)
Calcium: 9.5 mg/dL (ref 8.9–10.3)
Chloride: 105 mmol/L (ref 98–111)
Creatinine, Ser: 1.85 mg/dL — ABNORMAL HIGH (ref 0.44–1.00)
GFR, EST AFRICAN AMERICAN: 29 mL/min — AB (ref >60)
GFR, EST NON AFRICAN AMERICAN: 25 mL/min — AB (ref >60)
Glucose, Bld: 105 mg/dL — ABNORMAL HIGH (ref 70–99)
Potassium: 4.7 mmol/L (ref 3.5–5.1)
SODIUM: 137 mmol/L (ref 135–145)

## 2017-12-05 LAB — APTT: aPTT: 64 seconds — ABNORMAL HIGH (ref 24–36)

## 2017-12-05 LAB — PROTIME-INR
INR: 2.9
PROTHROMBIN TIME: 30.1 s — AB (ref 11.4–15.2)

## 2017-12-05 NOTE — Progress Notes (Signed)
Notified Elmyra Ricks at Dr. Berenice Primas' office of abnormal urinalysis.

## 2017-12-05 NOTE — Progress Notes (Signed)
Pt states she has a hx of Mital Valve Prolapse and sees Dr. Acie Fredrickson. Last visit was in January, 2018 and was told to follow up as needed. Pt had surgery in January, 2019 for a femur fracture and she had 2 blood clots to her lungs (1 in each lung). She is on Xarelto for that. Has been instructed to stop it 2 days prior to surgery. Last dose will be 12/06/17 in the AM.

## 2017-12-06 NOTE — Progress Notes (Signed)
Anesthesia Chart Review:   Case:  [id number] Date/Time:  12/08/17 1245   Procedure:  INTRAMEDULLARY (IM) RETROGRADE FEMORAL NAILING RIGHT PROXIMAL FEMUR,REMOVAL INTERLOCKING SCREW ON LEFT HIP (Bilateral )   Anesthesia type:  General   Pre-op diagnosis:  STRESS FRACTURE RIGHT PROXIMAL FEMUR, REATINED DISTAL INTERLOCKING SCREW ON LEFT   Location:  [facility] ROOM 07 / [facility]   Surgeon:  Dorna Leitz, MD      DISCUSSION: - Pt is a 77 year old female with hx HTN, MVP, submassive PE (06/2017 post-op),  CKD (stage 4), bladder cancer (s/p TURBT, now self-caths)  Juliann Pulse in Dr. Berenice Primas' office is aware of pt's bladder hx, current UTI.   - Cr 1.85 at pre-admission testing, up from prior 1.7 at nephrologist's office.  Dr. Johnney Ou discontinued telmisartan.  I spoke with Dr. Johnney Ou by telephone.  She does not feel renal function needs to be rechecked prior to surgery.   - Last dose xarelto 12/06/17   VS: BP 107/66   Pulse 82   Temp 36.8 C   Resp 20   Ht 5\' 2"  (1.575 m)   Wt 165 lb 6.4 oz (75 kg)   SpO2 99%   BMI 30.25 kg/m    PROVIDERS: - PCP is Avva, Steva Ready, MD who cleared pt for surgery - Nephrologist is Jannifer Hick, MD. Last office visit 12/04/17.  - Used to see Mertie Moores, MD with cardiology for MVP, PACs. Last office visit 07/01/16; f/u prn recommended  LABS:  - PT 30.1, PTT 64. Will recheck day of surgery - UA consistent with UTI - Cr 1.85, BUN 27.  Prior Cr 1.7 at nephrologist office.  Telmisartan discontinued by Dr. Johnney Ou.    (all labs ordered are listed, but only abnormal results are displayed)  Labs Reviewed  APTT - Abnormal; Notable for the following components:      Result Value   aPTT 64 (*)    All other components within normal limits  BASIC METABOLIC PANEL - Abnormal; Notable for the following components:   Glucose, Bld 105 (*)    BUN 27 (*)    Creatinine, Ser 1.85 (*)    GFR calc non Af Amer 25 (*)    GFR calc Af Amer 29 (*)    All other components within normal  limits  CBC WITH DIFFERENTIAL/PLATELET - Abnormal; Notable for the following components:   RBC 3.57 (*)    Hemoglobin 10.6 (*)    MCV 100.8 (*)    MCHC 29.4 (*)    Platelets 481 (*)    All other components within normal limits  PROTIME-INR - Abnormal; Notable for the following components:   Prothrombin Time 30.1 (*)    All other components within normal limits  URINALYSIS, ROUTINE W REFLEX MICROSCOPIC - Abnormal; Notable for the following components:   APPearance TURBID (*)    Hgb urine dipstick MODERATE (*)    Protein, ur 30 (*)    Nitrite POSITIVE (*)    Leukocytes, UA MODERATE (*)    WBC, UA >50 (*)    Bacteria, UA MANY (*)    All other components within normal limits  TYPE AND SCREEN     IMAGES:  CT angio chest 06/30/17:  1. Positive for acute PE with CTevidence of right heart strain (RV/LV Ratio = ) consistent with at least submassive (intermediate risk) PE. The presence of right heart strain has been associated with an increased risk of morbidity and mortality. 2. Coronary artery disease. 3.  Aortic  atherosclerosis.  CXR 06/30/17: Stable, mild cardiomegaly. No evidence for acute pulmonary abnormality.   EKG 06/30/17: NSR. ST & T wave abnormality, consider inferior ischemia   CV:  Echo 10/04/17:  - Left ventricle: The cavity size was normal. Systolic function was normal. The estimated ejection fraction was in the range of 55% to 60%. Wall motion was normal; there were no regional wall motion abnormalities. Doppler parameters are consistent with abnormal left ventricular relaxation (grade 1 diastolic dysfunction). - Mitral valve: Calcified annulus. Mildly thickened, mildly   calcified leaflets . There was mild regurgitation. - Left atrium: The atrium was moderately dilated. - Pulmonary arteries: Systolic pressure was mildly increased. PA peak pressure: 32 mm Hg (S).   Past Medical History:  Diagnosis Date  . Anemia   . Arthritis   . Bladder absent Pt had bladder removed  in 2010  . Bladder cancer ([place])   . Breast cancer ([place]) 1985   left breast, lumpectomy and radiation  . Chronic kidney disease    does not empty bladder,self cath, Stage 3 CKD  . Complication of anesthesia   . GERD (gastroesophageal reflux disease)   . History of hiatal hernia   . History of radiation therapy 05/02/16-06/07/16   pelvis 55 Gy in 25 fractions  . Hypertension   . IBS (irritable bowel syndrome)    more diarrhea than constipation  . Mitral valve prolapse   . Personal history of radiation therapy 2005  . PONV (postoperative nausea and vomiting)   . Pulmonary embolus (HCC)    x 2 after surgery January 2019  . Restless legs   . Self-catheterizes urinary bladder     Past Surgical History:  Procedure Laterality Date  . ABDOMINAL HYSTERECTOMY  1980   total  . ANTERIOR AND POSTERIOR REPAIR N/A 01/25/2016   Procedure: Excision of suburethral mass;  Surgeon: Irine Seal, MD;  Location: [facility];  Service: Urology;  Laterality: N/A;  . APPENDECTOMY    . BLADDER REMOVAL     had neo bladder put in  . BLADDER SURGERY  2009   due to cancer  . BREAST BIOPSY Left 2005  . BREAST LUMPECTOMY Left 2005  . BREAST SURGERY  2005   Left Breast Lumpectomy  . CHOLECYSTECTOMY N/A 02/19/2013   Procedure: LAPAROSCOPIC CHOLECYSTECTOMY WITH INTRAOPERATIVE CHOLANGIOGRAM;  Surgeon: Ralene Ok, MD;  Location: [place];  Service: General;  Laterality: N/A;  . COLONOSCOPY    . FEMUR IM NAIL Left 06/21/2017   Procedure: INTRAMEDULLARY (IM) NAIL FEMORAL;  Surgeon: Dorna Leitz, MD;  Location: [place];  Service: Orthopedics;  Laterality: Left;  . IR GENERIC HISTORICAL  05/03/2016   IR FLUORO GUIDE CV LINE RIGHT 05/03/2016 Corrie Mckusick, DO [facility]  . IR GENERIC HISTORICAL  05/03/2016   IR US GUIDE VASC ACCESS RIGHT 05/03/2016 Corrie Mckusick, DO [facility]  . IR GENERIC HISTORICAL  05/31/2016   IR FLUORO GUIDE CV LINE LEFT 05/31/2016 Sandi Mariscal, MD [facility]  . IR GENERIC HISTORICAL  05/31/2016   IR US  GUIDE VASC ACCESS LEFT 05/31/2016 Sandi Mariscal, MD [facility]  . REPLACEMENT TOTAL KNEE Left   . TONSILLECTOMY      MEDICATIONS: . acetaminophen (TYLENOL) 500 MG tablet  . Calcium-Magnesium-Vitamin D (CALCIUM 1200+D3 PO)  . cetirizine (ZYRTEC) 10 MG tablet  . cholecalciferol (VITAMIN D) 1000 units tablet  . escitalopram (LEXAPRO) 20 MG tablet  . fluticasone (FLONASE) 50 MCG/ACT nasal spray  . furosemide (LASIX) 20 MG tablet  . HYDROcodone-acetaminophen ([place])  5-325 MG tablet  . loperamide (IMODIUM) 2 MG capsule  . Menthol, Topical Analgesic, (BENGAY ULTRA STRENGTH EX)  . omeprazole (PRILOSEC) 20 MG capsule  . potassium chloride (K-DUR) 10 MEQ tablet  . prochlorperazine (COMPAZINE) 10 MG tablet  . rivaroxaban (XARELTO) 20 MG TABS tablet  . Rivaroxaban 15 & 20 MG TBPK  . telmisartan (MICARDIS) 20 MG tablet  . tiZANidine (ZANAFLEX) 2 MG tablet  . VITAMIN B1-B12 IJ   No current facility-administered medications for this encounter.    - Last dose xarelto 12/06/17   If labs acceptable day of surgery, I anticipate pt can proceed with surgery as scheduled.  Willeen Cass, FNP-BC [facility]/Anesthesiology Phone: [phone] 12/06/2017 4:27 PM

## 2017-12-07 DIAGNOSIS — M84351A Stress fracture, right femur, initial encounter for fracture: Secondary | ICD-10-CM | POA: Diagnosis present

## 2017-12-07 NOTE — Progress Notes (Signed)
Positive antibodies; repeat type and screen on DOS.

## 2017-12-07 NOTE — H&P (Addendum)
PREOPERATIVE H&P  Chief Complaint: Stress fracture right proximal femur and delayed union of left proximal femoral fracture  HPI: Caitlin [name] is a 77 y.o. female who presents for evaluation of right stress fracture proximal femur and delayed union left femoral  fracture. It has been present for many months and has been worsening. She has failed conservative measures. Pain is rated as moderate.  Past Medical History:  Diagnosis Date  . Anemia   . Arthritis   . 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 Bladder cancer ([place])   . Breast cancer ([place]) 1985   left breast, lumpectomy and radiation  . Chronic kidney disease    does not empty bladder,self cath, Stage 3 CKD  . Complication of anesthesia   . GERD (gastroesophageal reflux disease)   . History of hiatal hernia   . History of radiation therapy 05/02/16-06/07/16   pelvis 55 Gy in 25 fractions  . Hypertension   . IBS (irritable bowel syndrome)    more diarrhea than constipation  . Mitral valve prolapse   . Personal history of radiation therapy 2005  . PONV (postoperative nausea and vomiting)   . Pulmonary embolus ([facility])    x 2 after surgery January 2019  . Restless legs   . Self-catheterizes urinary bladder    Past Surgical History:  Procedure Laterality Date  . ABDOMINAL HYSTERECTOMY  1980   total  . ANTERIOR AND POSTERIOR REPAIR N/A 01/25/2016   Procedure: Excision of suburethral mass;  Surgeon: Irine Seal, MD;  Location: [facility];  Service: Urology;  Laterality: N/A;  . APPENDECTOMY    . BLADDER REMOVAL     had neo bladder put in  . BLADDER SURGERY  2009   due to cancer  . BREAST BIOPSY Left 2005  . BREAST LUMPECTOMY Left 2005  . BREAST SURGERY  2005   Left Breast Lumpectomy  . CHOLECYSTECTOMY N/A 02/19/2013   Procedure: LAPAROSCOPIC CHOLECYSTECTOMY WITH INTRAOPERATIVE CHOLANGIOGRAM;  Surgeon: Ralene Ok, MD;  Location: [facility];  Service: General;  Laterality: N/A;  . COLONOSCOPY    . FEMUR IM NAIL Left 06/21/2017    Procedure: INTRAMEDULLARY (IM) NAIL FEMORAL;  Surgeon: Dorna Leitz, MD;  Location: [facility];  Service: Orthopedics;  Laterality: Left;  . IR GENERIC HISTORICAL  05/03/2016   IR FLUORO GUIDE CV LINE RIGHT 05/03/2016 Corrie Mckusick, DO [facility]  . IR GENERIC HISTORICAL  05/03/2016   IR US GUIDE VASC ACCESS RIGHT 05/03/2016 Corrie Mckusick, DO [facility]  . IR GENERIC HISTORICAL  05/31/2016   IR FLUORO GUIDE CV LINE LEFT 05/31/2016 Sandi Mariscal, MD [facility]  . IR GENERIC HISTORICAL  05/31/2016   IR US GUIDE VASC ACCESS LEFT 05/31/2016 Sandi Mariscal, MD [facility]  . REPLACEMENT TOTAL KNEE Left   . TONSILLECTOMY     Social History   Socioeconomic History  . Marital status: Married    Spouse name: Not on file  . Number of children: 1  . Years of education: Not on file  . Highest education level: Not on file  Occupational History  . Not on file  Social Needs  . Financial resource strain: Not on file  . Food insecurity:    Worry: Not on file    Inability: Not on file  . Transportation needs:    Medical: Not on file    Non-medical: Not on file  Tobacco Use  . Smoking status: Never Smoker  . Smokeless tobacco: Never Used  Substance and  Sexual Activity  . Alcohol use: No  . Drug use: No  . Sexual activity: Not Currently  Lifestyle  . Physical activity:    Days per week: Not on file    Minutes per session: Not on file  . Stress: Not on file  Relationships  . Social connections:    Talks on phone: Not on file    Gets together: Not on file    Attends religious service: Not on file    Active member of club or organization: Not on file    Attends meetings of clubs or organizations: Not on file    Relationship status: Not on file  Other Topics Concern  . Not on file  Social History Narrative  . Not on file   Family History  Problem Relation Age of Onset  . Stroke Mother   . Stroke Father   . Asthma Sister    No Known Allergies Prior to Admission medications   Medication Sig  Start Date End Date Taking? Authorizing Provider  acetaminophen (TYLENOL) 500 MG tablet Take 1,000 mg by mouth daily.    Yes [provider]  Calcium-Magnesium-Vitamin D (CALCIUM 1200+D3 PO) Take 1,200 mg by mouth daily.   Yes [provider]  cetirizine (ZYRTEC) 10 MG tablet Take 10 mg by mouth daily.   Yes [provider]  cholecalciferol (VITAMIN D) 1000 units tablet Take 1 tablet by mouth.    Yes [provider]  escitalopram (LEXAPRO) 20 MG tablet Take 20 mg by mouth daily.    Yes [provider]  fluticasone (FLONASE) 50 MCG/ACT nasal spray Place 1 spray into the nose daily as needed for allergies.  06/22/12  Yes [provider]  furosemide (LASIX) 20 MG tablet Take 20 mg by mouth daily. 06/29/17  Yes [provider]  HYDROcodone-acetaminophen (NORCO) 5-325 MG tablet Take 1-2 tablets by mouth every 6 (six) hours as needed for moderate pain. 06/22/17  Yes Gary Fleet, PA-C  loperamide (IMODIUM) 2 MG capsule Take 2 mg by mouth as needed for diarrhea or loose stools.   Yes [provider]  Menthol, Topical Analgesic, (BENGAY ULTRA STRENGTH EX) Apply 1 application topically as needed (hip pain).   Yes [provider]  omeprazole (PRILOSEC) 20 MG capsule Take 20 mg by mouth daily.  06/25/12  Yes [provider]  potassium chloride (K-DUR) 10 MEQ tablet Take 10 mEq by mouth daily.  07/06/17  Yes [provider]  rivaroxaban (XARELTO) 20 MG TABS tablet Take 20 mg by mouth daily with supper.   Yes [provider]  telmisartan (MICARDIS) 20 MG tablet Take 20 mg by mouth daily.   Yes [provider]  tiZANidine (ZANAFLEX) 2 MG tablet Take 2 mg by mouth as needed for muscle spasms.  09/21/17  Yes [provider]  VITAMIN B1-B12 IJ Inject as directed every 30 (thirty) days.   Yes [provider]  prochlorperazine (COMPAZINE) 10 MG tablet Take 1 tablet (10 mg total) by mouth  every 6 (six) hours as needed for nausea or vomiting. Patient not taking: Reported on 07/07/2016 05/03/16   Ladell Pier, MD  Rivaroxaban 15 & 20 MG TBPK Start with one 15mg  tablet by mouth twice a day with food. On Day 22, switch to one 20mg  tablet once a day with food. Patient not taking: Reported on 10/02/2017 07/03/17   Dessa Phi, DO     Positive ROS: None  All other systems have been reviewed and were  otherwise negative with the exception of those mentioned in the HPI and as above.  Physical Exam: There were no vitals filed for this visit.  General: Alert, no acute distress Cardiovascular: No pedal edema Respiratory: No cyanosis, no use of accessory musculature GI: No organomegaly, abdomen is soft and non-tender Skin: No lesions in the area of chief complaint Neurologic: Sensation intact distally Psychiatric: Patient is competent for consent with normal mood and affect Lymphatic: No axillary or cervical lymphadenopathy  MUSCULOSKELETAL: Right leg: Tender to palpation in the proximal femur.  Pain with range of motion.  No instability.  Neurovascular intact distally.  Left leg: Pain with range of motion.  Pain with ambulation.  Neurovascular intact distally.  Assessment/Plan: STRESS FRACTURE RIGHT PROXIMAL FEMUR, REATINED DISTAL INTERLOCKING SCREW ON LEFT Plan for Procedure(s): INTRAMEDULLARY (IM) RETROGRADE FEMORAL NAILING RIGHT PROXIMAL FEMUR,REMOVAL INTERLOCKING SCREW ON LEFT HIP  The risks benefits and alternatives were discussed with the patient including but not limited to the risks of nonoperative treatment, versus surgical intervention including infection, bleeding, nerve injury, malunion, nonunion, hardware prominence, hardware failure, need for hardware removal, blood clots, cardiopulmonary complications, morbidity, mortality, among others, and they were willing to proceed.  Predicted outcome is good, although there will be at least a six to nine month expected  recovery.  No change in H@P  overnight.  Alta Corning, MD 12/07/2017 8:15 PM

## 2017-12-08 ENCOUNTER — Encounter (HOSPITAL_COMMUNITY): Payer: Self-pay | Admitting: *Deleted

## 2017-12-08 ENCOUNTER — Inpatient Hospital Stay (HOSPITAL_COMMUNITY): Payer: Medicare Other | Admitting: Emergency Medicine

## 2017-12-08 ENCOUNTER — Inpatient Hospital Stay (HOSPITAL_COMMUNITY): Payer: Medicare Other

## 2017-12-08 ENCOUNTER — Encounter (HOSPITAL_COMMUNITY)
Admission: RE | Disposition: A | Discharge status: Home Health | Payer: Self-pay | Source: Ambulatory Visit | Attending: Orthopedic Surgery

## 2017-12-08 ENCOUNTER — Inpatient Hospital Stay (HOSPITAL_COMMUNITY)
Admission: RE | Admit: 2017-12-08 | Discharge: 2017-12-09 | DRG: 482 | Disposition: A | Discharge status: Home Health | Payer: Medicare Other | Source: Ambulatory Visit | Attending: Orthopedic Surgery | Admitting: Orthopedic Surgery

## 2017-12-08 ENCOUNTER — Other Ambulatory Visit: Payer: Self-pay

## 2017-12-08 DIAGNOSIS — Z96652 Presence of left artificial knee joint: Secondary | ICD-10-CM | POA: Diagnosis present

## 2017-12-08 DIAGNOSIS — Z79899 Other long term (current) drug therapy: Secondary | ICD-10-CM

## 2017-12-08 DIAGNOSIS — Z7901 Long term (current) use of anticoagulants: Secondary | ICD-10-CM | POA: Diagnosis not present

## 2017-12-08 DIAGNOSIS — Z8551 Personal history of malignant neoplasm of bladder: Secondary | ICD-10-CM | POA: Diagnosis not present

## 2017-12-08 DIAGNOSIS — Z906 Acquired absence of other parts of urinary tract: Secondary | ICD-10-CM | POA: Diagnosis not present

## 2017-12-08 DIAGNOSIS — Z923 Personal history of irradiation: Secondary | ICD-10-CM

## 2017-12-08 DIAGNOSIS — N183 Chronic kidney disease, stage 3 (moderate): Secondary | ICD-10-CM | POA: Diagnosis present

## 2017-12-08 DIAGNOSIS — X58XXXD Exposure to other specified factors, subsequent encounter: Secondary | ICD-10-CM | POA: Diagnosis present

## 2017-12-08 DIAGNOSIS — Z969 Presence of functional implant, unspecified: Secondary | ICD-10-CM

## 2017-12-08 DIAGNOSIS — Z472 Encounter for removal of internal fixation device: Secondary | ICD-10-CM

## 2017-12-08 DIAGNOSIS — M84351A Stress fracture, right femur, initial encounter for fracture: Secondary | ICD-10-CM | POA: Diagnosis present

## 2017-12-08 DIAGNOSIS — G2581 Restless legs syndrome: Secondary | ICD-10-CM | POA: Diagnosis present

## 2017-12-08 DIAGNOSIS — Z853 Personal history of malignant neoplasm of breast: Secondary | ICD-10-CM | POA: Diagnosis not present

## 2017-12-08 DIAGNOSIS — Z9189 Other specified personal risk factors, not elsewhere classified: Secondary | ICD-10-CM | POA: Diagnosis not present

## 2017-12-08 DIAGNOSIS — Z86711 Personal history of pulmonary embolism: Secondary | ICD-10-CM | POA: Diagnosis not present

## 2017-12-08 DIAGNOSIS — Z419 Encounter for procedure for purposes other than remedying health state, unspecified: Secondary | ICD-10-CM

## 2017-12-08 DIAGNOSIS — I341 Nonrheumatic mitral (valve) prolapse: Secondary | ICD-10-CM | POA: Diagnosis present

## 2017-12-08 DIAGNOSIS — K219 Gastro-esophageal reflux disease without esophagitis: Secondary | ICD-10-CM | POA: Diagnosis present

## 2017-12-08 DIAGNOSIS — M1990 Unspecified osteoarthritis, unspecified site: Secondary | ICD-10-CM | POA: Diagnosis present

## 2017-12-08 DIAGNOSIS — S72002G Fracture of unspecified part of neck of left femur, subsequent encounter for closed fracture with delayed healing: Secondary | ICD-10-CM

## 2017-12-08 DIAGNOSIS — D631 Anemia in chronic kidney disease: Secondary | ICD-10-CM | POA: Diagnosis present

## 2017-12-08 DIAGNOSIS — I129 Hypertensive chronic kidney disease with stage 1 through stage 4 chronic kidney disease, or unspecified chronic kidney disease: Secondary | ICD-10-CM | POA: Diagnosis present

## 2017-12-08 HISTORY — PX: FEMUR IM NAIL: SHX1597

## 2017-12-08 LAB — PROTIME-INR
INR: 1.07
PROTHROMBIN TIME: 13.9 s (ref 11.4–15.2)

## 2017-12-08 LAB — TYPE AND SCREEN
ABO/RH(D): A NEG
ANTIBODY SCREEN: POSITIVE
DONOR AG TYPE: NEGATIVE
Donor AG Type: NEGATIVE
Unit division: 0
Unit division: 0

## 2017-12-08 LAB — BPAM RBC
BLOOD PRODUCT EXPIRATION DATE: 201907292359
Blood Product Expiration Date: 201907242359
UNIT TYPE AND RH: 600
UNIT TYPE AND RH: 600

## 2017-12-08 LAB — PREPARE RBC (CROSSMATCH)

## 2017-12-08 LAB — APTT: aPTT: 34 seconds (ref 24–36)

## 2017-12-08 SURGERY — INSERTION, INTRAMEDULLARY ROD, FEMUR, RETROGRADE
Anesthesia: General | Site: Hip | Laterality: Bilateral

## 2017-12-08 MED ORDER — POLYETHYLENE GLYCOL 3350 17 G PO PACK: 17.0000 g | PACK | Freq: Every day | ORAL | Status: DC | PRN

## 2017-12-08 MED ORDER — PROPOFOL 10 MG/ML IV BOLUS
INTRAVENOUS | Status: DC | PRN
  Administered 2017-12-08: 100 mg via INTRAVENOUS

## 2017-12-08 MED ORDER — PANTOPRAZOLE SODIUM 40 MG PO TBEC
40.0000 mg | DELAYED_RELEASE_TABLET | Freq: Every day | ORAL | Status: DC
  Administered 2017-12-08 – 2017-12-09 (×2): 40 mg via ORAL
  Filled 2017-12-08 (×2): qty 1

## 2017-12-08 MED ORDER — FENTANYL CITRATE (PF) 100 MCG/2ML IJ SOLN
INTRAMUSCULAR | Status: DC | PRN
  Administered 2017-12-08 (×4): 50 ug via INTRAVENOUS

## 2017-12-08 MED ORDER — PHENYLEPHRINE 40 MCG/ML (10ML) SYRINGE FOR IV PUSH (FOR BLOOD PRESSURE SUPPORT)
PREFILLED_SYRINGE | INTRAVENOUS | Status: DC | PRN
  Administered 2017-12-08 (×2): 80 ug via INTRAVENOUS

## 2017-12-08 MED ORDER — OXYCODONE-ACETAMINOPHEN 5-325 MG PO TABS: 1.0000 | ORAL_TABLET | Freq: Four times a day (QID) | ORAL | 0 refills | Status: DC | PRN

## 2017-12-08 MED ORDER — CEFAZOLIN SODIUM-DEXTROSE 2-4 GM/100ML-% IV SOLN
2.0000 g | INTRAVENOUS | Status: AC
  Administered 2017-12-08: 2 g via INTRAVENOUS
  Filled 2017-12-08: qty 100

## 2017-12-08 MED ORDER — METHOCARBAMOL 1000 MG/10ML IJ SOLN
500.0000 mg | Freq: Four times a day (QID) | INTRAVENOUS | Status: DC | PRN
  Administered 2017-12-08: 500 mg via INTRAVENOUS
  Filled 2017-12-08: qty 550

## 2017-12-08 MED ORDER — BUPIVACAINE HCL (PF) 0.5 % IJ SOLN
INTRAMUSCULAR | Status: AC
  Filled 2017-12-08: qty 30

## 2017-12-08 MED ORDER — STERILE WATER FOR IRRIGATION IR SOLN
Status: DC | PRN
  Administered 2017-12-08: 1000 mL

## 2017-12-08 MED ORDER — DEXTROSE-NACL 5-0.45 % IV SOLN
INTRAVENOUS | Status: DC
  Administered 2017-12-08: 75 mL/h via INTRAVENOUS

## 2017-12-08 MED ORDER — ROCURONIUM BROMIDE 10 MG/ML (PF) SYRINGE
PREFILLED_SYRINGE | INTRAVENOUS | Status: DC | PRN
  Administered 2017-12-08: 50 mg via INTRAVENOUS

## 2017-12-08 MED ORDER — PHENYLEPHRINE 40 MCG/ML (10ML) SYRINGE FOR IV PUSH (FOR BLOOD PRESSURE SUPPORT)
PREFILLED_SYRINGE | INTRAVENOUS | Status: AC
  Filled 2017-12-08: qty 10

## 2017-12-08 MED ORDER — MEPERIDINE HCL 50 MG/ML IJ SOLN: 6.2500 mg | INTRAMUSCULAR | Status: DC | PRN

## 2017-12-08 MED ORDER — ACETAMINOPHEN 325 MG PO TABS: 325.0000 mg | ORAL_TABLET | Freq: Four times a day (QID) | ORAL | Status: DC | PRN

## 2017-12-08 MED ORDER — LACTATED RINGERS IV SOLN
INTRAVENOUS | Status: DC
  Administered 2017-12-08 (×2): via INTRAVENOUS

## 2017-12-08 MED ORDER — ACETAMINOPHEN 10 MG/ML IV SOLN: 1000.0000 mg | Freq: Once | INTRAVENOUS | Status: DC | PRN

## 2017-12-08 MED ORDER — BUPIVACAINE HCL (PF) 0.5 % IJ SOLN
INTRAMUSCULAR | Status: DC | PRN
  Administered 2017-12-08: 20 mL

## 2017-12-08 MED ORDER — SUGAMMADEX SODIUM 200 MG/2ML IV SOLN
INTRAVENOUS | Status: DC | PRN
  Administered 2017-12-08: 200 mg via INTRAVENOUS

## 2017-12-08 MED ORDER — SUGAMMADEX SODIUM 200 MG/2ML IV SOLN
INTRAVENOUS | Status: AC
  Filled 2017-12-08: qty 2

## 2017-12-08 MED ORDER — HYDROMORPHONE HCL 1 MG/ML IJ SOLN: 0.5000 mg | INTRAMUSCULAR | Status: DC | PRN

## 2017-12-08 MED ORDER — ONDANSETRON HCL 4 MG PO TABS: 4.0000 mg | ORAL_TABLET | Freq: Four times a day (QID) | ORAL | Status: DC | PRN

## 2017-12-08 MED ORDER — FENTANYL CITRATE (PF) 100 MCG/2ML IJ SOLN
INTRAMUSCULAR | Status: AC
  Filled 2017-12-08: qty 2

## 2017-12-08 MED ORDER — HYDROMORPHONE HCL 1 MG/ML IJ SOLN
0.2500 mg | INTRAMUSCULAR | Status: DC | PRN
  Administered 2017-12-08 (×4): 0.5 mg via INTRAVENOUS

## 2017-12-08 MED ORDER — PROMETHAZINE HCL 25 MG/ML IJ SOLN: 6.2500 mg | INTRAMUSCULAR | Status: DC | PRN

## 2017-12-08 MED ORDER — LIDOCAINE 2% (20 MG/ML) 5 ML SYRINGE
INTRAMUSCULAR | Status: DC | PRN
  Administered 2017-12-08: 80 mg via INTRAVENOUS

## 2017-12-08 MED ORDER — ONDANSETRON HCL 4 MG/2ML IJ SOLN: 4.0000 mg | Freq: Four times a day (QID) | INTRAMUSCULAR | Status: DC | PRN

## 2017-12-08 MED ORDER — BISACODYL 5 MG PO TBEC
5.0000 mg | DELAYED_RELEASE_TABLET | Freq: Every day | ORAL | Status: DC | PRN
Start: 2017-12-08 — End: 2017-12-09

## 2017-12-08 MED ORDER — CEFAZOLIN SODIUM-DEXTROSE 2-4 GM/100ML-% IV SOLN
2.0000 g | Freq: Four times a day (QID) | INTRAVENOUS | Status: AC
Start: 2017-12-08 — End: 2017-12-09
  Administered 2017-12-08 – 2017-12-09 (×2): 2 g via INTRAVENOUS
  Filled 2017-12-08 (×2): qty 100

## 2017-12-08 MED ORDER — 0.9 % SODIUM CHLORIDE (POUR BTL) OPTIME
TOPICAL | Status: DC | PRN
  Administered 2017-12-08: 1000 mL

## 2017-12-08 MED ORDER — DEXAMETHASONE SODIUM PHOSPHATE 10 MG/ML IJ SOLN
INTRAMUSCULAR | Status: AC
  Filled 2017-12-08: qty 1

## 2017-12-08 MED ORDER — IRBESARTAN 75 MG PO TABS: 75.0000 mg | ORAL_TABLET | Freq: Every day | ORAL | Status: DC

## 2017-12-08 MED ORDER — LIDOCAINE 2% (20 MG/ML) 5 ML SYRINGE
INTRAMUSCULAR | Status: AC
  Filled 2017-12-08: qty 5

## 2017-12-08 MED ORDER — OXYCODONE HCL 5 MG PO TABS
5.0000 mg | ORAL_TABLET | ORAL | Status: DC | PRN
  Administered 2017-12-08 (×2): 5 mg via ORAL
  Filled 2017-12-08 (×2): qty 1

## 2017-12-08 MED ORDER — CHLORHEXIDINE GLUCONATE 4 % EX LIQD: 60.0000 mL | Freq: Once | CUTANEOUS | Status: DC

## 2017-12-08 MED ORDER — DEXAMETHASONE SODIUM PHOSPHATE 10 MG/ML IJ SOLN
INTRAMUSCULAR | Status: DC | PRN
  Administered 2017-12-08: 5 mg via INTRAVENOUS

## 2017-12-08 MED ORDER — METHOCARBAMOL 500 MG PO TABS: 500.0000 mg | ORAL_TABLET | Freq: Four times a day (QID) | ORAL | Status: DC | PRN

## 2017-12-08 MED ORDER — RIVAROXABAN 10 MG PO TABS: 20.0000 mg | ORAL_TABLET | Freq: Every day | ORAL | Status: DC

## 2017-12-08 MED ORDER — EPHEDRINE SULFATE-NACL 50-0.9 MG/10ML-% IV SOSY
PREFILLED_SYRINGE | INTRAVENOUS | Status: DC | PRN
  Administered 2017-12-08: 5 mg via INTRAVENOUS

## 2017-12-08 MED ORDER — DOCUSATE SODIUM 100 MG PO CAPS
100.0000 mg | ORAL_CAPSULE | Freq: Two times a day (BID) | ORAL | Status: DC
  Filled 2017-12-08: qty 1

## 2017-12-08 MED ORDER — HYDROMORPHONE HCL 1 MG/ML IJ SOLN
INTRAMUSCULAR | Status: AC
  Filled 2017-12-08: qty 2

## 2017-12-08 MED ORDER — ONDANSETRON HCL 4 MG/2ML IJ SOLN
INTRAMUSCULAR | Status: AC
  Filled 2017-12-08: qty 2

## 2017-12-08 MED ORDER — EPHEDRINE 5 MG/ML INJ
INTRAVENOUS | Status: AC
  Filled 2017-12-08: qty 10

## 2017-12-08 MED ORDER — ROCURONIUM BROMIDE 100 MG/10ML IV SOLN
INTRAVENOUS | Status: AC
  Filled 2017-12-08: qty 1

## 2017-12-08 MED ORDER — ACETAMINOPHEN 500 MG PO TABS
1000.0000 mg | ORAL_TABLET | Freq: Four times a day (QID) | ORAL | Status: DC
  Administered 2017-12-08 – 2017-12-09 (×2): 1000 mg via ORAL
  Filled 2017-12-08 (×3): qty 2

## 2017-12-08 MED ORDER — FUROSEMIDE 20 MG PO TABS
20.0000 mg | ORAL_TABLET | Freq: Every day | ORAL | Status: DC
  Filled 2017-12-08: qty 1

## 2017-12-08 MED ORDER — ACETAMINOPHEN 500 MG PO TABS: 1000.0000 mg | ORAL_TABLET | Freq: Every day | ORAL | Status: DC

## 2017-12-08 SURGICAL SUPPLY — 62 items
BAG ZIPLOCK 12X15 (MISCELLANEOUS) ×3 IMPLANT
BIT DRILL 4.3MMS DISTAL GRDTED (BIT) ×1 IMPLANT
BLADE SURG 15 STRL LF DISP TIS (BLADE) ×1 IMPLANT
BLADE SURG 15 STRL SS (BLADE) ×2
BOOTIES KNEE HIGH SLOAN (MISCELLANEOUS) ×3 IMPLANT
CONT SPEC 4OZ CLIKSEAL STRL BL (MISCELLANEOUS) ×3 IMPLANT
COVER BACK TABLE 60X90IN (DRAPES) IMPLANT
COVER MAYO STAND STRL (DRAPES) ×3 IMPLANT
COVER SURGICAL LIGHT HANDLE (MISCELLANEOUS) ×3 IMPLANT
DECANTER SPIKE VIAL GLASS SM (MISCELLANEOUS) ×3 IMPLANT
DRAPE C-ARM 42X120 X-RAY (DRAPES) ×3 IMPLANT
DRAPE ORTHO SPLIT 77X108 STRL (DRAPES)
DRAPE STERI IOBAN 125X83 (DRAPES) ×3 IMPLANT
DRAPE SURG 17X11 SM STRL (DRAPES) ×3 IMPLANT
DRAPE SURG ORHT 6 SPLT 77X108 (DRAPES) IMPLANT
DRAPE U-SHAPE 47X51 STRL (DRAPES) ×3 IMPLANT
DRILL 4.3MMS DISTAL GRADUATED (BIT) ×3
DRSG MEPILEX BORDER 4X4 (GAUZE/BANDAGES/DRESSINGS) ×12 IMPLANT
DRSG PAD ABDOMINAL 8X10 ST (GAUZE/BANDAGES/DRESSINGS) IMPLANT
DURAPREP 26ML APPLICATOR (WOUND CARE) ×6 IMPLANT
ELECT PENCIL ROCKER SW 15FT (MISCELLANEOUS) ×3 IMPLANT
ELECT REM PT RETURN 15FT ADLT (MISCELLANEOUS) ×3 IMPLANT
GAUZE SPONGE 4X4 12PLY STRL (GAUZE/BANDAGES/DRESSINGS) IMPLANT
GAUZE XEROFORM 1X8 LF (GAUZE/BANDAGES/DRESSINGS) ×3 IMPLANT
GLOVE BIO SURGEON STRL SZ8 (GLOVE) ×6 IMPLANT
GLOVE BIOGEL PI IND STRL 6.5 (GLOVE) ×1 IMPLANT
GLOVE BIOGEL PI IND STRL 7.0 (GLOVE) ×1 IMPLANT
GLOVE BIOGEL PI IND STRL 7.5 (GLOVE) ×2 IMPLANT
GLOVE BIOGEL PI INDICATOR 6.5 (GLOVE) ×2
GLOVE BIOGEL PI INDICATOR 7.0 (GLOVE) ×2
GLOVE BIOGEL PI INDICATOR 7.5 (GLOVE) ×4
GLOVE ECLIPSE 7.5 STRL STRAW (GLOVE) ×6 IMPLANT
GLOVE SURG SS PI 6.0 STRL IVOR (GLOVE) ×3 IMPLANT
GOWN STRL REUS W/TWL LRG LVL3 (GOWN DISPOSABLE) ×3 IMPLANT
GOWN STRL REUS W/TWL XL LVL3 (GOWN DISPOSABLE) ×9 IMPLANT
GUIDEPIN 3.2X17.5 THRD DISP (PIN) ×6 IMPLANT
GUIDEWIRE BALL NOSE 100CM (WIRE) ×3 IMPLANT
HFN LAG SCREW 10.5MM X 85MM (Screw) ×3 IMPLANT
HFN RH 130 DEG 9MM X 340MM (Nail) ×3 IMPLANT
HOLDER FOLEY CATH W/STRAP (MISCELLANEOUS) ×3 IMPLANT
KIT BASIN OR (CUSTOM PROCEDURE TRAY) ×3 IMPLANT
MANIFOLD NEPTUNE II (INSTRUMENTS) ×3 IMPLANT
NEEDLE HYPO 22GX1.5 SAFETY (NEEDLE) ×3 IMPLANT
NS IRRIG 1000ML POUR BTL (IV SOLUTION) ×3 IMPLANT
PACK GENERAL/GYN (CUSTOM PROCEDURE TRAY) IMPLANT
PACK TOTAL JOINT (CUSTOM PROCEDURE TRAY) ×3 IMPLANT
POSITIONER SURGICAL ARM (MISCELLANEOUS) ×3 IMPLANT
SCREW BONE CORTICAL 5.0X38 (Screw) ×3 IMPLANT
SCREW LAG HIP NAIL 10.5X95 (Screw) ×3 IMPLANT
SET PAD KNEE POSITIONER (MISCELLANEOUS) ×3 IMPLANT
STAPLER VISISTAT 35W (STAPLE) ×3 IMPLANT
SUT VIC AB 0 CT1 27 (SUTURE) ×2
SUT VIC AB 0 CT1 27XBRD ANTBC (SUTURE) ×1 IMPLANT
SUT VIC AB 1 CT1 36 (SUTURE) ×3 IMPLANT
SUT VIC AB 2-0 CT1 27 (SUTURE) ×4
SUT VIC AB 2-0 CT1 TAPERPNT 27 (SUTURE) ×2 IMPLANT
SYR CONTROL 10ML LL (SYRINGE) ×3 IMPLANT
TOWEL OR 17X26 10 PK STRL BLUE (TOWEL DISPOSABLE) ×3 IMPLANT
TRAY FOLEY CATH 14FRSI W/METER (CATHETERS) ×3 IMPLANT
TRAY FOLEY MTR SLVR 16FR STAT (SET/KITS/TRAYS/PACK) IMPLANT
WATER STERILE IRR 1000ML POUR (IV SOLUTION) ×3 IMPLANT
YANKAUER SUCT BULB TIP 10FT TU (MISCELLANEOUS) ×3 IMPLANT

## 2017-12-08 NOTE — Transfer of Care (Signed)
Immediate Anesthesia Transfer of Care Note  Patient: Caitlin [name]  Procedure(s) Performed: INTRAMEDULLARY (IM) RETROGRADE FEMORAL NAILING RIGHT PROXIMAL FEMUR,REMOVAL INTERLOCKING SCREW ON LEFT HIP (Bilateral Hip)  Patient Location: PACU  Anesthesia Type:General  Level of Consciousness: awake, alert , oriented and patient cooperative  Airway & Oxygen Therapy: Patient Spontanous Breathing and Patient connected to face mask  Post-op Assessment: Report given to RN and Post -op Vital signs reviewed and stable  Post vital signs: Reviewed and stable  Last Vitals:  Vitals Value Taken Time  BP 105/70 12/08/2017  3:05 PM  Temp    Pulse 73 12/08/2017  3:07 PM  Resp    SpO2 99 % 12/08/2017  3:07 PM  Vitals shown include unvalidated device data.  Last Pain:  Vitals:   12/08/17 1031  TempSrc: Oral         Complications: No apparent anesthesia complications

## 2017-12-08 NOTE — Anesthesia Procedure Notes (Signed)
Procedure Name: Intubation Date/Time: 12/08/2017 1:22 PM Performed by: Claudia Desanctis, CRNA Pre-anesthesia Checklist: Patient identified, Emergency Drugs available, Suction available and Patient being monitored Patient Re-evaluated:Patient Re-evaluated prior to induction Oxygen Delivery Method: Circle system utilized Preoxygenation: Pre-oxygenation with 100% oxygen Induction Type: IV induction Ventilation: Mask ventilation without difficulty Laryngoscope Size: 2 and Miller Grade View: Grade I Tube type: Oral Tube size: 7.0 mm Number of attempts: 1 Airway Equipment and Method: Stylet Placement Confirmation: ETT inserted through vocal cords under direct vision,  positive ETCO2 and breath sounds checked- equal and bilateral Secured at: 22 cm Tube secured with: Tape Dental Injury: Teeth and Oropharynx as per pre-operative assessment

## 2017-12-08 NOTE — Progress Notes (Signed)
[facility]  Patient Status: New  [facility] is providing the following services: PT  If patient discharges after hours, please call [phone].   Caitlin [name] 12/08/2017, 1:43 PM

## 2017-12-08 NOTE — Anesthesia Preprocedure Evaluation (Addendum)
Anesthesia Evaluation  Patient identified by MRN, date of birth, ID band Patient awake    Reviewed: Allergy & Precautions, NPO status , Patient's Chart, lab work & pertinent test results  History of Anesthesia Complications (+) PONV  Airway Mallampati: II  TM Distance: >3 FB Neck ROM: Full    Dental  (+) Teeth Intact, Dental Advisory Given   Pulmonary neg pulmonary ROS,    Pulmonary exam normal breath sounds clear to auscultation       Cardiovascular hypertension, Normal cardiovascular exam Rhythm:Regular Rate:Normal     Neuro/Psych negative neurological ROS     GI/Hepatic Neg liver ROS, GERD  Medicated,  Endo/Other  negative endocrine ROS  Renal/GU Renal InsufficiencyRenal disease     Musculoskeletal   Abdominal Normal abdominal exam  (+)   Peds  Hematology  (+) anemia ,   Anesthesia Other Findings KENISE BARRACO  ECHO COMPLETE WO IMAGING ENHANCING AGENT  Order# [id number]  Reading physician: Jerline Pain, MD Ordering physician: Prince Solian, MD Study date: 10/04/17 Study Result   Result status: Final result                          [facility]                        [address], [place] [address]                            [phone]  ------------------------------------------------------------------- Transthoracic Echocardiography  Patient:    Gitel, Beste MR #:       [id number] Study Date: 10/04/2017 Gender:     F Age:        76 Height:     157.5 cm Weight:     75 kg BSA:        1.84 m^2 Pt. Status: Room:   Loletta Specter [id number]  Timoteo Gaul [id number]  SONOGRAPHER  Diamond Nickel  ATTENDING    Candee Furbish, M.D.  PERFORMING   [facility], Outpatient  cc:  ------------------------------------------------------------------- LV EF: 55% -   60%  -------------------------------------------------------------------     Reproductive/Obstetrics                            Lab Results  Component Value Date   WBC 8.8 12/05/2017   HGB 10.6 (L) 12/05/2017   HCT 36.0 12/05/2017   MCV 100.8 (H) 12/05/2017   PLT 481 (H) 12/05/2017   Lab Results  Component Value Date   CREATININE 1.85 (H) 12/05/2017   BUN 27 (H) 12/05/2017   NA 137 12/05/2017   K 4.7 12/05/2017   CL 105 12/05/2017   CO2 22 12/05/2017    Anesthesia Physical  Anesthesia Plan  ASA: III  Anesthesia Plan: General   Post-op Pain Management:    Induction: Intravenous  PONV Risk Score and Plan: 3 and Ondansetron, Dexamethasone and Treatment may vary due to age or medical condition  Airway Management Planned: Oral ETT  Additional Equipment:   Intra-op Plan:   Post-operative Plan: Extubation in OR  Informed Consent: I have reviewed the patients History and Physical, chart, labs and discussed the procedure including  the risks, benefits and alternatives for the proposed anesthesia with the patient or authorized representative who has indicated his/her understanding and acceptance.   Dental advisory given  Plan Discussed with: CRNA and Surgeon  Anesthesia Plan Comments:        Anesthesia Quick Evaluation

## 2017-12-09 LAB — BASIC METABOLIC PANEL
ANION GAP: 10 (ref 5–15)
BUN: 29 mg/dL — ABNORMAL HIGH (ref 8–23)
CO2: 24 mmol/L (ref 22–32)
Calcium: 9 mg/dL (ref 8.9–10.3)
Chloride: 105 mmol/L (ref 98–111)
Creatinine, Ser: 1.54 mg/dL — ABNORMAL HIGH (ref 0.44–1.00)
GFR calc Af Amer: 36 mL/min — ABNORMAL LOW (ref >60)
GFR calc non Af Amer: 31 mL/min — ABNORMAL LOW (ref >60)
GLUCOSE: 167 mg/dL — AB (ref 70–99)
POTASSIUM: 4.6 mmol/L (ref 3.5–5.1)
Sodium: 139 mmol/L (ref 135–145)

## 2017-12-09 LAB — CBC
HEMATOCRIT: 29.8 % — AB (ref 36.0–46.0)
HEMOGLOBIN: 9.3 g/dL — AB (ref 12.0–15.0)
MCH: 30.7 pg (ref 26.0–34.0)
MCHC: 31.2 g/dL (ref 30.0–36.0)
MCV: 98.3 fL (ref 78.0–100.0)
Platelets: 524 10*3/uL — ABNORMAL HIGH (ref 150–400)
RBC: 3.03 MIL/uL — ABNORMAL LOW (ref 3.87–5.11)
RDW: 15.1 % (ref 11.5–15.5)
WBC: 10.1 10*3/uL (ref 4.0–10.5)

## 2017-12-09 MED ORDER — ENOXAPARIN (LOVENOX) PATIENT EDUCATION KIT
PACK | Freq: Once | Status: DC
  Filled 2017-12-09: qty 1

## 2017-12-09 MED ORDER — ENOXAPARIN SODIUM 30 MG/0.3ML ~~LOC~~ SOLN: 30.0000 mg | Freq: Every day | SUBCUTANEOUS | 0 refills | Status: AC

## 2017-12-09 MED ORDER — ENOXAPARIN SODIUM 30 MG/0.3ML ~~LOC~~ SOLN
30.0000 mg | Freq: Every day | SUBCUTANEOUS | Status: DC
  Administered 2017-12-09: 30 mg via SUBCUTANEOUS
  Filled 2017-12-09: qty 0.3

## 2017-12-09 MED ORDER — PATIENT'S GUIDE TO USING COUMADIN BOOK
Freq: Once | Status: DC
Start: 2017-12-09 — End: 2017-12-09
  Administered 2017-12-09: 12:00:00
  Filled 2017-12-09: qty 1

## 2017-12-09 MED ORDER — WARFARIN SODIUM 2.5 MG PO TABS: 2.5000 mg | ORAL_TABLET | Freq: Every day | ORAL | 1 refills | Status: AC

## 2017-12-09 MED ORDER — COUMADIN BOOK
Freq: Once | Status: AC
  Administered 2017-12-09: 12:00:00
  Filled 2017-12-09: qty 1

## 2017-12-09 MED ORDER — HYDROCODONE-ACETAMINOPHEN 5-325 MG PO TABS
1.0000 | ORAL_TABLET | ORAL | Status: DC | PRN
  Administered 2017-12-09: 1 via ORAL
  Filled 2017-12-09: qty 1

## 2017-12-09 MED ORDER — WARFARIN SODIUM 2.5 MG PO TABS
2.5000 mg | ORAL_TABLET | Freq: Every day | ORAL | Status: DC
Start: 2017-12-10 — End: 2017-12-09

## 2017-12-09 MED ORDER — HYDROCODONE-ACETAMINOPHEN 5-325 MG PO TABS: 1.0000 | ORAL_TABLET | ORAL | 0 refills | Status: AC | PRN

## 2017-12-09 MED ORDER — WARFARIN SODIUM 5 MG PO TABS
5.0000 mg | ORAL_TABLET | Freq: Once | ORAL | Status: AC
  Administered 2017-12-09: 5 mg via ORAL
  Filled 2017-12-09: qty 1

## 2017-12-09 MED ORDER — WARFARIN VIDEO: Freq: Once | Status: DC

## 2017-12-09 MED ORDER — WARFARIN - PHARMACIST DOSING INPATIENT: Freq: Every day | Status: DC

## 2017-12-09 NOTE — Care Management Note (Signed)
Case Management Note  Patient Details  Name: Caitlin [name] MRN: [id number] Date of Birth: 1941-01-01  Subjective/Objective: 77 yo F s/p IM retrograde femoral nailing R proximal femur, removal interlocking screw on L hip (bilateral)                   Action/Plan: HH and DME   Expected Discharge Date:  12/09/17               Expected Discharge Plan:  [place]  In-House Referral:     Discharge planning Services  CM Consult  Post Acute Care Choice:    Choice offered to:     DME Arranged:    DME Agency:     HH Arranged:    [place] Agency:  [facility]  Status of Service:  Completed, signed off  If discussed at [place] of Stay Meetings, dates discussed:    Additional Comments: Met with pt and husband. Pt plans to return home with the support of her husband. Husband reports that he bought a RW. She is active with [facility] for [facility]. Per pt, she is going to f/u with her PCP for PT/INR lab.  Norina Buzzard, RN 12/09/2017, 12:18 PM

## 2017-12-09 NOTE — Evaluation (Signed)
Physical Therapy Evaluation Patient Details Name: Caitlin [name] MRN: [id number] DOB: 1940/11/25 Today's Date: 12/09/2017   History of Present Illness  77 YO admitted with stress fracture  proximal femur, s/P IMN, retained screws  from distal left femur  Clinical Impression  The patient is progressing well. Tolerating weight and r knee flexion on right. Plans DC today.   Follow Up Recommendations Home health PT    Equipment Recommendations  None recommended by PT    Recommendations for Other Services       Precautions / Restrictions Precautions Precautions: Fall      Mobility  Bed Mobility Overal bed mobility: Needs Assistance Bed Mobility: Supine to Sit     Supine to sit: Min guard        Transfers Overall transfer level: Needs assistance Equipment used: Rolling walker (2 wheeled) Transfers: Sit to/from Stand Sit to Stand: Min assist         General transfer comment: from bed and toilet  Ambulation/Gait Ambulation/Gait assistance: Min assist Gait Distance (Feet): 70 Feet Assistive device: Rolling walker (2 wheeled) Gait Pattern/deviations: Step-to pattern;Step-through pattern;Antalgic     General Gait Details: cues for sequence  Stairs Stairs: Yes Stairs assistance: Min assist Stair Management: Two rails;Step to pattern;Forwards Number of Stairs: 2 General stair comments: cues for sequence  Wheelchair Mobility    Modified Rankin (Stroke Patients Only)       Balance                                             Pertinent Vitals/Pain Pain Assessment: 0-10 Pain Score: 3  Pain Location: right thigh Pain Descriptors / Indicators: Discomfort;Sore Pain Intervention(s): Premedicated before session;Monitored during session;Ice applied    Home Living Family/patient expects to be discharged to:: Private residence Living Arrangements: Spouse/significant other Available Help at Discharge: Family;Available 24 hours/day Type of Home:  House Home Access: Stairs to enter Entrance Stairs-Rails: Left;Right;Can reach both Entrance Stairs-Number of Steps: 3 Home Layout: One level Home Equipment: Shower seat - built in;Walker - 2 wheels;Cane - single point;Hand held shower head      Prior Function Level of Independence: Independent with assistive device(s)         Comments: uses RW     Hand Dominance        Extremity/Trunk Assessment   Upper Extremity Assessment Upper Extremity Assessment: Overall WFL for tasks assessed    Lower Extremity Assessment Lower Extremity Assessment: RLE deficits/detail RLE Deficits / Details: knee flexion to 70       Communication      Cognition Arousal/Alertness: Awake/alert Behavior During Therapy: WFL for tasks assessed/performed Overall Cognitive Status: Within Functional Limits for tasks assessed                                        General Comments      Exercises     Assessment/Plan    PT Assessment Patient needs continued PT services  PT Problem List Decreased strength;Decreased range of motion;Decreased activity tolerance;Decreased knowledge of use of DME;Decreased safety awareness;Decreased mobility;Decreased knowledge of precautions       PT Treatment Interventions DME instruction;Therapeutic exercise;Gait training;Stair training;Functional mobility training;Therapeutic activities;Patient/family education    PT Goals (Current goals can be found in the Care Plan section)  Acute Rehab  PT Goals Patient Stated Goal: go to [place] PT Goal Formulation: With patient Time For Goal Achievement: 12/11/17 Potential to Achieve Goals: Good    Frequency 7X/week   Barriers to discharge        Co-evaluation               AM-PAC PT "6 Clicks" Daily Activity  Outcome Measure Difficulty turning over in bed (including adjusting bedclothes, sheets and blankets)?: A Little Difficulty moving from lying on back to sitting on the side of the  bed? : A Little Difficulty sitting down on and standing up from a chair with arms (e.g., wheelchair, bedside commode, etc,.)?: A Little Help needed moving to and from a bed to chair (including a wheelchair)?: A Little Help needed walking in hospital room?: A Little Help needed climbing 3-5 steps with a railing? : A Lot 6 Click Score: 17    End of Session   Activity Tolerance: Patient tolerated treatment well Patient left: in chair Nurse Communication: Mobility status PT Visit Diagnosis: Unsteadiness on feet (R26.81);Pain Pain - Right/Left: Right Pain - part of body: Leg    Time: 9030-0923 PT Time Calculation (min) (ACUTE ONLY): 29 min   Charges:   PT Evaluation $PT Eval Low Complexity: 1 Low PT Treatments $Gait Training: 8-22 mins   PT G CodesTresa Endo PT [phone]   Claretha Cooper 12/09/2017, 3:57 PM

## 2017-12-09 NOTE — Progress Notes (Signed)
ANTICOAGULATION CONSULT NOTE - Initial Consult  Pharmacy Consult for Warfarin Indication: Hx PE, VTE prophylaxis  No Known Allergies  Patient Measurements: Height: 5\' 2"  (157.5 cm) Weight: 165 lb (74.8 kg) IBW/kg (Calculated) : 50.1  Vital Signs: Temp: 98 F (36.7 C) (07/13 0444) Temp Source: Oral (07/13 0444) BP: 99/51 (07/13 0444) Pulse Rate: 67 (07/13 0444)  Labs: Recent Labs    12/08/17 1057 12/09/17 0508  HGB  --  9.3*  HCT  --  29.8*  PLT  --  524*  APTT 34  --   LABPROT 13.9  --   INR 1.07  --   CREATININE  --  1.54*    Estimated Creatinine Clearance: 28.5 mL/min (A) (by C-G formula based on SCr of 1.54 mg/dL (H)).   Medical History: Past Medical History:  Diagnosis Date  . Anemia   . Arthritis   . 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 Bladder cancer ([place])   . Breast cancer ([place]) 1985   left breast, lumpectomy and radiation  . Chronic kidney disease    does not empty bladder,self cath, Stage 3 CKD  . Complication of anesthesia   . GERD (gastroesophageal reflux disease)   . History of hiatal hernia   . History of radiation therapy 05/02/16-06/07/16   pelvis 55 Gy in 25 fractions  . Hypertension   . IBS (irritable bowel syndrome)    more diarrhea than constipation  . Mitral valve prolapse   . Personal history of radiation therapy 2005  . PONV (postoperative nausea and vomiting)   . Pulmonary embolus ([facility])    x 2 after surgery January 2019  . Restless legs   . Self-catheterizes urinary bladder     Medications:  Scheduled:  . acetaminophen  1,000 mg Oral Q6H  . docusate sodium  100 mg Oral BID  . furosemide  20 mg Oral Daily  . irbesartan  75 mg Oral Daily  . pantoprazole  40 mg Oral Daily   Infusions:  . dextrose 5 % and 0.45% NaCl 75 mL/hr (12/08/17 1652)  . methocarbamol (ROBAXIN)  IV 500 mg (12/08/17 1543)    Assessment: 62 yoF admitted on 7/12 for femur fracture repair.  PMH is significant for PTA Xarelto for history of  bilateral PE after surgery in January 2019.  Last dose taken on 7/10 prior to procedure.  SCr 1.85 elevated on presentation, and now improved to 1.54, but CrCl remains < 30 ml/min for Xarelto use.  Pharmacy is consulted to dose Warfarin.  Bridge with only prophylactic Lovenox given recent surgery and concern for bleeding.  Baseline INR 1.07 on 12/08/17 CBC:  Hgb 9.3, Plt 524 No bleeding documented No major drug-drug interactions   Goal of Therapy:  INR 2-3 Monitor platelets by anticoagulation protocol: Yes   Plan:   Warfarin 5 mg PO x 1 dose now (prior to discharge)  Daily PT/INR.  Monitor for signs and symptoms of bleeding.  Educate prior to discharge.  Lovenox 30mg  SQ daily per ortho   Recommendations for discharge:  Warfarin 2.5 mg PO daily with next dose on 7/14.  Recheck INR no later than Wednesday, 7/17   Gretta Arab PharmD, BCPS Pager [phone] 12/09/2017 9:28 AM

## 2017-12-09 NOTE — Progress Notes (Signed)
   PATIENT ID: Caitlin [name]   1 Day Post-Op Procedure(s) (LRB): INTRAMEDULLARY (IM) RETROGRADE FEMORAL NAILING RIGHT PROXIMAL FEMUR,REMOVAL INTERLOCKING SCREW ON LEFT HIP (Bilateral)  Subjective: Doing well, walking the halls with PT, about to try stairs. Min discomfort in the hip. Hopes to go home today. Was on xalerto for anticoagulation after hx of PE x 2.   Objective:  Vitals:   12/09/17 0444 12/09/17 0934  BP: (!) 99/51 118/60  Pulse: 67 65  Resp: 16 16  Temp: 98 F (36.7 C) 98.4 F (36.9 C)  SpO2: 98% 100%     R LE dressing c/d/i Wiggles toes, distally NVI Calves soft and nontender  Labs:  Recent Labs    12/09/17 0508  HGB 9.3*   Recent Labs    12/09/17 0508  WBC 10.1  RBC 3.03*  HCT 29.8*  PLT 524*   Recent Labs    12/09/17 0508  NA 139  K 4.6  CL 105  CO2 24  BUN 29*  CREATININE 1.54*  GLUCOSE 167*  CALCIUM 9.0    Assessment and Plan: 2 days s/p R hip IM femoral nail  Up with PT, WBAT Ready to go home today Per pharmacy CrCl too high to resume xalerto PCP on Call at [facility] rec coumadin to DVT prophylaxis long term after d/c, follow up at office Wednesday for INR check, pharmacy rec 72md dose x1 then 2.5 daily, lovenox bridge x 5 days, scripts will be signed soon Fu with Dr. Berenice Primas

## 2017-12-09 NOTE — Progress Notes (Signed)
Physical Therapy Treatment Patient Details Name: Caitlin [name] MRN: [id number] DOB: Jul 16, 1940 Today's Date: 12/09/2017    History of Present Illness 77 YO admitted with stress fracture  proximal femur, s/P IMN, retained screws  from distal left femur    PT Comments    Plans DC today.   Follow Up Recommendations  Home health PT     Equipment Recommendations  None recommended by PT    Recommendations for Other Services       Precautions / Restrictions Precautions Precautions: Fall    Mobility  Bed Mobility Overal bed mobility: Needs Assistance Bed Mobility: Supine to Sit     Supine to sit: Min guard Sit to supine: Supervision   General bed mobility comments: self assisted  right leg  Transfers Overall transfer level: Needs assistance Equipment used: Rolling walker (2 wheeled) Transfers: Sit to/from Stand Sit to Stand: Min guard         General transfer comment: from bed and toilet  Ambulation/Gait Ambulation/Gait assistance: Min guard Gait Distance (Feet): 30 Feet Assistive device: Rolling walker (2 wheeled) Gait Pattern/deviations: Step-to pattern;Step-through pattern;Antalgic     General Gait Details: cues for sequence   Stairs Stairs: Yes Stairs assistance: Min assist Stair Management: Two rails;Step to pattern;Forwards Number of Stairs: 2 General stair comments: cues for sequence   Wheelchair Mobility    Modified Rankin (Stroke Patients Only)       Balance                                            Cognition Arousal/Alertness: Awake/alert Behavior During Therapy: WFL for tasks assessed/performed Overall Cognitive Status: Within Functional Limits for tasks assessed                                        Exercises Total Joint Exercises Ankle Circles/Pumps: AROM;Both Quad Sets: AROM;Both Short Arc Quad: AROM;Right Heel Slides: AAROM;Right Hip ABduction/ADduction: AAROM;Right    General Comments         Pertinent Vitals/Pain Pain Assessment: 0-10 Pain Score: 1  Pain Location: right thigh Pain Descriptors / Indicators: Discomfort;Sore Pain Intervention(s): Monitored during session    Home Living Family/patient expects to be discharged to:: Private residence Living Arrangements: Spouse/significant other Available Help at Discharge: Family;Available 24 hours/day Type of Home: House Home Access: Stairs to enter Entrance Stairs-Rails: Left;Right;Can reach both Home Layout: One level Home Equipment: Shower seat - built in;Walker - 2 wheels;Cane - single point;Hand held shower head      Prior Function Level of Independence: Independent with assistive device(s)      Comments: uses RW   PT Goals (current goals can now be found in the care plan section) Acute Rehab PT Goals Patient Stated Goal: go to [place] PT Goal Formulation: With patient Time For Goal Achievement: 12/11/17 Potential to Achieve Goals: Good Progress towards PT goals: Progressing toward goals    Frequency    7X/week      PT Plan Current plan remains appropriate    Co-evaluation              AM-PAC PT "6 Clicks" Daily Activity  Outcome Measure  Difficulty turning over in bed (including adjusting bedclothes, sheets and blankets)?: A Little Difficulty moving from lying on back to sitting on the side of  the bed? : A Little Difficulty sitting down on and standing up from a chair with arms (e.g., wheelchair, bedside commode, etc,.)?: A Little Help needed moving to and from a bed to chair (including a wheelchair)?: A Little Help needed walking in hospital room?: A Little Help needed climbing 3-5 steps with a railing? : A Lot 6 Click Score: 17    End of Session   Activity Tolerance: Patient tolerated treatment well Patient left: in bed;with call bell/phone within reach Nurse Communication: Mobility status PT Visit Diagnosis: Unsteadiness on feet (R26.81);Pain Pain - Right/Left: Right Pain -  part of body: Leg     Time: 0940-1000 PT Time Calculation (min) (ACUTE ONLY): 20 min  Charges:  $Gait Training: 8-22 mins $Therapeutic Exercise: 8-22 mins                    G Codes:          Claretha Cooper 12/09/2017, 4:01 PM

## 2017-12-09 NOTE — Progress Notes (Signed)
lovenox teaching done with patient and husband. Return demonstration with husband. alos discussed coumadin with patient. Bethann Punches RN

## 2017-12-10 NOTE — Anesthesia Postprocedure Evaluation (Signed)
Anesthesia Post Note  Patient: Caitlin [name]  Procedure(s) Performed: INTRAMEDULLARY (IM) RETROGRADE FEMORAL NAILING RIGHT PROXIMAL FEMUR,REMOVAL INTERLOCKING SCREW ON LEFT HIP (Bilateral Hip)     Patient location during evaluation: PACU Anesthesia Type: General Level of consciousness: sedated Pain management: pain level controlled Vital Signs Assessment: post-procedure vital signs reviewed and stable Respiratory status: spontaneous breathing Cardiovascular status: stable Postop Assessment: no apparent nausea or vomiting Anesthetic complications: no    Last Vitals:  Vitals:   12/09/17 0934 12/09/17 1331  BP: 118/60 (!) 113/44  Pulse: 65 70  Resp: 16 17  Temp: 36.9 C 36.7 C  SpO2: 100% 100%    Last Pain:  Vitals:   12/09/17 1331  TempSrc: Oral  PainSc:    Pain Goal: Patients Stated Pain Goal: 2 (12/09/17 1300)               Seniyah Esker JR,JOHN Mateo Flow

## 2017-12-11 ENCOUNTER — Ambulatory Visit: Payer: Medicare Other | Admitting: Oncology

## 2017-12-11 ENCOUNTER — Encounter (HOSPITAL_COMMUNITY): Payer: Self-pay | Admitting: Orthopedic Surgery

## 2017-12-11 ENCOUNTER — Telehealth: Payer: Self-pay | Admitting: Oncology

## 2017-12-11 NOTE — Telephone Encounter (Signed)
Called pt re appt that was rescheduled per 7/15 sch msg - spoke w/ pt re appts

## 2017-12-12 LAB — BPAM RBC
BLOOD PRODUCT EXPIRATION DATE: 201907242359
Blood Product Expiration Date: 201907292359
UNIT TYPE AND RH: 600
Unit Type and Rh: 600

## 2017-12-12 LAB — TYPE AND SCREEN
ABO/RH(D): A NEG
Antibody Screen: POSITIVE
Donor AG Type: NEGATIVE
Donor AG Type: NEGATIVE
Unit division: 0
Unit division: 0

## 2017-12-12 NOTE — Op Note (Signed)
NAME: Caitlin [name], [place] MEDICAL RECORD [id number] ACCOUNT [id number] DATE OF BIRTH:01-16-1941 FACILITY: [facility] LOCATION: [place], [place]  OPERATIVE REPORT  DATE OF PROCEDURE:  12/08/2017  PREOPERATIVE DIAGNOSES:   1.  Stress fracture, right femur. 2.  Delayed union, left femur fracture.  POSTOPERATIVE DIAGNOSES:   1.  Stress fracture, right femur. 2.  Delayed union, left femur fracture.  PROCEDURE: 1.  Deep hardware removal, left femur from a femoral nonunion to afford compression. 2.  Intramedullary rod fixation of right femur fracture, right femoral stress fracture. 3.  Interpretation of multiple intraoperative fluoroscopic images.  SURGEON:  Dorna Leitz, MD  ASSISTANT:  Gaspar Skeeters, PA-C.  ANESTHESIA:  General.  BRIEF HISTORY:  The patient is a 77 year old female with a long history of bilateral femoral pain.  She has been on long-term bisphosphonate therapy and x-rays showed stress fractures bilaterally.  We are trying to treat her with decreased weightbearing,  but unfortunately she fractured her left femur.  It was a very complex and difficult case.  We ultimately got reasonable reduction with an intramedullary rod, but unfortunately she went on to have essentially signs of nonunion at 7 months out.  She was  continuing to have right leg pain and we felt that it was going to be better to try to address this before she fractured and she was brought to the operating room for intramedullary rod fixation of the right femur.  We felt that screw removal of the left  rod may allow some slight compression across the fracture and may help this to go on to heal and she was brought to the operating room for these procedures.  PROCEDURE:  The patient was brought to the operating room after adequate anesthesia obtained with general anesthetic.  DESCRIPTION OF PROCEDURE:  The patient was brought to the operating table.  We then turned to the left leg where after routine  prep and drape, incision was made through her old incisions and the 2 screws were removed under fluoroscopic guidance.  Final  images were taken to ensure that we removed the screws.  The wounds were irrigated and suctioned dry, closed in layers.  A sterile compressive dressing applied.  The patient was then placed on the Hana bed with that left leg abducted in externally  rotated position.  We then turned to the right leg, which was prepped in the usual sterile fashion and following this, an incision was made just proximal to the hip.  We dissected down to the level of the greater trochanter and an awl was used to put a  guide wire into the central part of the femur on both AP and lateral fluoroscopy.  An introductory reamer was then used and a guide wire was then passed down to the knee.  A rod was then measured, the length was measured and then the femur was reamed.   We reamed her to a 10.5 mm level and then put a 9 mm rod in to help stabilize the fracture.  Once the rod was placed in, a screw was placed up into the center-center portion of the head.  A large compressive screw and then a single screw was placed  distally.  Fluoroscopy was used throughout to make sure that we had good alignment and screw lengths.  At this point, the wounds were irrigated and suctioned dry, closed in layers.  Sterile compressive dressing was applied.  The patient was taken to  recovery was noted to be in satisfactory  condition.  Estimated blood loss for both procedures was about 100 mL with the final can be gotten from the anesthetic record.  TN/NUANCE  D:12/12/2017 T:12/12/2017 JOB:[id number]

## 2017-12-12 NOTE — Brief Op Note (Signed)
12/08/2017  12:56 PM  PATIENT:  Caitlin [name]  77 y.o. female  PRE-OPERATIVE DIAGNOSIS:  STRESS FRACTURE RIGHT PROXIMAL FEMUR, RETAINED LEFT DISTAL INTERLOCKING SCREW  POST-OPERATIVE DIAGNOSIS:  STRESS FRACTURE RIGHT PROXIMAL FEMUR, RETAINED DISTAL INTERLOCKING SCREW    PROCEDURE:  Procedure(s): INTRAMEDULLARY (IM) RETROGRADE FEMORAL NAILING RIGHT PROXIMAL FEMUR,REMOVAL INTERLOCKING SCREW ON LEFT HIP (Bilateral)  SURGEON:  Surgeon(s) and Role:    Dorna Leitz, MD - Primary  PHYSICIAN ASSISTANT:   ASSISTANTS: jim bethune   ANESTHESIA:   general  EBL:  100 mL   BLOOD ADMINISTERED:none  DRAINS: none   LOCAL MEDICATIONS USED:  MARCAINE     SPECIMEN:  No Specimen  DISPOSITION OF SPECIMEN:  N/A  COUNTS:  YES  TOURNIQUET:  * No tourniquets in log *  DICTATION: .Other Dictation: Dictation Number [id number]  PLAN OF CARE: Admit for overnight observation  PATIENT DISPOSITION:  PACU - hemodynamically stable.   Delay start of Pharmacological VTE agent (>24hrs) due to surgical blood loss or risk of bleeding: no

## 2017-12-12 NOTE — Discharge Summary (Signed)
Patient ID: Caitlin [name] MRN: [id number] DOB/AGE: Mar 12, 1941 77 y.o.  Admit date: 12/08/2017 Discharge date: 12/09/2017  Admission Diagnoses:  Principal Problem:   Stress fracture of right femur Active Problems:   Retained orthopedic hardware   Discharge Diagnoses:  Same  Past Medical History:  Diagnosis Date  . Anemia   . Arthritis   . 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 Bladder cancer ([facility])   . Breast cancer ([place]) 1985   left breast, lumpectomy and radiation  . Chronic kidney disease    does not empty bladder,self cath, Stage 3 CKD  . Complication of anesthesia   . GERD (gastroesophageal reflux disease)   . History of hiatal hernia   . History of radiation therapy 05/02/16-06/07/16   pelvis 55 Gy in 25 fractions  . Hypertension   . IBS (irritable bowel syndrome)    more diarrhea than constipation  . Mitral valve prolapse   . Personal history of radiation therapy 2005  . PONV (postoperative nausea and vomiting)   . Pulmonary embolus ([facility])    x 2 after surgery January 2019  . Restless legs   . Self-catheterizes urinary bladder     Surgeries: Procedure(s): INTRAMEDULLARY (IM) RETROGRADE FEMORAL NAILING RIGHT PROXIMAL FEMUR,REMOVAL INTERLOCKING SCREW ON LEFT HIP on 12/08/2017   Consultants:   Discharged Condition: Improved  Hospital Course: NILSA MACHT is an 77 y.o. female who was admitted 12/08/2017 for operative treatment ofStress fracture of right femur. Patient has severe unremitting pain that affects sleep, daily activities, and work/hobbies. After pre-op clearance the patient was taken to the operating room on 12/08/2017 and underwent  Procedure(s): INTRAMEDULLARY (IM) RETROGRADE FEMORAL NAILING RIGHT PROXIMAL FEMUR,REMOVAL INTERLOCKING SCREW ON LEFT HIP.    Patient was given perioperative antibiotics:  Anti-infectives (From admission, onward)   Start     Dose/Rate Route Frequency Ordered Stop   12/08/17 1930  ceFAZolin (ANCEF) IVPB 2g/100 mL premix     2  g 200 mL/hr over 30 Minutes Intravenous Every 6 hours 12/08/17 1628 12/09/17 0217   12/08/17 1045  ceFAZolin (ANCEF) IVPB 2g/100 mL premix     2 g 200 mL/hr over 30 Minutes Intravenous On call to O.R. 12/08/17 1030 12/08/17 1331       Patient was given sequential compression devices, early ambulation, and chemoprophylaxis to prevent DVT.  Patient benefited maximally from hospital stay and there were no complications.    Recent vital signs: No data found.   Recent laboratory studies: No results for input(s): WBC, HGB, HCT, PLT, NA, K, CL, CO2, BUN, CREATININE, GLUCOSE, INR, CALCIUM in the last 72 hours.  Invalid input(s): PT, 2   Discharge Medications:   Allergies as of 12/09/2017   No Known Allergies     Medication List    TAKE these medications   acetaminophen 500 MG tablet Commonly known as:  TYLENOL Take 1,000 mg by mouth daily.   BENGAY ULTRA STRENGTH EX Apply 1 application topically as needed (hip pain).   CALCIUM 1200+D3 PO Take 1,200 mg by mouth daily.   cetirizine 10 MG tablet Commonly known as:  ZYRTEC Take 10 mg by mouth daily.   cholecalciferol 1000 units tablet Commonly known as:  VITAMIN D Take 1 tablet by mouth.   enoxaparin 30 MG/0.3ML injection Commonly known as:  LOVENOX Inject 0.3 mLs (30 mg total) into the skin daily for 5 days.   escitalopram 20 MG tablet Commonly known as:  LEXAPRO Take 20 mg by mouth daily.  fluticasone 50 MCG/ACT nasal spray Commonly known as:  FLONASE Place 1 spray into the nose daily as needed for allergies.   furosemide 20 MG tablet Commonly known as:  LASIX Take 20 mg by mouth daily.   HYDROcodone-acetaminophen 5-325 MG tablet Commonly known as:  NORCO/VICODIN Take 1-2 tablets by mouth every 4 (four) hours as needed for moderate pain. What changed:  when to take this   loperamide 2 MG capsule Commonly known as:  IMODIUM Take 2 mg by mouth as needed for diarrhea or loose stools.   omeprazole 20 MG  capsule Commonly known as:  PRILOSEC Take 20 mg by mouth daily.   potassium chloride 10 MEQ tablet Commonly known as:  K-DUR Take 10 mEq by mouth daily.   prochlorperazine 10 MG tablet Commonly known as:  COMPAZINE Take 1 tablet (10 mg total) by mouth every 6 (six) hours as needed for nausea or vomiting.   Rivaroxaban 15 & 20 MG Tbpk Start with one 15mg  tablet by mouth twice a day with food. On Day 22, switch to one 20mg  tablet once a day with food. What changed:  Another medication with the same name was removed. Continue taking this medication, and follow the directions you see here.   telmisartan 20 MG tablet Commonly known as:  MICARDIS Take 20 mg by mouth daily.   tiZANidine 2 MG tablet Commonly known as:  ZANAFLEX Take 2 mg by mouth as needed for muscle spasms.   VITAMIN B1-B12 IJ Inject as directed every 30 (thirty) days.   warfarin 2.5 MG tablet Commonly known as:  COUMADIN Take 1 tablet (2.5 mg total) by mouth daily at 6 PM.       Diagnostic Studies: Dg Knee 1-2 Views Left  Result Date: 12/08/2017 CLINICAL DATA:  Removal of 2 screws from distal femoral prosthesis. FLUOROSCOPY TIME:  1.4 seconds. Images: 1 EXAM: LEFT KNEE - 1-2 VIEW COMPARISON:  December 08, 2017 FINDINGS: A single image demonstrates no distal interlocking screws. The patient is status post knee replacement. A femoral rod remains in place. IMPRESSION: No distal interlocking femoral rod screws noted. Electronically Signed   By: Dorise Bullion III M.D   On: 12/08/2017 15:34   Dg C-arm 1-60 Min-no Report  Result Date: 12/08/2017 Fluoroscopy was utilized by the requesting physician.  No radiographic interpretation.   Dg C-arm 1-60 Min-no Report  Result Date: 12/08/2017 Fluoroscopy was utilized by the requesting physician.  No radiographic interpretation.   Dg Femur, Min 2 Views Right  Result Date: 12/08/2017 CLINICAL DATA:  Right hip repair. FLUOROSCOPY TIME:  77 seconds. Images: 4 EXAM: RIGHT FEMUR 2  VIEWS COMPARISON:  None. FINDINGS: A gamma nail and intramedullary rod are seen proximally in the femur. A distal interlocking screw is noted. Hardware is in good position. IMPRESSION: Right femoral/hip hardware as above. Electronically Signed   By: Dorise Bullion III M.D   On: 12/08/2017 15:36    Disposition:   Discharge Instructions    Call MD / Call 911   Complete by:  As directed    If you experience chest pain or shortness of breath, CALL 911 and be transported to the hospital emergency room.  If you develope a fever above 101 F, pus (white drainage) or increased drainage or redness at the wound, or calf pain, call your surgeon's office.   Constipation Prevention   Complete by:  As directed    Drink plenty of fluids.  Prune juice may be helpful.  You may use  a stool softener, such as Colace (over the counter) 100 mg twice a day.  Use MiraLax (over the counter) for constipation as needed.   Diet - low sodium heart healthy   Complete by:  As directed    Increase activity slowly as tolerated   Complete by:  As directed       Follow-up Information    Dorna Leitz, MD. Schedule an appointment as soon as possible for a visit in 2 weeks.   Specialty:  Orthopedic Surgery Contact information: [place] [address] [phone]        Prince Solian, MD. Schedule an appointment as soon as possible for a visit on 12/13/2017.   Specialty:  Internal Medicine Contact information: [address] [place] [address] [phone]            Signed: Grier Mitts 12/12/2017, 9:49 AM

## 2017-12-25 ENCOUNTER — Ambulatory Visit
Admission: RE | Admit: 2017-12-25 | Discharge: 2017-12-25 | Disposition: A | Discharge status: Home / Self Care | Payer: Medicare Other | Source: Ambulatory Visit | Attending: Internal Medicine | Admitting: Internal Medicine

## 2017-12-25 DIAGNOSIS — Z1231 Encounter for screening mammogram for malignant neoplasm of breast: Secondary | ICD-10-CM

## 2017-12-28 ENCOUNTER — Telehealth: Payer: Self-pay | Admitting: Nurse Practitioner

## 2017-12-28 ENCOUNTER — Encounter: Payer: Self-pay | Admitting: Nurse Practitioner

## 2017-12-28 ENCOUNTER — Inpatient Hospital Stay: Payer: Medicare Other | Attending: Oncology | Admitting: Nurse Practitioner

## 2017-12-28 VITALS — BP 127/60 | HR 76 | Temp 97.9°F | Resp 18 | Ht 62.0 in | Wt 167.5 lb

## 2017-12-28 DIAGNOSIS — Z923 Personal history of irradiation: Secondary | ICD-10-CM | POA: Diagnosis not present

## 2017-12-28 DIAGNOSIS — Z86711 Personal history of pulmonary embolism: Secondary | ICD-10-CM | POA: Insufficient documentation

## 2017-12-28 DIAGNOSIS — Z8550 Personal history of malignant neoplasm of unspecified urinary tract organ: Secondary | ICD-10-CM | POA: Insufficient documentation

## 2017-12-28 DIAGNOSIS — Z8551 Personal history of malignant neoplasm of bladder: Secondary | ICD-10-CM | POA: Diagnosis not present

## 2017-12-28 DIAGNOSIS — Z86718 Personal history of other venous thrombosis and embolism: Secondary | ICD-10-CM

## 2017-12-28 DIAGNOSIS — Z8781 Personal history of (healed) traumatic fracture: Secondary | ICD-10-CM | POA: Diagnosis not present

## 2017-12-28 DIAGNOSIS — Z853 Personal history of malignant neoplasm of breast: Secondary | ICD-10-CM | POA: Diagnosis not present

## 2017-12-28 DIAGNOSIS — C680 Malignant neoplasm of urethra: Secondary | ICD-10-CM

## 2017-12-28 DIAGNOSIS — I10 Essential (primary) hypertension: Secondary | ICD-10-CM | POA: Insufficient documentation

## 2017-12-28 NOTE — Progress Notes (Addendum)
[facility] OFFICE PROGRESS NOTE   Diagnosis: Urothelial carcinoma  INTERVAL HISTORY:   Caitlin [name] returns as scheduled.  She has been hospitalized 3 times since her last office visit in January of this year.  Initial hospitalization 06/21/2017 through 06/23/2017 was due to a left femur fracture following a fall.  She presented on 06/30/2017 with shortness of breath.  She was found to have PE/DVT.  Most recently she was hospitalized 12/07/2017 through 12/09/2017 for a stress fracture of the right femur status post intramedullary nailing of the right proximal femur.  She also underwent removal of a screw on the left hip.  She feels she is slowly recovering from the most recent surgery.  She reports a good appetite.  She has not noticed any enlarged lymph nodes.  No nausea or vomiting.  She continues to self catheterize.  She has intermittent loose stools since undergoing gallbladder surgery.  Objective:  Vital signs in last 24 hours:  Blood pressure 127/60, pulse 76, temperature 97.9 F (36.6 C), temperature source Oral, resp. rate 18, height 5\' 2"  (1.575 m), weight 167 lb 8 oz (76 kg), SpO2 98 %.    HEENT: Neck without mass. Lymphatics: No palpable cervical, supraclavicular, axillary or inguinal lymph nodes. Resp: Lungs clear bilaterally. Cardio: Regular rate and rhythm. GI: Abdomen soft and nontender.  No hepatomegaly.  No mass. Vascular: No leg edema.   Lab Results:  Lab Results  Component Value Date   WBC 10.1 12/09/2017   HGB 9.3 (L) 12/09/2017   HCT 29.8 (L) 12/09/2017   MCV 98.3 12/09/2017   PLT 524 (H) 12/09/2017   NEUTROABS 6.5 12/05/2017    Imaging:  No results found.  Medications: I have reviewed the patient's current medications.  Assessment/Plan: 1.Bladder carcinoma, sarcomatoid urothelial carcinoma-status post trans-urethral resection of a left bladder wall tumor in September 2009 ? Cystectomy, bilateral pelvic lymphadenectomy, and ileal neobladder  04/21/2008-no residual malignancy identified ? 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 Chest x-ray 01/29/2016-negative for metastatic disease ? CT abdomen/pelvis 02/02/2016-enlarged right inguinal lymph node with a borderline adjacent right common femoral node ? Biopsyof right inguinal lymph node on 02/17/2016 confirming metastatic carcinoma, pathology review at [facility] found the inguinal lymph node biopsy to be consistent with metastatic carcinoma similar to the urethral mass. ? PET scan at [facility] on 03/31/2016 revealed right external iliac and inguinal hypermetabolic lymph nodes and hypermetabolic activity associated with a sclerotic lesion at C3. MRI 04/12/2016 consistent with degenerative changes at C3-C4 ? Initiation of radiation 05/02/2016; completion of radiation 06/08/2015 ? Cycle 1 5-FU/mitomycin C 05/03/2016 ? Cycle 2 5-FU/mitomycin-C 06/03/2015 ? Radiation completed 06/07/2016 ? CT pelvis 11/24/202018-persistent enlarged right inguinal node ? Ultrasound-guided biopsy of the right inguinal node 10/05/2016 revealed metastatic carcinoma with necrosis ? CT pelvis 11/21/2017-decrease in the size of the right inguinal lymph node ? CT pelvis 01/25/2017-no evidence of metastatic carcinoma  2.Breast cancer, left T1b, N0-2005, status post lumpectomy/radiation and Aromasin  3. Hypertension  4. Skin toxicity related to radiation-resolved  5.   PE/DVT February 2019  6.   Left femur fracture January 2019 status post surgery  7.   Stress fracture right femur status post IM nailing July 2019      Disposition: Caitlin [name] remains in clinical remission from urothelial carcinoma.  She continues follow-up with Dr. Sondra Come and is scheduled to see him again in November.  She  will return for a follow-up visit here in 8 months.  She will contact the  office in the interim with any problems.  Patient seen with Dr. Benay Spice.    Ned Card ANP/GNP-BC   12/28/2017  1:01 PM  This was a shared visit with Ned Card.  Caitlin [name] was interviewed and examined.  Julieanne Manson, MD

## 2017-12-28 NOTE — Telephone Encounter (Signed)
Scheduled appt per 8/1 los - gave patient AVS And calender per los

## 2018-04-05 ENCOUNTER — Ambulatory Visit: Payer: Medicare Other | Admitting: Radiation Oncology

## 2018-04-09 ENCOUNTER — Other Ambulatory Visit: Payer: Self-pay

## 2018-04-09 ENCOUNTER — Encounter: Payer: Self-pay | Admitting: Radiation Oncology

## 2018-04-09 ENCOUNTER — Ambulatory Visit
Admission: RE | Admit: 2018-04-09 | Discharge: 2018-04-09 | Disposition: A | Discharge status: Home / Self Care | Payer: Medicare Other | Source: Ambulatory Visit | Attending: Radiation Oncology | Admitting: Radiation Oncology

## 2018-04-09 VITALS — BP 164/82 | HR 76 | Temp 97.6°F | Resp 20 | Wt 163.6 lb

## 2018-04-09 DIAGNOSIS — Z79899 Other long term (current) drug therapy: Secondary | ICD-10-CM | POA: Diagnosis not present

## 2018-04-09 DIAGNOSIS — Z7901 Long term (current) use of anticoagulants: Secondary | ICD-10-CM | POA: Diagnosis not present

## 2018-04-09 DIAGNOSIS — C774 Secondary and unspecified malignant neoplasm of inguinal and lower limb lymph nodes: Secondary | ICD-10-CM | POA: Diagnosis present

## 2018-04-09 DIAGNOSIS — Z923 Personal history of irradiation: Secondary | ICD-10-CM | POA: Diagnosis not present

## 2018-04-09 NOTE — Progress Notes (Signed)
Radiation Oncology         [phone] ________________________________  Name: Caitlin [name] MRN: [id number]  Date: 04/09/2018  DOB: 06-04-1940  Follow-Up Visit Note  CC: Prince Solian, MD  Ladell Pier, MD    ICD-10-CM   1. 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  She is doing well overall.   She reports pain and unsteady gait following her femoral surgery in January. She is taking acetaminophen for relief. She denies vaginal and rectal bleeding. She recently underwent a cholecystectomy and reports subsequent diarrhea, for which she takes 1-2 imodium with some relief.         Since she was last seen in the office, she underwent a mammogram on 12/25/17 that showed no evidence of malignancy.          ALLERGIES:  has No Known Allergies.  Meds: Current Outpatient Medications  Medication Sig Dispense Refill  . acetaminophen (TYLENOL) 500 MG tablet Take 1,000 mg by mouth daily.     . cetirizine (ZYRTEC) 10 MG tablet Take 10 mg by mouth daily.    . fluticasone (FLONASE) 50 MCG/ACT nasal spray Place 1 spray into the nose daily as needed for allergies.     . furosemide (LASIX) 20 MG tablet Take 20 mg by mouth daily.  0  . loperamide (IMODIUM) 2 MG capsule Take 2 mg by mouth as needed for diarrhea or loose stools.    [name] omeprazole (PRILOSEC) 20 MG capsule Take 20 mg by mouth daily.     . potassium chloride (K-DUR) 10 MEQ tablet Take 10 mEq by mouth daily.   3  . VITAMIN B1-B12 IJ Inject as directed every 30 (thirty) days.    [name] warfarin  (COUMADIN) 2.5 MG tablet Take 1 tablet (2.5 mg total) by mouth daily at 6 PM. 7 tablet 1  . Calcium-Magnesium-Vitamin D (CALCIUM 1200+D3 PO) Take 1,200 mg by mouth daily.    . cholecalciferol (VITAMIN D) 1000 units tablet Take 1 tablet by mouth.     . enoxaparin (LOVENOX) 30 MG/0.3ML injection Inject 0.3 mLs (30 mg total) into the skin daily for 5 days. 1.5 mL 0  . escitalopram (LEXAPRO) 20 MG tablet Take 20 mg by mouth daily.     [name] HYDROcodone-acetaminophen (NORCO/VICODIN) 5-325 MG tablet Take 1-2 tablets by mouth every 4 (four) hours as needed for moderate pain. (Patient not taking: Reported on 04/09/2018) 30 tablet 0  . Menthol, Topical Analgesic, (BENGAY ULTRA STRENGTH EX) Apply 1 application topically as needed (hip pain).    . prochlorperazine (COMPAZINE) 10 MG tablet Take 1 tablet (10 mg total) by mouth every 6 (six) hours as needed for nausea or vomiting. (Patient not taking: Reported on 04/09/2018) 30 tablet 0  . Rivaroxaban 15 & 20 MG TBPK Start with one 15mg  tablet by mouth twice a day with food. On Day 22, switch to one 20mg  tablet once a day with food. (Patient not taking: Reported on 04/09/2018) 51 each 0  . tiZANidine (ZANAFLEX) 2 MG tablet Take 2 mg by mouth as needed for muscle spasms.  0   No current facility-administered medications for this encounter.     Physical Findings: The patient is in no acute distress. Patient is alert and oriented. sHe is ambulating with a cane in light of her recent orthopedic issues.  weight is 163 lb 9.6 oz (74.2 kg). Her oral temperature is 97.6 F (36.4 C). Her blood pressure is 164/82 (abnormal) and her pulse is 76. Her respiration is 20 and oxygen saturation is 100%.   Lungs are clear to auscultation bilaterally. Heart has regular rate and rhythm. No palpable cervical, supraclavicular, or axillary adenopathy. Abdomen soft, non-tender, normal bowel sounds. On pelvic examination the external genitalia were unremarkable. A speculum exam was  performed. There are no mucosal lesions noted in the vaginal vault. On bimanual and rectovaginal examination there were no pelvic masses appreciated. Careful exam of both inguinal areas reveals no palpable adenopathy.   Lab Findings: Lab Results  Component Value Date   WBC 10.1 12/09/2017   HGB 9.3 (L) 12/09/2017   HCT 29.8 (L) 12/09/2017   MCV 98.3 12/09/2017   PLT 524 (H) 12/09/2017    Radiographic Findings: No results found.  Impression:  No evidence of recurrence on clinical exam.   Plan:  Pt will see Dr. Benay Spice in January or February. Routine follow-up in radiation oncology in May of 2020.    ____________________________________  Blair Promise, PhD, MD  This document serves as a record of services personally performed by Gery Pray, MD. It was created on his behalf by Mary-Margaret Loma Messing, a trained medical scribe. The creation of this record is based on the scribe's personal observations and the provider's statements to them. This document has been checked and approved by the attending provider.

## 2018-04-09 NOTE — Progress Notes (Signed)
Ms. Brosious is here for her follow-up appointment . Patient denies any pain . Patient reports mild fatigue. Patient denies any vaginal or rectal bleeding. Patient denies any vaginal discharge.Patient denies any dysuria. Patient reports nocturia x 2. Patient states that she is taking  Imodium  For relief.  Patient states that she has  had this issue with diarrhea since her gallbladder came out. Patient denies any nausea or vomiting. Patient denies any issues with her skin at her radiation site. Vitals:   04/09/18 1602  BP: (!) 164/82  Pulse: 76  Resp: 20  Temp: 97.6 F (36.4 C)  TempSrc: Oral  SpO2: 100%  Weight: 163 lb 9.6 oz (74.2 kg)   Wt Readings from Last 3 Encounters:  04/09/18 163 lb 9.6 oz (74.2 kg)  12/28/17 167 lb 8 oz (76 kg)  12/08/17 165 lb (74.8 kg)

## 2018-04-11 ENCOUNTER — Telehealth: Payer: Self-pay | Admitting: *Deleted

## 2018-04-11 NOTE — Telephone Encounter (Signed)
CALLED PATIENT TO INFORM OF 6 MONTH FU ON 10-08-18 @ 11 AM , SPOKE WITH PT. AND SHE IS AWARE OF THIS APPT.

## 2018-08-31 ENCOUNTER — Telehealth: Payer: Self-pay | Admitting: *Deleted

## 2018-08-31 NOTE — Telephone Encounter (Signed)
Called patient to cancel her OV on 09/04/18 due to COVID 19 Precautions. She understands and agrees. Will be contacted for visit in 2 months. She reports she is doing well. Scheduling message sent.

## 2018-09-04 ENCOUNTER — Inpatient Hospital Stay: Payer: Medicare Other | Admitting: Oncology

## 2018-09-05 ENCOUNTER — Telehealth: Payer: Self-pay | Admitting: Oncology

## 2018-09-05 NOTE — Telephone Encounter (Signed)
Scheduled appt per 4/3 sch message - unable to reach patient . Left message and mailed reminder letter.

## 2018-09-13 ENCOUNTER — Emergency Department (HOSPITAL_COMMUNITY)
Admission: EM | Admit: 2018-09-13 | Discharge: 2018-09-13 | Disposition: A | Discharge status: Home / Self Care | Payer: Medicare Other | Attending: Emergency Medicine | Admitting: Emergency Medicine

## 2018-09-13 ENCOUNTER — Emergency Department (HOSPITAL_COMMUNITY): Payer: Medicare Other

## 2018-09-13 ENCOUNTER — Encounter (HOSPITAL_COMMUNITY): Payer: Self-pay

## 2018-09-13 DIAGNOSIS — N183 Chronic kidney disease, stage 3 (moderate): Secondary | ICD-10-CM | POA: Diagnosis not present

## 2018-09-13 DIAGNOSIS — Z7901 Long term (current) use of anticoagulants: Secondary | ICD-10-CM | POA: Insufficient documentation

## 2018-09-13 DIAGNOSIS — Z79899 Other long term (current) drug therapy: Secondary | ICD-10-CM | POA: Diagnosis not present

## 2018-09-13 DIAGNOSIS — Z8551 Personal history of malignant neoplasm of bladder: Secondary | ICD-10-CM | POA: Diagnosis not present

## 2018-09-13 DIAGNOSIS — Y929 Unspecified place or not applicable: Secondary | ICD-10-CM | POA: Insufficient documentation

## 2018-09-13 DIAGNOSIS — Z86718 Personal history of other venous thrombosis and embolism: Secondary | ICD-10-CM | POA: Diagnosis not present

## 2018-09-13 DIAGNOSIS — Y999 Unspecified external cause status: Secondary | ICD-10-CM | POA: Diagnosis not present

## 2018-09-13 DIAGNOSIS — Z86711 Personal history of pulmonary embolism: Secondary | ICD-10-CM | POA: Insufficient documentation

## 2018-09-13 DIAGNOSIS — Z87312 Personal history of (healed) stress fracture: Secondary | ICD-10-CM | POA: Diagnosis not present

## 2018-09-13 DIAGNOSIS — Y939 Activity, unspecified: Secondary | ICD-10-CM | POA: Diagnosis not present

## 2018-09-13 DIAGNOSIS — Z853 Personal history of malignant neoplasm of breast: Secondary | ICD-10-CM | POA: Diagnosis not present

## 2018-09-13 DIAGNOSIS — Z906 Acquired absence of other parts of urinary tract: Secondary | ICD-10-CM | POA: Insufficient documentation

## 2018-09-13 DIAGNOSIS — I129 Hypertensive chronic kidney disease with stage 1 through stage 4 chronic kidney disease, or unspecified chronic kidney disease: Secondary | ICD-10-CM | POA: Insufficient documentation

## 2018-09-13 DIAGNOSIS — M79651 Pain in right thigh: Secondary | ICD-10-CM | POA: Diagnosis not present

## 2018-09-13 DIAGNOSIS — S79921A Unspecified injury of right thigh, initial encounter: Secondary | ICD-10-CM | POA: Diagnosis present

## 2018-09-13 DIAGNOSIS — W19XXXA Unspecified fall, initial encounter: Secondary | ICD-10-CM

## 2018-09-13 DIAGNOSIS — W1830XA Fall on same level, unspecified, initial encounter: Secondary | ICD-10-CM | POA: Insufficient documentation

## 2018-09-13 LAB — CBC WITH DIFFERENTIAL/PLATELET
Abs Immature Granulocytes: 0.04 10*3/uL (ref 0.00–0.07)
Basophils Absolute: 0 10*3/uL (ref 0.0–0.1)
Basophils Relative: 0 %
Eosinophils Absolute: 0.2 10*3/uL (ref 0.0–0.5)
Eosinophils Relative: 2 %
HCT: 37.3 % (ref 36.0–46.0)
Hemoglobin: 11.6 g/dL — ABNORMAL LOW (ref 12.0–15.0)
Immature Granulocytes: 0 %
Lymphocytes Relative: 23 %
Lymphs Abs: 2.4 10*3/uL (ref 0.7–4.0)
MCH: 31.3 pg (ref 26.0–34.0)
MCHC: 31.1 g/dL (ref 30.0–36.0)
MCV: 100.5 fL — ABNORMAL HIGH (ref 80.0–100.0)
Monocytes Absolute: 0.6 10*3/uL (ref 0.1–1.0)
Monocytes Relative: 6 %
Neutro Abs: 7.1 10*3/uL (ref 1.7–7.7)
Neutrophils Relative %: 69 %
Platelets: 432 10*3/uL — ABNORMAL HIGH (ref 150–400)
RBC: 3.71 MIL/uL — ABNORMAL LOW (ref 3.87–5.11)
RDW: 13.9 % (ref 11.5–15.5)
WBC: 10.4 10*3/uL (ref 4.0–10.5)
nRBC: 0 % (ref 0.0–0.2)

## 2018-09-13 LAB — TYPE AND SCREEN
ABO/RH(D): A NEG
Antibody Screen: POSITIVE

## 2018-09-13 LAB — BASIC METABOLIC PANEL
Anion gap: 12 (ref 5–15)
BUN: 32 mg/dL — ABNORMAL HIGH (ref 8–23)
CO2: 18 mmol/L — ABNORMAL LOW (ref 22–32)
Calcium: 9.5 mg/dL (ref 8.9–10.3)
Chloride: 105 mmol/L (ref 98–111)
Creatinine, Ser: 1.83 mg/dL — ABNORMAL HIGH (ref 0.44–1.00)
GFR calc Af Amer: 30 mL/min — ABNORMAL LOW (ref >60)
GFR calc non Af Amer: 26 mL/min — ABNORMAL LOW (ref >60)
Glucose, Bld: 115 mg/dL — ABNORMAL HIGH (ref 70–99)
Potassium: 4.5 mmol/L (ref 3.5–5.1)
Sodium: 135 mmol/L (ref 135–145)

## 2018-09-13 LAB — PROTIME-INR
INR: 1 (ref 0.8–1.2)
Prothrombin Time: 12.7 seconds (ref 11.4–15.2)

## 2018-09-13 MED ORDER — LIDOCAINE 5 % EX PTCH
1.0000 | MEDICATED_PATCH | CUTANEOUS | Status: DC
  Administered 2018-09-13: 1 via TRANSDERMAL
  Filled 2018-09-13: qty 1

## 2018-09-13 MED ORDER — LIDOCAINE 5 % EX PTCH: 1.0000 | MEDICATED_PATCH | CUTANEOUS | 0 refills | Status: AC

## 2018-09-13 MED ORDER — TRAMADOL HCL 50 MG PO TABS: 50.0000 mg | ORAL_TABLET | Freq: Four times a day (QID) | ORAL | 0 refills | Status: DC | PRN

## 2018-09-13 MED ORDER — TRAMADOL HCL 50 MG PO TABS: 50.0000 mg | ORAL_TABLET | Freq: Four times a day (QID) | ORAL | 0 refills | Status: AC | PRN

## 2018-09-13 MED ORDER — HYDROCODONE-ACETAMINOPHEN 5-325 MG PO TABS
1.0000 | ORAL_TABLET | Freq: Once | ORAL | Status: AC
  Administered 2018-09-13: 1 via ORAL
  Filled 2018-09-13: qty 1

## 2018-09-13 MED ORDER — FENTANYL CITRATE (PF) 100 MCG/2ML IJ SOLN
50.0000 ug | Freq: Once | INTRAMUSCULAR | Status: AC
  Administered 2018-09-13: 50 ug via INTRAVENOUS
  Filled 2018-09-13: qty 2

## 2018-09-13 NOTE — ED Triage Notes (Signed)
Patient arrives via EMS c/o mechanical Fall. Pt is A&O x 4 on arrival; pt initial c/o right upper Femer pain but recieved 200 mcg Fent en route via EMS; pt rates pain at 0/10 on arrival; Pt hit head but denies LOC; pt arrives with C-collar applied; Pt normally independent from home with spouse; No other c/o; pt has hx of CA and self caths; Pt has been off coumadin for 1-2 months; No bleeding noted on arrival-Monique,RN

## 2018-09-13 NOTE — ED Notes (Signed)
Pt stated she was unable to stand to ambulate until she is given something stronger maybe through her IV, the pill is not working, she stated

## 2018-09-13 NOTE — ED Provider Notes (Signed)
[facility] EMERGENCY DEPARTMENT Provider Note   CSN: [id number] Arrival date & time: 09/13/18  1920    History   Chief Complaint Chief Complaint  Patient presents with   Fall   Hip Pain    HPI Caitlin [name] is a 78 y.o. female.     Patient with history of chronic kidney disease, PE provoked by surgery, breast cancer, bladder cancer, left femur fracture status post hardware removal in 7/19 due to nonunion and right femoral IM nail to treat R femoral stress fracture, Dr. Berenice Primas is her orthopedic surgeon -- presents to the emergency department with complaint of right anterior and lateral femoral pain after a fall.  Patient was getting up from a folding chair and fell to the ground. She subsequently had episodes of 'spasming' pain.  There is no associated lightheadedness, shortness of breath, chest pain.  Fall was reportedly mechanical in nature.  Patient lightly struck the left side of her head.  She was placed in a cervical collar by EMS and treated with 262mcg of fentanyl en route.  There was no loss of consciousness or trauma to the scalp.  Patient has been on Coumadin in the past but has been off for the past several months.  Patient complains of pain in her right femur area with movement.  No numbness or tingling distally.  Onset of symptoms acute.  Course is constant.     Past Medical History:  Diagnosis Date   Anemia    Arthritis    Bladder absent Pt had bladder removed in 2010   Bladder cancer ([place])    Breast cancer ([place]) 1985   left breast, lumpectomy and radiation   Chronic kidney disease    does not empty bladder,self cath, Stage 3 CKD   Complication of anesthesia    GERD (gastroesophageal reflux disease)    History of hiatal hernia    History of radiation therapy 05/02/16-06/07/16   pelvis 55 Gy in 25 fractions   Hypertension    IBS (irritable bowel syndrome)    more diarrhea than constipation   Mitral valve prolapse    Personal  history of radiation therapy 2005   PONV (postoperative nausea and vomiting)    Pulmonary embolus ([place])    x 2 after surgery January 2019   Restless legs    Self-catheterizes urinary bladder     Patient Active Problem List   Diagnosis Date Noted   Retained orthopedic hardware 12/08/2017   Stress fracture of right femur 12/07/2017   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 ([place]) 10/02/2017   Arthritis 07/03/2017   DVT (deep venous thrombosis) ([place]) 07/03/2017   Pulmonary embolism ([place]) 06/30/2017   Closed left subtrochanteric femur fracture ([place]) 06/22/2017   Femur fracture ([place]) 06/21/2017   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[place]) 04/01/2016   Urethral caruncle 01/26/2016   PAC (premature atrial contraction) 11/29/2012   HTN (hypertension) 10/10/2012    Past Surgical History:  Procedure Laterality Date   ABDOMINAL HYSTERECTOMY  1980   total   ANTERIOR AND POSTERIOR REPAIR N/A 01/25/2016   Procedure: Excision of suburethral mass;  Surgeon: Irine Seal, MD;  Location: [facility];  Service: Urology;  Laterality: N/A;   APPENDECTOMY     BLADDER REMOVAL     had neo bladder put in   BLADDER SURGERY  2009   due to cancer   BREAST BIOPSY Left 2005   BREAST LUMPECTOMY Left 2005   BREAST SURGERY  2005   Left  Breast Lumpectomy   CHOLECYSTECTOMY N/A 02/19/2013   Procedure: LAPAROSCOPIC CHOLECYSTECTOMY WITH INTRAOPERATIVE CHOLANGIOGRAM;  Surgeon: Ralene Ok, MD;  Location: [place];  Service: General;  Laterality: N/A;   COLONOSCOPY     FEMUR IM NAIL Left 06/21/2017   Procedure: INTRAMEDULLARY (IM) NAIL FEMORAL;  Surgeon: Dorna Leitz, MD;  Location: [place];  Service: Orthopedics;  Laterality: Left;   FEMUR IM NAIL Bilateral 12/08/2017   Procedure: INTRAMEDULLARY (IM) RETROGRADE FEMORAL NAILING RIGHT PROXIMAL FEMUR,REMOVAL INTERLOCKING SCREW ON LEFT HIP;  Surgeon: Dorna Leitz, MD;  Location: [facility];  Service: Orthopedics;  Laterality: Bilateral;     IR GENERIC HISTORICAL  05/03/2016   IR FLUORO GUIDE CV LINE RIGHT 05/03/2016 Corrie Mckusick, DO [facility]   IR GENERIC HISTORICAL  05/03/2016   IR US GUIDE VASC ACCESS RIGHT 05/03/2016 Corrie Mckusick, DO [facility]   IR GENERIC HISTORICAL  05/31/2016   IR FLUORO GUIDE CV LINE LEFT 05/31/2016 Sandi Mariscal, MD [facility]   IR GENERIC HISTORICAL  05/31/2016   IR US GUIDE VASC ACCESS LEFT 05/31/2016 Sandi Mariscal, MD [facility]   REPLACEMENT TOTAL KNEE Left    TONSILLECTOMY       OB History   No obstetric history on file.      Home Medications    Prior to Admission medications   Medication Sig Start Date End Date Taking? Authorizing Provider  acetaminophen (TYLENOL) 500 MG tablet Take 1,000 mg by mouth daily.     [provider]  Calcium-Magnesium-Vitamin D (CALCIUM 1200+D3 PO) Take 1,200 mg by mouth daily.    [provider]  cetirizine (ZYRTEC) 10 MG tablet Take 10 mg by mouth daily.    [provider]  cholecalciferol (VITAMIN D) 1000 units tablet Take 1 tablet by mouth.     [provider]  enoxaparin (LOVENOX) 30 MG/0.3ML injection Inject 0.3 mLs (30 mg total) into the skin daily for 5 days. 12/09/17 12/14/17  Grier Mitts, PA-C  escitalopram (LEXAPRO) 20 MG tablet Take 20 mg by mouth daily.     [provider]  fluticasone (FLONASE) 50 MCG/ACT nasal spray Place 1 spray into the nose daily as needed for allergies.  06/22/12   [provider]  furosemide (LASIX) 20 MG tablet Take 20 mg by mouth daily. 06/29/17   [provider]  HYDROcodone-acetaminophen (NORCO/VICODIN) 5-325 MG tablet Take 1-2 tablets by mouth every 4 (four) hours as needed for moderate pain. Patient not taking: Reported on 04/09/2018 12/09/17   Grier Mitts, PA-C  loperamide (IMODIUM) 2 MG capsule Take 2 mg by mouth as needed for diarrhea or loose stools.    [provider]  Menthol, Topical Analgesic, (BENGAY ULTRA STRENGTH EX) Apply  1 application topically as needed (hip pain).    [provider]  omeprazole (PRILOSEC) 20 MG capsule Take 20 mg by mouth daily.  06/25/12   [provider]  potassium chloride (K-DUR) 10 MEQ tablet Take 10 mEq by mouth daily.  07/06/17   [provider]  prochlorperazine (COMPAZINE) 10 MG tablet Take 1 tablet (10 mg total) by mouth every 6 (six) hours as needed for nausea or vomiting. Patient not taking: Reported on 04/09/2018 05/03/16   Ladell Pier, MD  Rivaroxaban 15 & 20 MG TBPK Start with one 15mg  tablet by mouth twice a day with food. On Day 22, switch to one 20mg  tablet once a day with food. Patient not taking: Reported on 04/09/2018 07/03/17   Dessa Phi, DO  tiZANidine (  ZANAFLEX) 2 MG tablet Take 2 mg by mouth as needed for muscle spasms.  09/21/17   [provider]  VITAMIN B1-B12 IJ Inject as directed every 30 (thirty) days.    [provider]  warfarin (COUMADIN) 2.5 MG tablet Take 1 tablet (2.5 mg total) by mouth daily at 6 PM. 12/10/17   Grier Mitts, PA-C    Family History Family History  Problem Relation Age of Onset   Stroke Mother    Stroke Father    Asthma Sister     Social History Social History   Tobacco Use   Smoking status: Never Smoker   Smokeless tobacco: Never Used  Substance Use Topics   Alcohol use: No   Drug use: No     Allergies   Patient has no known allergies.   Review of Systems Review of Systems  Constitutional: Negative for fever.  HENT: Negative for rhinorrhea and sore throat.   Eyes: Negative for redness.  Respiratory: Negative for cough and shortness of breath.   Cardiovascular: Negative for chest pain and leg swelling.  Gastrointestinal: Negative for abdominal pain, diarrhea, nausea and vomiting.  Genitourinary: Negative for dysuria.  Musculoskeletal: Positive for myalgias. Negative for arthralgias.  Skin: Negative for rash.  Neurological: Negative for headaches.      Physical Exam Updated Vital Signs BP (!) 140/121 (BP Location: Right Arm)    Pulse 73    Temp 98.2 F (36.8 C) (Oral)    Resp 14    Ht 5\' 2"  (1.575 m)    Wt 74.8 kg    SpO2 94%    BMI 30.18 kg/m   Physical Exam Vitals signs and nursing note reviewed.  Constitutional:      Appearance: She is well-developed.  HENT:     Head: Normocephalic and atraumatic.  Eyes:     General:        Right eye: No discharge.        Left eye: No discharge.     Conjunctiva/sclera: Conjunctivae normal.  Neck:     Musculoskeletal: Normal range of motion and neck supple.  Cardiovascular:     Rate and Rhythm: Normal rate and regular rhythm.     Pulses:          Dorsalis pedis pulses are 2+ on the right side and 2+ on the left side.     Heart sounds: Normal heart sounds.  Pulmonary:     Effort: Pulmonary effort is normal.     Breath sounds: Normal breath sounds.  Abdominal:     Palpations: Abdomen is soft.     Tenderness: There is no abdominal tenderness.  Musculoskeletal:        General: Tenderness present.     Comments: Patient is comfortable after pain medicine given by EMS.  She has some range of motion in her right hip but states that she has a "pulling feeling" when she moves her leg in the right anterior and lateral femur.  Skin:    General: Skin is warm and dry.  Neurological:     Mental Status: She is alert.      ED Treatments / Results  Labs (all labs ordered are listed, but only abnormal results are displayed) Labs Reviewed  BASIC METABOLIC PANEL - Abnormal; Notable for the following components:      Result Value   CO2 18 (*)    Glucose, Bld 115 (*)    BUN 32 (*)    Creatinine, Ser 1.83 (*)  GFR calc non Af Amer 26 (*)    GFR calc Af Amer 30 (*)    All other components within normal limits  CBC WITH DIFFERENTIAL/PLATELET - Abnormal; Notable for the following components:   RBC 3.71 (*)    Hemoglobin 11.6 (*)    MCV 100.5 (*)    Platelets 432 (*)    All other  components within normal limits  PROTIME-INR  TYPE AND SCREEN    ED ECG REPORT   Date: 09/13/2018  Rate: 67  Rhythm: normal sinus rhythm  QRS Axis: normal  Intervals: normal  ST/T Wave abnormalities: normal  Conduction Disutrbances:none  Narrative Interpretation:   Old EKG Reviewed: unchanged except improvement in ST segment abnormality.   I have personally reviewed the EKG tracing and agree with the computerized printout as noted.  Radiology Dg Cervical Spine Complete  Result Date: 09/13/2018 CLINICAL DATA:  Recent fall with neck pain, initial encounter EXAM: CERVICAL SPINE - COMPLETE 4+ VIEW COMPARISON:  CT from 02/08/2017 FINDINGS: Seven cervical segments are well visualized. Loss of the normal cervical lordosis is again identified stable from the prior exam. Mild anterolisthesis of C2 on C3 and C3 on C4 is again noted and stable. Facet hypertrophic changes are seen. No acute fracture or acute facet abnormality is noted. The odontoid is within normal limits. No prevertebral soft tissue abnormality is noted. IMPRESSION: Degenerative changes similar to that seen in 2018. No acute abnormality noted. Electronically Signed   By: Inez Catalina M.D.   On: 09/13/2018 21:07   Dg Pelvis 1-2 Views  Result Date: 09/13/2018 CLINICAL DATA:  Recent fall with hip pain, initial encounter EXAM: PELVIS - 1-2 VIEW COMPARISON:  None. FINDINGS: Pelvic ring is intact. Bilateral medullary rod with fixation screws are noted in the proximal femurs. No acute fracture or dislocation is noted. No soft tissue abnormality is seen. IMPRESSION: No acute abnormality noted. Electronically Signed   By: Inez Catalina M.D.   On: 09/13/2018 21:03   Dg Femur, Min 2 Views Right  Result Date: 09/13/2018 CLINICAL DATA:  Fall. EXAM: RIGHT FEMUR 2 VIEWS COMPARISON:  Intraoperative spot fluoro films from 12/08/2017. FINDINGS: Two-view exam shows dynamic hip screw with long intramedullary femoral rod. Single distal interlocking  screw again noted. No evidence for an acute fracture. No hardware complication. IMPRESSION: No acute bony abnormality. Electronically Signed   By: Misty Stanley M.D.   On: 09/13/2018 20:58    Procedures Procedures (including critical care time)  Medications Ordered in ED Medications  lidocaine (LIDODERM) 5 % 1 patch (1 patch Transdermal Patch Applied 09/13/18 2226)  HYDROcodone-acetaminophen (NORCO/VICODIN) 5-325 MG per tablet 1 tablet (1 tablet Oral Given 09/13/18 2142)  fentaNYL (SUBLIMAZE) injection 50 mcg (50 mcg Intravenous Given 09/13/18 2227)     Initial Impression / Assessment and Plan / ED Course  I have reviewed the triage vital signs and the nursing notes.  Pertinent labs & imaging results that were available during my care of the patient were reviewed by me and considered in my medical decision making (see chart for details).        Patient seen and examined. Work-up initiated. Medications ordered.  Patient's pain is well controlled on initial exam.  Vital signs reviewed and are as follows: BP (!) 140/121 (BP Location: Right Arm)    Pulse 73    Temp 98.2 F (36.8 C) (Oral)    Resp 14    Ht 5\' 2"  (1.575 m)    Wt 74.8 kg  SpO2 94%    BMI 30.18 kg/m   X-ray images personally reviewed and interpreted.    Patient updated on results.  Patient discussed with and seen by Dr. Francia Greaves.   Patient required additional doses of pain medication.  She was able to ambulate very slowly with assistance.  She was able to transfer to a wheelchair at bedside, again with assistance.  Patient states that she has a walker and a rolling walker at home.  She has the assistance of her husband who can help her.  She is requesting prescription for tramadol.  She has Zanaflex at home.  We will also give prescription for Lidoderm patch.  Patient voices motivation to go home and does not want to stay in the hospital.  Patient encouraged to follow-up with her doctor for recheck and to ensure improvement  in symptoms.  If she has any worsening symptoms, she should return to the emergency department or call her doctor.  She verbalizes understanding agrees with plan.   Final Clinical Impressions(s) / ED Diagnoses   Final diagnoses:  Right thigh pain  Fall, initial encounter   Patient with right thigh pain after a fall.  She has had previous surgery on her hip and femur.  Fortunately, her x-rays tonight were negative.  Patient had a minor blow to her head but did not have any serious signs of trauma on exam and she is not on any anticoagulation.  No mental status decompensation or headache development during ED stay.  Lower extremities neurovascularly intact.  Patient has significant amount of intermittent spasms in her legs and making it difficult for her to walk.  However she has been able to stand and take a few steps at bedside as well as transfer to wheelchair and she wants to go home.  She seems like she has appropriate resources at home and she feels like she will do well.  Follow-up and return instructions as above.  ED Discharge Orders         Ordered    traMADol (ULTRAM) 50 MG tablet  Every 6 hours PRN,   Status:  Discontinued     09/13/18 2320    lidocaine (LIDODERM) 5 %  Every 24 hours     09/13/18 2320    traMADol (ULTRAM) 50 MG tablet  Every 6 hours PRN     09/13/18 2322           Carlisle Cater, PA-C 09/13/18 2338    Valarie Merino, MD 09/19/18 [phone]

## 2018-09-13 NOTE — ED Notes (Signed)
ED Provider at bedside. 

## 2018-09-13 NOTE — ED Notes (Signed)
Patient transported to X-ray 

## 2018-09-13 NOTE — Discharge Instructions (Signed)
Please read and follow all provided instructions.  Your diagnoses today include:  1. Fall   2. Right thigh pain   3. Fall, initial encounter     Tests performed today include:  X-rays of your neck, hip, and leg -- no broken bones  Vital signs. See below for your results today.   Medications prescribed:   Tramadol - narcotic-like pain medication  DO NOT drive or perform any activities that require you to be awake and alert because this medicine can make you drowsy.   Use pain medication only under direct supervision at the lowest possible dose needed to control your pain.   Take any prescribed medications only as directed.  Home care instructions:  Follow any educational materials contained in this packet.  BE VERY CAREFUL not to take multiple medicines containing Tylenol (also called acetaminophen). Doing so can lead to an overdose which can damage your liver and cause liver failure and possibly death.   Follow-up instructions: Please follow-up with your primary care provider in the next 2 days for further evaluation of your symptoms.   Return instructions:   Please return to the Emergency Department if you experience worsening symptoms.   Please return if you have any other emergent concerns.  Additional Information:  Your vital signs today were: BP (!) 136/56    Pulse 69    Temp 98.2 F (36.8 C) (Oral)    Resp 14    Ht 5\' 2"  (1.575 m)    Wt 74.8 kg    SpO2 98%    BMI 30.18 kg/m  If your blood pressure (BP) was elevated above 135/85 this visit, please have this repeated by your doctor within one month. --------------

## 2018-09-16 ENCOUNTER — Emergency Department (HOSPITAL_COMMUNITY): Payer: Medicare Other

## 2018-09-16 ENCOUNTER — Emergency Department (HOSPITAL_COMMUNITY)
Admission: EM | Admit: 2018-09-16 | Discharge: 2018-09-28 | Disposition: E | Payer: Medicare Other | Attending: Emergency Medicine | Admitting: Emergency Medicine

## 2018-09-16 DIAGNOSIS — I10 Essential (primary) hypertension: Secondary | ICD-10-CM | POA: Insufficient documentation

## 2018-09-16 DIAGNOSIS — Z79899 Other long term (current) drug therapy: Secondary | ICD-10-CM | POA: Diagnosis not present

## 2018-09-16 DIAGNOSIS — I469 Cardiac arrest, cause unspecified: Secondary | ICD-10-CM | POA: Insufficient documentation

## 2018-09-16 DIAGNOSIS — Z853 Personal history of malignant neoplasm of breast: Secondary | ICD-10-CM | POA: Diagnosis not present

## 2018-09-16 DIAGNOSIS — R0602 Shortness of breath: Secondary | ICD-10-CM | POA: Diagnosis present

## 2018-09-16 LAB — CBC WITH DIFFERENTIAL/PLATELET
Abs Immature Granulocytes: 0 10*3/uL (ref 0.00–0.07)
Basophils Absolute: 0 10*3/uL (ref 0.0–0.1)
Basophils Relative: 0 %
Eosinophils Absolute: 0 10*3/uL (ref 0.0–0.5)
Eosinophils Relative: 0 %
HCT: 31.4 % — ABNORMAL LOW (ref 36.0–46.0)
Hemoglobin: 8.3 g/dL — ABNORMAL LOW (ref 12.0–15.0)
Lymphocytes Relative: 57 %
Lymphs Abs: 8.3 10*3/uL — ABNORMAL HIGH (ref 0.7–4.0)
MCH: 30.5 pg (ref 26.0–34.0)
MCHC: 26.4 g/dL — ABNORMAL LOW (ref 30.0–36.0)
MCV: 115.4 fL — ABNORMAL HIGH (ref 80.0–100.0)
Monocytes Absolute: 0.6 10*3/uL (ref 0.1–1.0)
Monocytes Relative: 4 %
Neutro Abs: 5.7 10*3/uL (ref 1.7–7.7)
Neutrophils Relative %: 39 %
Platelets: 253 10*3/uL (ref 150–400)
RBC: 2.72 MIL/uL — ABNORMAL LOW (ref 3.87–5.11)
RDW: 14.3 % (ref 11.5–15.5)
WBC: 14.5 10*3/uL — ABNORMAL HIGH (ref 4.0–10.5)
nRBC: 0.6 % — ABNORMAL HIGH (ref 0.0–0.2)

## 2018-09-16 LAB — POCT I-STAT EG7
Acid-base deficit: 27 mmol/L — ABNORMAL HIGH (ref 0.0–2.0)
Bicarbonate: 7 mmol/L — ABNORMAL LOW (ref 20.0–28.0)
Calcium, Ion: 0.96 mmol/L — ABNORMAL LOW (ref 1.15–1.40)
HCT: 26 % — ABNORMAL LOW (ref 36.0–46.0)
Hemoglobin: 8.8 g/dL — ABNORMAL LOW (ref 12.0–15.0)
O2 Saturation: 54 %
Potassium: 7.7 mmol/L (ref 3.5–5.1)
Sodium: 136 mmol/L (ref 135–145)
TCO2: 9 mmol/L — ABNORMAL LOW (ref 22–32)
pCO2, Ven: 51.4 mmHg (ref 44.0–60.0)
pH, Ven: 6.741 — CL (ref 7.250–7.430)
pO2, Ven: 56 mmHg — ABNORMAL HIGH (ref 32.0–45.0)

## 2018-09-16 LAB — COMPREHENSIVE METABOLIC PANEL
ALT: 230 U/L — ABNORMAL HIGH (ref 0–44)
AST: 282 U/L — ABNORMAL HIGH (ref 15–41)
Albumin: 2.2 g/dL — ABNORMAL LOW (ref 3.5–5.0)
Alkaline Phosphatase: 60 U/L (ref 38–126)
Anion gap: 19 — ABNORMAL HIGH (ref 5–15)
BUN: 35 mg/dL — ABNORMAL HIGH (ref 8–23)
CO2: 7 mmol/L — ABNORMAL LOW (ref 22–32)
Calcium: 7.4 mg/dL — ABNORMAL LOW (ref 8.9–10.3)
Chloride: 111 mmol/L (ref 98–111)
Creatinine, Ser: 2.13 mg/dL — ABNORMAL HIGH (ref 0.44–1.00)
GFR calc Af Amer: 25 mL/min — ABNORMAL LOW (ref >60)
GFR calc non Af Amer: 21 mL/min — ABNORMAL LOW (ref >60)
Glucose, Bld: 249 mg/dL — ABNORMAL HIGH (ref 70–99)
Potassium: >7.5 mmol/L (ref 3.5–5.1)
Sodium: 137 mmol/L (ref 135–145)
Total Bilirubin: 0.6 mg/dL (ref 0.3–1.2)
Total Protein: 4.9 g/dL — ABNORMAL LOW (ref 6.5–8.1)

## 2018-09-16 LAB — TROPONIN I: Troponin I: 0.16 ng/mL (ref <0.03)

## 2018-09-16 LAB — I-STAT CREATININE, ED: Creatinine, Ser: 2 mg/dL — ABNORMAL HIGH (ref 0.44–1.00)

## 2018-09-16 MED ORDER — EPINEPHRINE PF 1 MG/ML IJ SOLN
0.5000 ug/min | INTRAVENOUS | Status: DC
  Administered 2018-09-16: 0.5 ug/min via INTRAVENOUS
  Filled 2018-09-16: qty 4

## 2018-09-18 LAB — PATHOLOGIST SMEAR REVIEW

## 2018-09-28 NOTE — ED Notes (Signed)
x1 atropine given

## 2018-09-28 NOTE — ED Notes (Signed)
x1 epi given

## 2018-09-28 NOTE — ED Notes (Addendum)
Pt intubated, CPR in progress

## 2018-09-28 NOTE — ED Triage Notes (Signed)
Pt brought in by [facility] from home for sudden onset SOB after sitting on the toilet. Per EMS pt was A+Ox4, skin pink, warm, and dry. Pt had fall x3 days ago, per EMS pt had been in bed since the fall. Pt arrested during transport, x1 round of CPR and epi with ROSC. Per EMS pt HR from 120 to bradycardic during transport. Pt given .4 narcan without relief.

## 2018-09-28 NOTE — ED Notes (Signed)
Pulse check, pt asystole on the monitor

## 2018-09-28 NOTE — ED Notes (Signed)
Rhythm check- pt bradycardic at 35

## 2018-09-28 NOTE — ED Notes (Signed)
1mg atropine given

## 2018-09-28 NOTE — ED Notes (Signed)
CPR continued. 

## 2018-09-28 NOTE — ED Notes (Signed)
Give x1 epi

## 2018-09-28 NOTE — ED Notes (Signed)
Time of death announced 1645 by EDP Dr.Miller

## 2018-09-28 NOTE — ED Notes (Signed)
Pulse check, pt bradycardic at 36, no pulses palpable, central line placed

## 2018-09-28 NOTE — ED Notes (Signed)
Pulse check, asystole on monitor

## 2018-09-28 NOTE — ED Notes (Signed)
Femoral pulses noted

## 2018-09-28 NOTE — ED Provider Notes (Signed)
[facility] EMERGENCY DEPARTMENT Provider Note   CSN: [id number] Arrival date & time: Oct 08, 2018  1611    History   Chief Complaint Chief Complaint  Patient presents with   Respiratory Arrest   Post Arrest    HPI Caitlin [name] is a 78 y.o. female.     HPI  The patient is an unfortunate 78 year old female, she has a history of breast cancer, history of pulmonary embolism in the past per the medical record related to both cancer as well as she has had orthopedic injuries and surgeries.  She arrives by paramedic transport after having acute onset of shortness of breath just prior to arrival which was described by her husband when she got up to go use the bathroom, she came back to the bed and was extremely dyspneic.  3 days ago she had a fall on her hip, she had x-ray imaging which was negative for fracture but over the last couple of days has required increased amounts of assistance ambulating around the house and so she is spent most of the time in the bed.  There was no coughing or fevers, no swelling of the legs.  In route to the hospital the patient's mental status change from acute alert and oriented x4 to totally unresponsive in a cardiac and pulmonary arrest.  She was quickly resuscitated and given supportive oxygenation by bag-valve-mask as she still had a slight respiratory effort.  She had return of spontaneous circulation after having a cardiac arrest twice in the back of the ambulance.  On arrival she has a weak carotid pulse, it is bradycardic, level 5 caveat applies secondary to altered mental status and acuity of condition  Past Medical History:  Diagnosis Date   Anemia    Arthritis    Bladder absent Pt had bladder removed in 2010   Bladder cancer ([place])    Breast cancer ([place]) 1985   left breast, lumpectomy and radiation   Chronic kidney disease    does not empty bladder,self cath, Stage 3 CKD   Complication of anesthesia    GERD  (gastroesophageal reflux disease)    History of hiatal hernia    History of radiation therapy 05/02/16-06/07/16   pelvis 55 Gy in 25 fractions   Hypertension    IBS (irritable bowel syndrome)    more diarrhea than constipation   Mitral valve prolapse    Personal history of radiation therapy 2005   PONV (postoperative nausea and vomiting)    Pulmonary embolus ([place])    x 2 after surgery January 2019   Restless legs    Self-catheterizes urinary bladder     Patient Active Problem List   Diagnosis Date Noted   Retained orthopedic hardware 12/08/2017   Stress fracture of right femur 12/07/2017   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   Arthritis 07/03/2017   DVT (deep venous thrombosis) ([place]) 07/03/2017   Pulmonary embolism ([place]) 06/30/2017   Closed left subtrochanteric femur fracture ([place]) 06/22/2017   Femur fracture ([place]) 06/21/2017   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[place]) 04/01/2016   Urethral caruncle 01/26/2016   PAC (premature atrial contraction) 11/29/2012   HTN (hypertension) 10/10/2012    Past Surgical History:  Procedure Laterality Date   ABDOMINAL HYSTERECTOMY  1980   total   ANTERIOR AND POSTERIOR REPAIR N/A 01/25/2016   Procedure: Excision of suburethral mass;  Surgeon: Irine Seal, MD;  Location: [facility];  Service: Urology;  Laterality: N/A;   APPENDECTOMY  BLADDER REMOVAL     had neo bladder put in   BLADDER SURGERY  2009   due to cancer   BREAST BIOPSY Left 2005   BREAST LUMPECTOMY Left 2005   BREAST SURGERY  2005   Left Breast Lumpectomy   CHOLECYSTECTOMY N/A 02/19/2013   Procedure: LAPAROSCOPIC CHOLECYSTECTOMY WITH INTRAOPERATIVE CHOLANGIOGRAM;  Surgeon: Ralene Ok, MD;  Location: [facility];  Service: General;  Laterality: N/A;   COLONOSCOPY     FEMUR IM NAIL Left 06/21/2017   Procedure: INTRAMEDULLARY (IM) NAIL FEMORAL;  Surgeon: Dorna Leitz, MD;  Location: [facility];  Service: Orthopedics;   Laterality: Left;   FEMUR IM NAIL Bilateral 12/08/2017   Procedure: INTRAMEDULLARY (IM) RETROGRADE FEMORAL NAILING RIGHT PROXIMAL FEMUR,REMOVAL INTERLOCKING SCREW ON LEFT HIP;  Surgeon: Dorna Leitz, MD;  Location: [facility];  Service: Orthopedics;  Laterality: Bilateral;   IR GENERIC HISTORICAL  05/03/2016   IR FLUORO GUIDE CV LINE RIGHT 05/03/2016 Corrie Mckusick, DO [facility]   IR GENERIC HISTORICAL  05/03/2016   IR US GUIDE VASC ACCESS RIGHT 05/03/2016 Corrie Mckusick, DO [facility]   IR GENERIC HISTORICAL  05/31/2016   IR FLUORO GUIDE CV LINE LEFT 05/31/2016 Sandi Mariscal, MD [facility]   IR GENERIC HISTORICAL  05/31/2016   IR US GUIDE VASC ACCESS LEFT 05/31/2016 Sandi Mariscal, MD [facility]   REPLACEMENT TOTAL KNEE Left    TONSILLECTOMY       OB History   No obstetric history on file.      Home Medications    Prior to Admission medications   Medication Sig Start Date End Date Taking? Authorizing Provider  acetaminophen (TYLENOL) 500 MG tablet Take 1,000 mg by mouth daily.     [provider]  Calcium-Magnesium-Vitamin D (CALCIUM 1200+D3 PO) Take 1,200 mg by mouth daily.    [provider]  cetirizine (ZYRTEC) 10 MG tablet Take 10 mg by mouth daily.    [provider]  cholecalciferol (VITAMIN D) 1000 units tablet Take 1 tablet by mouth.     [provider]  enoxaparin (LOVENOX) 30 MG/0.3ML injection Inject 0.3 mLs (30 mg total) into the skin daily for 5 days. 12/09/17 12/14/17  Grier Mitts, PA-C  escitalopram (LEXAPRO) 20 MG tablet Take 20 mg by mouth daily.     [provider]  fluticasone (FLONASE) 50 MCG/ACT nasal spray Place 1 spray into the nose daily as needed for allergies.  06/22/12   [provider]  furosemide (LASIX) 20 MG tablet Take 20 mg by mouth daily. 06/29/17   [provider]  HYDROcodone-acetaminophen (NORCO/VICODIN) 5-325 MG tablet Take 1-2 tablets by mouth every 4 (four) hours as needed for moderate  pain. 12/09/17   Grier Mitts, PA-C  lidocaine (LIDODERM) 5 % Place 1 patch onto the skin daily. Remove & Discard patch within 12 hours or as directed by MD 09/13/18   Carlisle Cater, PA-C  loperamide (IMODIUM) 2 MG capsule Take 2 mg by mouth as needed for diarrhea or loose stools.    [provider]  Menthol, Topical Analgesic, (BENGAY ULTRA STRENGTH EX) Apply 1 application topically as needed (hip pain).    [provider]  omeprazole (PRILOSEC) 20 MG capsule Take 20 mg by mouth daily.  06/25/12   [provider]  potassium chloride (K-DUR) 10 MEQ tablet Take 10 mEq by mouth daily.  07/06/17   [provider]  prochlorperazine (COMPAZINE) 10 MG tablet Take 1 tablet (10 mg total) by mouth every 6 (six)  hours as needed for nausea or vomiting. 05/03/16   Ladell Pier, MD  Rivaroxaban 15 & 20 MG TBPK Start with one 64m tablet by mouth twice a day with food. On Day 22, switch to one 266mtablet once a day with food. 07/03/17   ChDessa PhiDO  tiZANidine (ZANAFLEX) 2 MG tablet Take 2 mg by mouth as needed for muscle spasms.  09/21/17   [provider]  traMADol (ULTRAM) 50 MG tablet Take 1 tablet (50 mg total) by mouth every 6 (six) hours as needed. 09/13/18   GeCarlisle CaterPA-C  VITAMIN B1-B12 IJ Inject as directed every 30 (thirty) days.    [provider]  warfarin (COUMADIN) 2.5 MG tablet Take 1 tablet (2.5 mg total) by mouth daily at 6 PM. 12/10/17   LaGrier MittsPA-C    Family History Family History  Problem Relation Age of Onset   Stroke Mother    Stroke Father    Asthma Sister     Social History Social History   Tobacco Use   Smoking status: Never Smoker   Smokeless tobacco: Never Used  Substance Use Topics   Alcohol use: No   Drug use: No     Allergies   Patient has no known allergies.   Review of Systems Review of Systems  Unable to perform ROS: Patient unresponsive     Physical Exam Updated  Vital Signs There were no vitals taken for this visit.  Physical Exam Constitutional:      Comments: Unresponsive, ill-appearing  HENT:     Head:     Comments: Atraumatic    Mouth/Throat:     Comments: Frothy sputum Eyes:     Comments: Pupils 4 mm unreactive  Neck:     Comments: Weak carotid pulse Cardiovascular:     Comments: Weak central pulses, no peripheral pulses, pale, bradycardic Pulmonary:     Comments: Breathing 5 or 6 times a minute, minimal respiratory effort Abdominal:     Comments: Soft, nondistended, no palpable masses  Musculoskeletal:     Comments: Symmetrical legs, no obvious edema  Skin:    Comments: Normal-appearing skin, no signs of significant bruising bleeding or lacerations  Neurological:     Comments: Glasgow Coma Score of 3, totally unresponsive      ED Treatments / Results  Labs (all labs ordered are listed, but only abnormal results are displayed) Labs Reviewed  CBC WITH DIFFERENTIAL/PLATELET - Abnormal; Notable for the following components:      Result Value   WBC 14.5 (*)    RBC 2.72 (*)    Hemoglobin 8.3 (*)    HCT 31.4 (*)    MCV 115.4 (*)    MCHC 26.4 (*)    nRBC 0.6 (*)    All other components within normal limits  I-STAT CREATININE, ED - Abnormal; Notable for the following components:   Creatinine, Ser 2.00 (*)    All other components within normal limits  POCT I-STAT EG7 - Abnormal; Notable for the following components:   pH, Ven 6.741 (*)    pO2, Ven 56.0 (*)    Bicarbonate 7.0 (*)    TCO2 9 (*)    Acid-base deficit 27.0 (*)    Potassium 7.7 (*)    Calcium, Ion 0.96 (*)    HCT 26.0 (*)    Hemoglobin 8.8 (*)    All other components within normal limits  TROPONIN I  COMPREHENSIVE METABOLIC PANEL  CBG MONITORING, ED  EKG EKG Interpretation  Date/Time:  Oct 08, 2018 16:20:57 EDT Ventricular Rate:  39 PR Interval:    QRS Duration: 143 QT Interval:  476 QTC Calculation: 384 R Axis:   84 Text  Interpretation:  Junctional rhythm Right bundle branch block Borderline ST depression, lateral leads Since last tracing rate slower Confirmed by Noemi Chapel [phone]) on 10/08/2018 4:55:05 PM   Radiology Dg Chest Portable 1 View  Result Date: 10-08-18 CLINICAL DATA:  Patient intubated. Respiratory failure. CPR at bedside. EXAM: PORTABLE CHEST 1 VIEW COMPARISON:  June 30, 2017 FINDINGS: The ETT is in good position. The heart, hila, mediastinum, lungs, and pleura are unremarkable. IMPRESSION: The ETT is in good position.  No acute abnormalities noted. Electronically Signed   By: Dorise Bullion III M.D   On: 10/08/2018 16:43    Procedures Procedure Name: Intubation Date/Time: 10/08/18 4:56 PM Performed by: Noemi Chapel, MD Pre-anesthesia Checklist: Patient identified, Patient being monitored, Emergency Drugs available, Timeout performed and Suction available Oxygen Delivery Method: Non-rebreather mask Preoxygenation: Pre-oxygenation with 100% oxygen Induction Type: Rapid sequence Ventilation: Mask ventilation without difficulty Laryngoscope Size: Mac and 4 Tube size: 7.0 mm Number of attempts: 1 Airway Equipment and Method: Stylet Placement Confirmation: ETT inserted through vocal cords under direct vision,  CO2 detector and Breath sounds checked- equal and bilateral Secured at: 24 cm Dental Injury: Teeth and Oropharynx as per pre-operative assessment  Difficulty Due To: Difficulty was anticipated Comments:       .Central Line Date/Time: Oct 08, 2018 4:57 PM Performed by: Noemi Chapel, MD Authorized by: Noemi Chapel, MD   Consent:    Consent obtained:  Emergent situation Pre-procedure details:    Hand hygiene: Hand hygiene performed prior to insertion     Sterile barrier technique: All elements of maximal sterile technique followed     Skin preparation:  ChloraPrep   Skin preparation agent: Skin preparation agent completely dried prior to procedure   Anesthesia (see MAR  for exact dosages):    Anesthesia method:  None Procedure details:    Location:  R femoral   Patient position:  Flat   Procedural supplies:  Triple lumen   Catheter size:  7 Fr   Landmarks identified: yes     Ultrasound guidance: no     Number of attempts:  1   Successful placement: yes   Post-procedure details:    Post-procedure:  Dressing applied and line sutured   Assessment:  Blood return through all ports and free fluid flow   Patient tolerance of procedure:  Tolerated well, no immediate complications Comments:        .Critical Care Performed by: Noemi Chapel, MD Authorized by: Noemi Chapel, MD   Critical care provider statement:    Critical care time (minutes):  35   Critical care time was exclusive of:  Separately billable procedures and treating other patients and teaching time   Critical care was time spent personally by me on the following activities:  Blood draw for specimens, development of treatment plan with patient or surrogate, discussions with consultants, evaluation of patient's response to treatment, examination of patient, obtaining history from patient or surrogate, ordering and performing treatments and interventions, ordering and review of laboratory studies, ordering and review of radiographic studies, pulse oximetry, re-evaluation of patient's condition and review of old charts    CPR:  Personally directed CPR for the majority of the resuscitation.    Medications Ordered in ED Medications  EPINEPHrine (ADRENALIN) 4 mg in dextrose 5 % 250  mL (0.016 mg/mL) infusion (0.5 mcg/min Intravenous New Bag/Given 10-16-2018 1643)     Initial Impression / Assessment and Plan / ED Course  I have reviewed the triage vital signs and the nursing notes.  Pertinent labs & imaging results that were available during my care of the patient were reviewed by me and considered in my medical decision making (see chart for details).        In the course of this  resuscitation from cardiac arrest the patient was quickly treated with epinephrine and atropine which seemed to help for a couple of minutes however she then again went into cardiac arrest and for the next 30 minutes was continued and CPR with intermittent boluses of epinephrine and atropine, continuous CPR as I directed, I was able to intubate the patient for definitive airway and oxygenation and was able to place a central line in the right femoral vein.  The patient was eventually placed on an epinephrine drip however there was no return of spontaneous circulation and after 50 minutes since her last cardiac arrest I discussed her care with her husband and told him about her grave condition.  The decision was made to withdraw care, time of death at 4:45 PM.  I suspect this was related to a primary cardiac event or pulmonary embolism.  CBC showed that her hemoglobin was 8.3, this is 3 g lower than the previous visit on April 16.  The creatinine was at 2.0 just slightly worse than usual.  I personally viewed and interpreted her chest x-ray which showed good position of the endotracheal tube, there was no other abnormality seen.  Her EKG reveals the patient has a wider looking QRS, she was very quickly bradycardic and in asystole.  During the course of the resuscitation the patient went into intermittent complete heart block and ultimately asystolic.  There was no return of spontaneous circulation.  Unfortunately this patient had a life ending illness and the decision was made to withdraw care at 4:45 PM.  We will discussed with medical examiner.  Discussed with Izora Ribas, the medical examiner who agrees that this is not a medical examiner's case, I will sign the death certificate.  Final Clinical Impressions(s) / ED Diagnoses   Final diagnoses:  Cardiac arrest [facility])    ED Discharge Orders    None       Noemi Chapel, MD 10/16/2018 1714

## 2018-09-28 NOTE — ED Notes (Addendum)
1mg  atropine given, CPR in progress

## 2018-09-28 NOTE — ED Notes (Signed)
x1 epi given, CPR continued

## 2018-09-28 NOTE — ED Notes (Signed)
CPR paused, xray performed

## 2018-09-28 NOTE — Progress Notes (Signed)
Responded to page to go to ED for family spiritual support. Family was in Consult B. I offered spiritual care with words of comfort, and ministry of presence. Family waiting to see PT. Family gave me contact information to give to Nurse. Family was thankful for the Chaplain presence.  Chaplain Fidel Levy  [phone]

## 2018-09-28 DEATH — deceased

## 2018-10-08 ENCOUNTER — Ambulatory Visit: Payer: Self-pay | Admitting: Radiation Oncology

## 2018-11-05 ENCOUNTER — Ambulatory Visit: Payer: Medicare Other | Admitting: Oncology
